# Patient Record
Sex: Male | Born: 1955 | Race: White | Hispanic: No | Marital: Married | Smoking: Former smoker
Health system: Southern US, Community
[De-identification: ages and names within clinical notes are randomized; demographics above are authoritative.]

## PROBLEM LIST (undated history)

## (undated) DIAGNOSIS — J449 Chronic obstructive pulmonary disease, unspecified: Secondary | ICD-10-CM

## (undated) DIAGNOSIS — R5382 Chronic fatigue, unspecified: Secondary | ICD-10-CM

## (undated) DIAGNOSIS — R42 Dizziness and giddiness: Secondary | ICD-10-CM

## (undated) DIAGNOSIS — D72829 Elevated white blood cell count, unspecified: Secondary | ICD-10-CM

## (undated) DIAGNOSIS — E785 Hyperlipidemia, unspecified: Secondary | ICD-10-CM

## (undated) DIAGNOSIS — I10 Essential (primary) hypertension: Secondary | ICD-10-CM

## (undated) DIAGNOSIS — T8859XA Other complications of anesthesia, initial encounter: Secondary | ICD-10-CM

## (undated) DIAGNOSIS — J189 Pneumonia, unspecified organism: Secondary | ICD-10-CM

## (undated) DIAGNOSIS — Z972 Presence of dental prosthetic device (complete) (partial): Secondary | ICD-10-CM

## (undated) DIAGNOSIS — I2510 Atherosclerotic heart disease of native coronary artery without angina pectoris: Secondary | ICD-10-CM

## (undated) DIAGNOSIS — R0600 Dyspnea, unspecified: Secondary | ICD-10-CM

## (undated) DIAGNOSIS — I219 Acute myocardial infarction, unspecified: Secondary | ICD-10-CM

## (undated) DIAGNOSIS — I509 Heart failure, unspecified: Secondary | ICD-10-CM

## (undated) HISTORY — DX: Atherosclerotic heart disease of native coronary artery without angina pectoris: I25.10

## (undated) HISTORY — PX: APPENDECTOMY: SHX54

## (undated) HISTORY — DX: Chronic fatigue, unspecified: R53.82

## (undated) HISTORY — PX: COLONOSCOPY: SHX174

## (undated) HISTORY — DX: Essential (primary) hypertension: I10

## (undated) HISTORY — PX: HERNIA REPAIR: SHX51

## (undated) HISTORY — DX: Elevated white blood cell count, unspecified: D72.829

## (undated) HISTORY — PX: NECK SURGERY: SHX720

## (undated) HISTORY — DX: Hyperlipidemia, unspecified: E78.5

---

## 1999-07-06 ENCOUNTER — Ambulatory Visit (HOSPITAL_COMMUNITY): Admission: RE | Admit: 1999-07-06 | Discharge: 1999-07-06 | Payer: Self-pay | Admitting: Neurosurgery

## 1999-07-06 ENCOUNTER — Encounter: Payer: Self-pay | Admitting: Neurosurgery

## 2000-06-13 ENCOUNTER — Encounter: Payer: Self-pay | Admitting: Emergency Medicine

## 2000-06-13 ENCOUNTER — Inpatient Hospital Stay (HOSPITAL_COMMUNITY): Admission: EM | Admit: 2000-06-13 | Discharge: 2000-06-15 | Payer: Self-pay | Admitting: Emergency Medicine

## 2000-08-28 ENCOUNTER — Ambulatory Visit (HOSPITAL_COMMUNITY): Admission: RE | Admit: 2000-08-28 | Discharge: 2000-08-28 | Payer: Self-pay | Admitting: Cardiology

## 2001-01-22 ENCOUNTER — Encounter: Payer: Self-pay | Admitting: Emergency Medicine

## 2001-01-22 ENCOUNTER — Inpatient Hospital Stay (HOSPITAL_COMMUNITY): Admission: EM | Admit: 2001-01-22 | Discharge: 2001-01-25 | Payer: Self-pay | Admitting: Emergency Medicine

## 2002-02-16 ENCOUNTER — Inpatient Hospital Stay (HOSPITAL_COMMUNITY): Admission: EM | Admit: 2002-02-16 | Discharge: 2002-03-06 | Payer: Self-pay

## 2002-02-16 ENCOUNTER — Encounter: Payer: Self-pay | Admitting: *Deleted

## 2002-02-17 ENCOUNTER — Encounter: Payer: Self-pay | Admitting: Cardiology

## 2002-02-18 ENCOUNTER — Encounter: Payer: Self-pay | Admitting: Cardiothoracic Surgery

## 2002-02-19 ENCOUNTER — Encounter: Payer: Self-pay | Admitting: Cardiothoracic Surgery

## 2002-02-20 ENCOUNTER — Encounter: Payer: Self-pay | Admitting: Cardiothoracic Surgery

## 2002-02-21 ENCOUNTER — Encounter (INDEPENDENT_AMBULATORY_CARE_PROVIDER_SITE_OTHER): Payer: Self-pay | Admitting: *Deleted

## 2002-02-21 ENCOUNTER — Encounter: Payer: Self-pay | Admitting: Cardiothoracic Surgery

## 2002-02-22 ENCOUNTER — Encounter: Payer: Self-pay | Admitting: Pulmonary Disease

## 2002-02-22 ENCOUNTER — Encounter: Payer: Self-pay | Admitting: Cardiothoracic Surgery

## 2002-02-22 HISTORY — PX: CORONARY ARTERY BYPASS GRAFT: SHX141

## 2002-02-23 ENCOUNTER — Encounter: Payer: Self-pay | Admitting: Pulmonary Disease

## 2002-02-23 ENCOUNTER — Encounter: Payer: Self-pay | Admitting: Cardiothoracic Surgery

## 2002-02-24 ENCOUNTER — Encounter: Payer: Self-pay | Admitting: Pulmonary Disease

## 2002-02-25 ENCOUNTER — Encounter: Payer: Self-pay | Admitting: Surgery

## 2002-02-25 ENCOUNTER — Encounter: Payer: Self-pay | Admitting: Pulmonary Disease

## 2002-02-26 ENCOUNTER — Encounter: Payer: Self-pay | Admitting: Pulmonary Disease

## 2002-02-27 ENCOUNTER — Encounter: Payer: Self-pay | Admitting: Cardiothoracic Surgery

## 2002-02-28 ENCOUNTER — Encounter: Payer: Self-pay | Admitting: Cardiothoracic Surgery

## 2002-03-01 ENCOUNTER — Encounter: Payer: Self-pay | Admitting: Cardiothoracic Surgery

## 2002-03-02 ENCOUNTER — Encounter: Payer: Self-pay | Admitting: Cardiothoracic Surgery

## 2002-03-03 ENCOUNTER — Encounter (INDEPENDENT_AMBULATORY_CARE_PROVIDER_SITE_OTHER): Payer: Self-pay | Admitting: *Deleted

## 2002-03-24 ENCOUNTER — Encounter: Admission: RE | Admit: 2002-03-24 | Discharge: 2002-03-24 | Payer: Self-pay | Admitting: Cardiothoracic Surgery

## 2002-03-24 ENCOUNTER — Encounter: Payer: Self-pay | Admitting: Cardiothoracic Surgery

## 2002-04-12 ENCOUNTER — Encounter (HOSPITAL_COMMUNITY): Admission: RE | Admit: 2002-04-12 | Discharge: 2002-05-30 | Payer: Self-pay | Admitting: *Deleted

## 2002-06-10 ENCOUNTER — Encounter: Payer: Self-pay | Admitting: Emergency Medicine

## 2002-06-10 ENCOUNTER — Inpatient Hospital Stay (HOSPITAL_COMMUNITY): Admission: EM | Admit: 2002-06-10 | Discharge: 2002-06-14 | Payer: Self-pay

## 2002-06-12 ENCOUNTER — Encounter: Payer: Self-pay | Admitting: Cardiovascular Disease

## 2002-07-24 ENCOUNTER — Encounter: Payer: Self-pay | Admitting: *Deleted

## 2002-07-24 ENCOUNTER — Emergency Department (HOSPITAL_COMMUNITY): Admission: EM | Admit: 2002-07-24 | Discharge: 2002-07-24 | Payer: Self-pay | Admitting: Emergency Medicine

## 2002-12-01 ENCOUNTER — Encounter: Payer: Self-pay | Admitting: *Deleted

## 2002-12-01 ENCOUNTER — Ambulatory Visit (HOSPITAL_COMMUNITY): Admission: RE | Admit: 2002-12-01 | Discharge: 2002-12-01 | Payer: Self-pay | Admitting: *Deleted

## 2003-04-07 ENCOUNTER — Emergency Department (HOSPITAL_COMMUNITY): Admission: EM | Admit: 2003-04-07 | Discharge: 2003-04-07 | Payer: Self-pay | Admitting: Emergency Medicine

## 2003-07-06 ENCOUNTER — Encounter (INDEPENDENT_AMBULATORY_CARE_PROVIDER_SITE_OTHER): Payer: Self-pay | Admitting: Cardiology

## 2003-07-06 ENCOUNTER — Inpatient Hospital Stay (HOSPITAL_COMMUNITY): Admission: EM | Admit: 2003-07-06 | Discharge: 2003-07-08 | Payer: Self-pay | Admitting: *Deleted

## 2004-04-01 ENCOUNTER — Observation Stay (HOSPITAL_COMMUNITY): Admission: EM | Admit: 2004-04-01 | Discharge: 2004-04-02 | Payer: Self-pay

## 2004-09-10 ENCOUNTER — Emergency Department (HOSPITAL_COMMUNITY): Admission: EM | Admit: 2004-09-10 | Discharge: 2004-09-10 | Payer: Self-pay | Admitting: Emergency Medicine

## 2004-11-22 ENCOUNTER — Observation Stay (HOSPITAL_COMMUNITY): Admission: EM | Admit: 2004-11-22 | Discharge: 2004-11-23 | Payer: Self-pay | Admitting: Emergency Medicine

## 2004-11-25 ENCOUNTER — Observation Stay (HOSPITAL_COMMUNITY): Admission: EM | Admit: 2004-11-25 | Discharge: 2004-11-26 | Payer: Self-pay | Admitting: Emergency Medicine

## 2004-12-13 ENCOUNTER — Ambulatory Visit (HOSPITAL_COMMUNITY): Admission: RE | Admit: 2004-12-13 | Discharge: 2004-12-13 | Payer: Self-pay | Admitting: Neurosurgery

## 2004-12-23 ENCOUNTER — Inpatient Hospital Stay (HOSPITAL_COMMUNITY): Admission: RE | Admit: 2004-12-23 | Discharge: 2004-12-24 | Payer: Self-pay | Admitting: Neurosurgery

## 2005-10-21 ENCOUNTER — Observation Stay (HOSPITAL_COMMUNITY): Admission: EM | Admit: 2005-10-21 | Discharge: 2005-10-22 | Payer: Self-pay | Admitting: Emergency Medicine

## 2005-11-21 ENCOUNTER — Ambulatory Visit (HOSPITAL_COMMUNITY): Admission: RE | Admit: 2005-11-21 | Discharge: 2005-11-22 | Payer: Self-pay | Admitting: *Deleted

## 2006-09-23 IMAGING — CT CT ANGIO CHEST
3 of 9 series · 14 of 32 positions shown · IV contrast ([ID] OMNI 300)
Comparison: none

CLINICAL DATA: 48 year old with chest pain and shortness of breath.  History of bypass surgery.
 CT ANGIO CHEST WITH CONTRAST:
TECHNIQUE: Multidetector CT imaging of the chest was performed according to the protocol for detection of pulmonary embolism during IV bolus injection of 120 ml Omnipaque 300.  Coronal and sagittal plane images were also generated.
TECHNIQUE: Multidetector CT imaging of the abdomen was performed during IV bolus injection of 120 ml Omnipaque 300.  Coronal and sagittal plane images were also generated.

[Series 3: aortic disection · axial · 0.64mm/px · z∈[-314,-172]mm · 3 of 173 slices shown]
[im 58/173  lung]
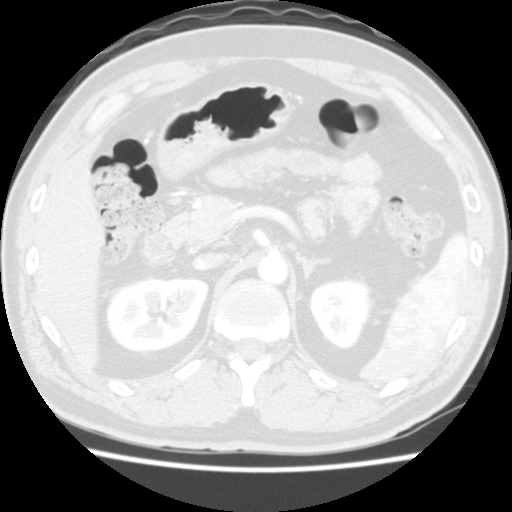
[im 82/173  lung]
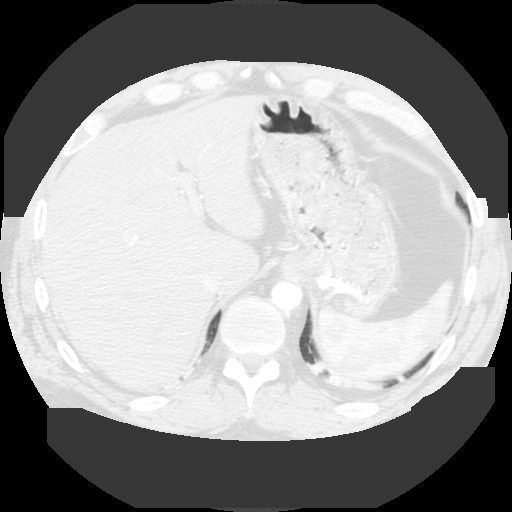
[im 115/173  lung]
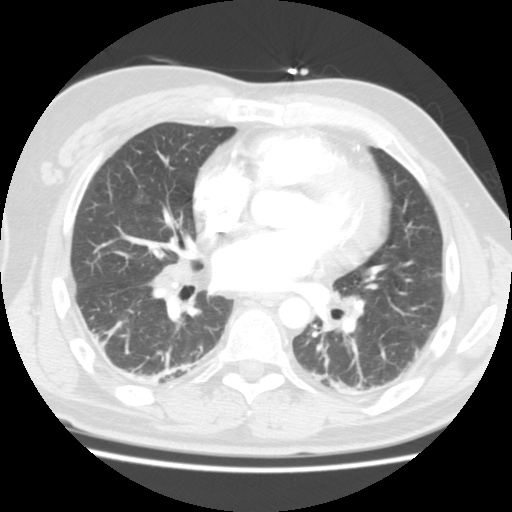

[Series 4: recon 2: aortic disection · axial · 0.64mm/px · z∈[-254,-147]mm · 2 of 97 slices shown]
[im 6/97  lung]
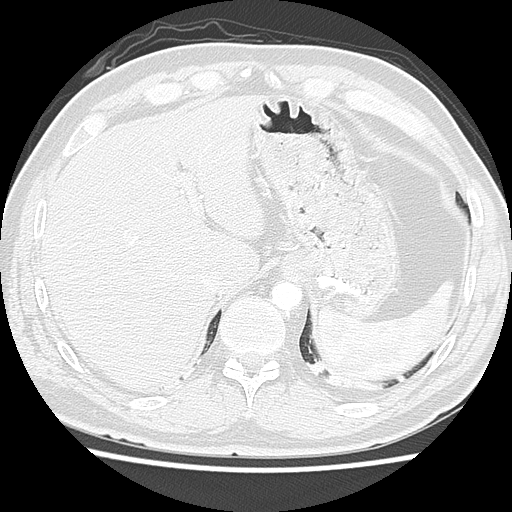
[im 49/97  lung]
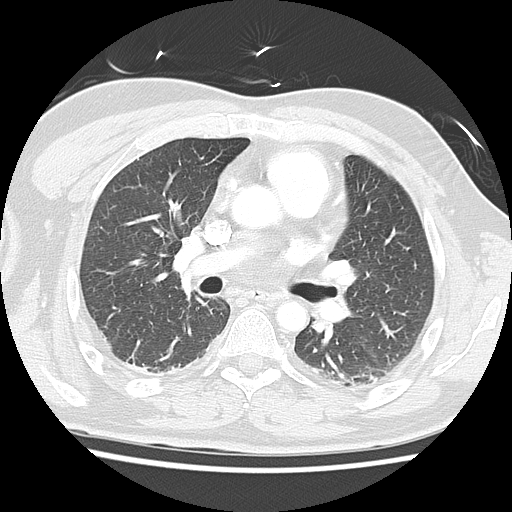

[Series 5: recon 3: aortic disection · axial · 0.64mm/px · z∈[-412,-76]mm · 9 of 347 slices shown]
[im 39/347  lung]
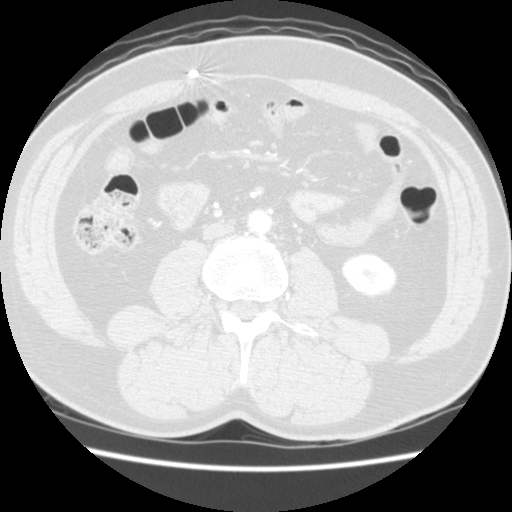
[im 77/347  mediastinal]
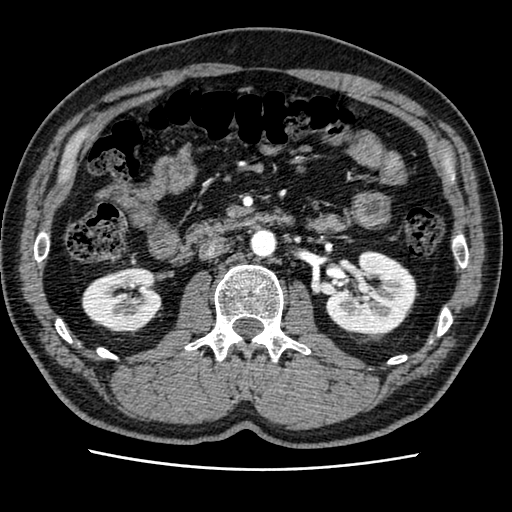
[im 116/347  lung]
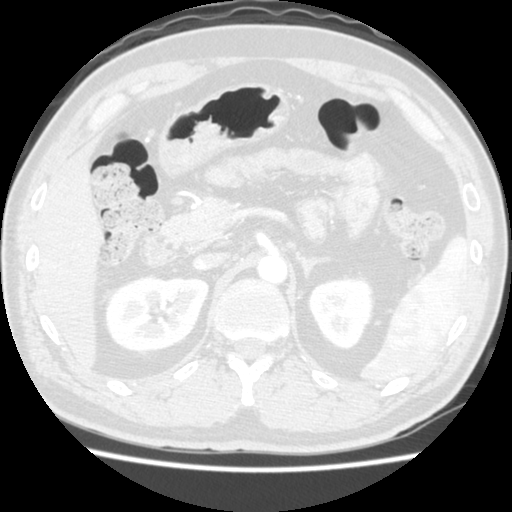
[im 154/347  mediastinal]
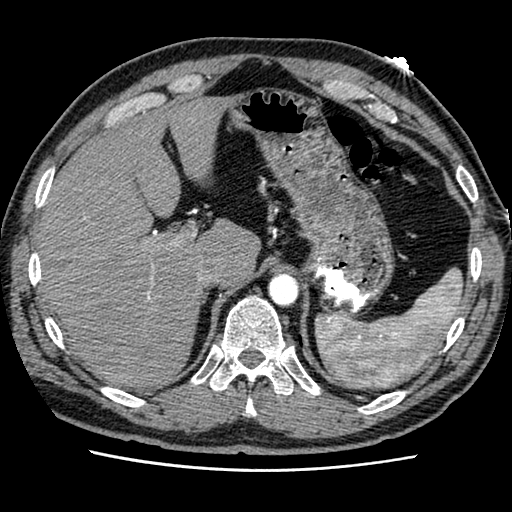
[im 165/347  lung]
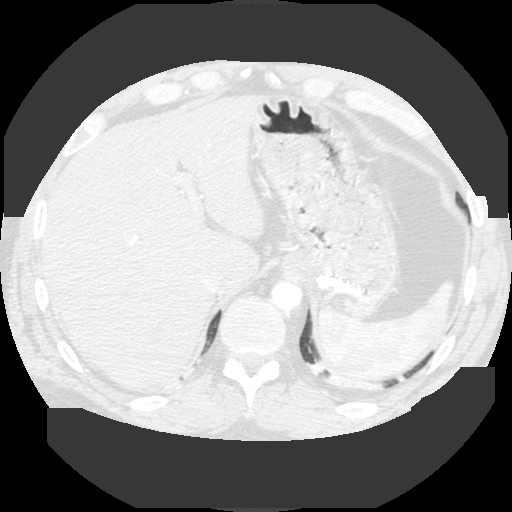
[im 193/347  mediastinal]
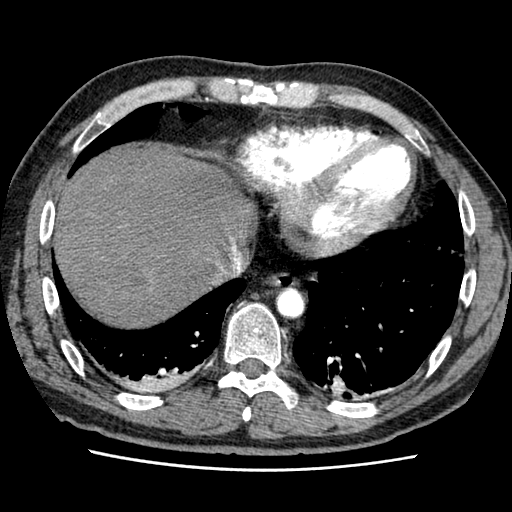
[im 231/347  lung]
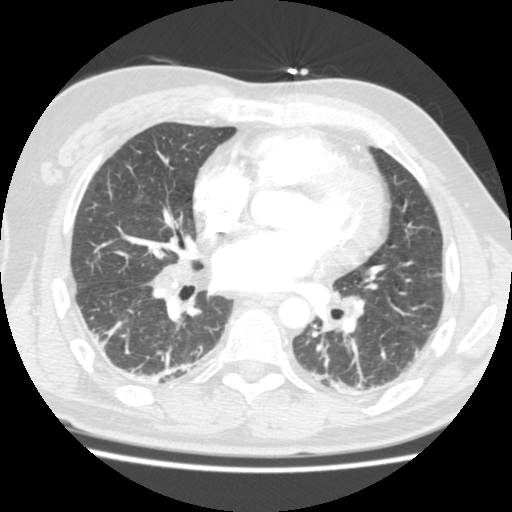
[im 270/347  mediastinal]
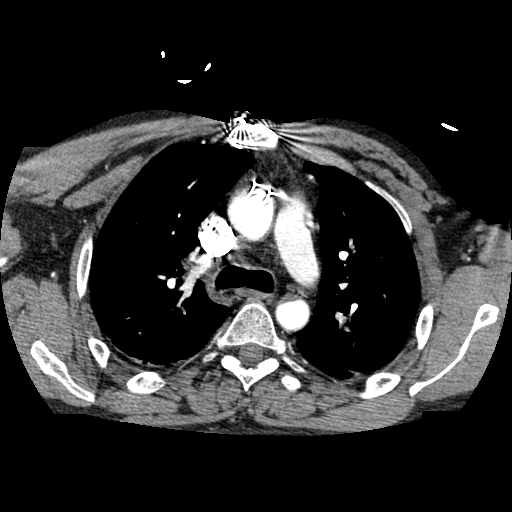
[im 308/347  lung]
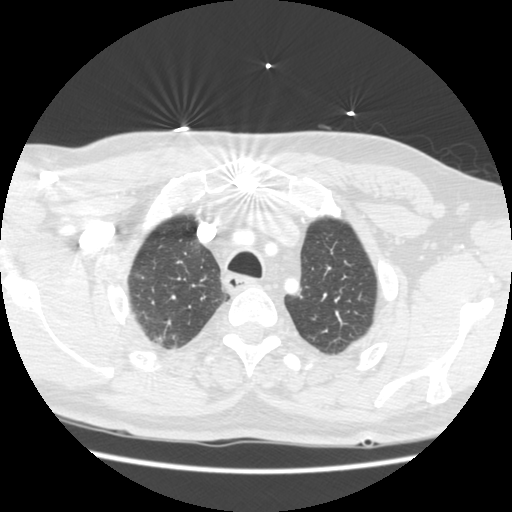

[14 of 32 positions shown; findings below may reference images not displayed]

FINDINGS: The chest wall, soft tissues, and bony structures are unremarkable.  There are surgical changes related to bypass surgery.  The heart is upper limits of normal in size.  No pericardial effusion.  No mediastinal adenopathy.  There are some borderline enlarged right hilar lymph nodes.  The pulmonary artery tree is well opacified.  No filling defects are seen to suggest pulmonary emboli.  The aorta is normal in caliber.  No evidence for aneurysm or dissection.  The esophagus is grossly normal.  Examination of the lung parenchyma demonstrates areas of dependent subpleural atelectasis.  No edema, infiltrates, or effusions.  No pulmonary masses or nodules.
IMPRESSION: 1.  No CT evidence for pulmonary emboli.
 2.  No evidence for aortic aneurysm or dissection.  There is a small area of mural thrombus involving the upper aspect of the descending thoracic aorta.
 3.  Borderline right hilar lymph nodes.
 4.  No significant pulmonary findings.  There is minimal dependent atelectasis.
 CT ANGIO ABDOMEN WITH CONTRAST:
FINDINGS: The aorta is normal in caliber.  No evidence for dissection.  The major branch vessels are normal.  There are single renal arteries bilaterally.  No significant atherosclerotic calcifications are seen.  The liver, spleen, pancreas, adrenal glands, and kidneys demonstrated no significant abnormalities.  The stomach, duodenum, small bowel, and colon are grossly normal but the study is limited without oral contrast.  There is atherosclerotic calcification at the aortic bifurcation and in the proximal iliac arteries but no aneurysm.  A circumaortic left renal vein is noted.
IMPRESSION: 1.  No evidence for abdominal aortic dissection or aneurysm.
 2.  No acute abdominal findings.

## 2007-06-28 ENCOUNTER — Encounter: Admission: RE | Admit: 2007-06-28 | Discharge: 2007-06-28 | Payer: Self-pay | Admitting: *Deleted

## 2007-07-01 ENCOUNTER — Ambulatory Visit (HOSPITAL_COMMUNITY): Admission: RE | Admit: 2007-07-01 | Discharge: 2007-07-01 | Payer: Self-pay | Admitting: *Deleted

## 2008-03-22 ENCOUNTER — Ambulatory Visit (HOSPITAL_COMMUNITY): Admission: RE | Admit: 2008-03-22 | Discharge: 2008-03-23 | Payer: Self-pay | Admitting: Neurosurgery

## 2009-04-13 ENCOUNTER — Encounter: Admission: RE | Admit: 2009-04-13 | Discharge: 2009-04-13 | Payer: Self-pay | Admitting: Family Medicine

## 2009-09-08 ENCOUNTER — Encounter: Payer: Self-pay | Admitting: Cardiovascular Disease

## 2009-09-08 LAB — CONVERTED CEMR LAB
ALT: 32 units/L
AST: 22 units/L
Albumin: 4.9 g/dL
Alkaline Phosphatase: 88 units/L
BUN: 12 mg/dL
CO2: 21 meq/L
Calcium: 9.2 mg/dL
Chloride: 103 meq/L
Cholesterol: 128 mg/dL
Creatinine, Ser: 0.81 mg/dL
Glucose, Bld: 93 mg/dL
HDL: 49 mg/dL
LDL Cholesterol: 57 mg/dL
Potassium: 3.9 meq/L
Sodium: 138 meq/L
Total Bilirubin: 0.6 mg/dL
Total Protein: 7.3 g/dL
Triglyceride fasting, serum: 108 mg/dL

## 2009-09-14 ENCOUNTER — Encounter: Payer: Self-pay | Admitting: Cardiovascular Disease

## 2009-11-06 ENCOUNTER — Telehealth: Payer: Self-pay | Admitting: Cardiovascular Disease

## 2009-11-07 ENCOUNTER — Encounter: Payer: Self-pay | Admitting: Cardiovascular Disease

## 2009-12-04 ENCOUNTER — Telehealth: Payer: Self-pay | Admitting: Cardiovascular Disease

## 2010-01-04 ENCOUNTER — Ambulatory Visit: Payer: Self-pay | Admitting: Cardiovascular Disease

## 2010-01-04 DIAGNOSIS — I25810 Atherosclerosis of coronary artery bypass graft(s) without angina pectoris: Secondary | ICD-10-CM | POA: Insufficient documentation

## 2010-01-04 DIAGNOSIS — I10 Essential (primary) hypertension: Secondary | ICD-10-CM | POA: Insufficient documentation

## 2010-01-04 DIAGNOSIS — E785 Hyperlipidemia, unspecified: Secondary | ICD-10-CM | POA: Insufficient documentation

## 2010-03-07 ENCOUNTER — Telehealth: Payer: Self-pay | Admitting: Cardiovascular Disease

## 2010-04-02 ENCOUNTER — Telehealth: Payer: Self-pay | Admitting: Cardiovascular Disease

## 2010-04-08 ENCOUNTER — Telehealth: Payer: Self-pay | Admitting: Cardiovascular Disease

## 2010-05-09 ENCOUNTER — Telehealth: Payer: Self-pay | Admitting: Cardiovascular Disease

## 2010-05-09 DIAGNOSIS — R079 Chest pain, unspecified: Secondary | ICD-10-CM | POA: Insufficient documentation

## 2010-05-09 DIAGNOSIS — R42 Dizziness and giddiness: Secondary | ICD-10-CM | POA: Insufficient documentation

## 2010-05-09 DIAGNOSIS — R61 Generalized hyperhidrosis: Secondary | ICD-10-CM | POA: Insufficient documentation

## 2010-05-15 ENCOUNTER — Telehealth (INDEPENDENT_AMBULATORY_CARE_PROVIDER_SITE_OTHER): Payer: Self-pay | Admitting: Radiology

## 2010-05-16 ENCOUNTER — Encounter: Payer: Self-pay | Admitting: Internal Medicine

## 2010-05-16 ENCOUNTER — Ambulatory Visit: Payer: Self-pay

## 2010-05-16 ENCOUNTER — Ambulatory Visit: Payer: Self-pay | Admitting: Internal Medicine

## 2010-05-16 ENCOUNTER — Encounter (HOSPITAL_COMMUNITY): Admission: RE | Admit: 2010-05-16 | Discharge: 2010-07-26 | Payer: Self-pay | Admitting: Cardiovascular Disease

## 2010-05-17 ENCOUNTER — Encounter: Payer: Self-pay | Admitting: Cardiovascular Disease

## 2010-06-13 ENCOUNTER — Telehealth: Payer: Self-pay | Admitting: Cardiovascular Disease

## 2010-07-26 ENCOUNTER — Ambulatory Visit: Payer: Self-pay | Admitting: Cardiovascular Disease

## 2010-08-30 ENCOUNTER — Encounter: Payer: Self-pay | Admitting: Cardiovascular Disease

## 2010-08-30 ENCOUNTER — Ambulatory Visit: Admission: RE | Admit: 2010-08-30 | Discharge: 2010-08-30 | Payer: Self-pay | Source: Home / Self Care

## 2010-09-02 LAB — CONVERTED CEMR LAB
ALT: 20 units/L (ref 0–53)
AST: 18 units/L (ref 0–37)
Albumin: 4.8 g/dL (ref 3.5–5.2)
Alkaline Phosphatase: 93 units/L (ref 39–117)
Bilirubin, Direct: 0.2 mg/dL (ref 0.0–0.3)
Cholesterol: 89 mg/dL (ref 0–200)
HDL: 36 mg/dL — ABNORMAL LOW (ref >39)
Indirect Bilirubin: 0.5 mg/dL (ref 0.0–0.9)
LDL Cholesterol: 38 mg/dL (ref 0–99)
Total Bilirubin: 0.7 mg/dL (ref 0.3–1.2)
Total CHOL/HDL Ratio: 2.5
Total Protein: 7.2 g/dL (ref 6.0–8.3)
Triglycerides: 76 mg/dL (ref <150)
VLDL: 15 mg/dL (ref 0–40)

## 2010-09-18 ENCOUNTER — Ambulatory Visit: Payer: Self-pay | Admitting: Internal Medicine

## 2010-09-25 ENCOUNTER — Ambulatory Visit: Payer: Self-pay | Admitting: Internal Medicine

## 2010-09-26 ENCOUNTER — Ambulatory Visit: Payer: Self-pay | Admitting: Physician Assistant

## 2010-09-26 NOTE — Progress Notes (Signed)
Summary: Vytorin   Phone Note Outgoing Call   Call placed by: Benedict Needy, RN,  April 08, 2010 10:24 AM Call placed to: Patient Summary of Call: Recived a call from pt's Pharmacy. Pt currently taking Vytorin 10/40 and Amolodipine 10mg . Due to new FDA guidelines pt needs to switch to crestor 40mg .  LMOM TCB  Initial call taken by: Benedict Needy, RN,  April 08, 2010 10:35 AM [facility] [phone][facility]  Follow-up for Phone Call        Spoke to pt's wife she is aware of mediction change. Will pick up samples and coupon at front desk.  Follow-up by: Benedict Needy, RN,  April 08, 2010 11:09 AM    New/Updated Medications: CRESTOR 40 MG TABS (ROSUVASTATIN CALCIUM) Take 1 tablet by mouth once a day Prescriptions: CRESTOR 40 MG TABS (ROSUVASTATIN CALCIUM) Take 1 tablet by mouth once a day  #30 x 6   Entered by:   Benedict Needy, RN   Authorized by:   Dossie Arbour MD   Signed by:   Benedict Needy, RN on 04/08/2010   Method used:   Electronically to        [facility] [phone]* (retail)       [address]       [place], [place]  [address]       Ph: [phone] or [phone]       Fax: [phone]   RxID:   [id number]

## 2010-09-26 NOTE — Progress Notes (Signed)
Summary: Nuc Pre-Procedure  Phone Note Call from Patient   Caller: Spouse Action Taken: Phone Call Completed Summary of Call: Reviewed information on Myoview Information Sheet (see scanned document for further details).  Spoke with pt's wife. Initial call taken by: Leonia Corona, RT-N,  May 15, 2010 9:50 AM     Nuclear Med Background Indications for Stress Test: Evaluation for Ischemia, Graft Patency   History: CABG, Echo, Heart Catheterization, Myocardial Perfusion Study  History Comments: H/O CABG; Patent grafts by Cath in '08 1/10- MPS- Mod. scar, no ischedia.EF= 46%  Symptoms: Chest Pain, Dizziness  Symptoms Comments: Left arm pain    Nuclear Pre-Procedure Cardiac Risk Factors: Hypertension, Lipids Height (in): 66

## 2010-09-26 NOTE — Progress Notes (Signed)
Summary: REfill  Phone Note Refill Request Message from:  Patient on November 06, 2009 4:52 PM  Patient called Metoperol 12.5 XL [facility] [place])  Initial call taken by: West Carbo,  November 06, 2009 4:52 PM Caller: Patient    New/Updated Medications: TOPROL XL 25 MG XR24H-TAB (METOPROLOL SUCCINATE) 1 tab by mouth daily Prescriptions: TOPROL XL 25 MG XR24H-TAB (METOPROLOL SUCCINATE) 1 tab by mouth daily  #30 x 6   Entered by:   Charlena Cross, RN, BSN   Authorized by:   Dossie Arbour MD   Signed by:   Charlena Cross, RN, BSN on 11/06/2009   Method used:   Electronically to        [facility] [id number]* (retail)       [address]       [place], [place]  [address]       Ph: [phone]       Fax: [phone]   RxID:   [id number]

## 2010-09-26 NOTE — Letter (Signed)
Summary: PHI  PHI   Imported By: Harlon Flor 01/07/2010 11:57:17  _____________________________________________________________________  External Attachment:    Type:   Image     Comment:   External Document

## 2010-09-26 NOTE — Letter (Signed)
Summary: [facility] [employer] [facility] at [facility] [address]   [place], [place] [address]   Phone: [phone]  Fax: [phone]      May 17, 2010 MRN: [id number]   [facility] [place] [address] [place], [place]  [address]   Dear Mr. MESTAS,  Your test ordered by Selena Batten has been reviewed by your physician (or physician assistant) and was found to be normal or stable. Your physician (or physician assistant) felt no changes were needed at this time.  ____ Echocardiogram  __x__ Cardiac Stress Test  ____ Lab Work  ____ Peripheral vascular study of arms, legs or neck  ____ CT scan or X-ray  ____ Lung or Breathing test  ____ Other:   Thank you.   Benedict Needy, RN    Dossie Arbour, MD

## 2010-09-26 NOTE — Progress Notes (Signed)
Summary: SAMPLES  Phone Note Call from Patient Call back at Home Phone [phone]   Caller: SELF Call For: [facility] Summary of Call: WOULD LIKE TO KNOW IF THEY CAN HAVE SAMPLES OF ANY OF HIS MEDS Initial call taken by: Harlon Flor,  April 02, 2010 12:40 PM  Follow-up for Phone Call        spoke to pt's wife will leave effient samples.     New/Updated Medications: EFFIENT 10 MG TABS (PRASUGREL HCL) Take one tablet by mouth daily Prescriptions: EFFIENT 10 MG TABS (PRASUGREL HCL) Take one tablet by mouth daily  #28 x 0   Entered by:   Benedict Needy, RN   Authorized by:   Dossie Arbour MD   Signed by:   Benedict Needy, RN on 04/02/2010   Method used:   Samples Given   RxID:   [id number]

## 2010-09-26 NOTE — Progress Notes (Signed)
Summary: SAMPLES  Phone Note Call from Patient Call back at Home Phone [phone]   Caller: SELF Call For: [facility] Summary of Call: WOULD LIKE SAMPLES OF CRESTOR 20 MG AND EFFIENT 10 MG Initial call taken by: Harlon Flor,  June 13, 2010 8:27 AM  Follow-up for Phone Call        Samples of crestor 20 mg and effient 10 mg given. Follow-up by: Bishop Dublin, CMA,  June 13, 2010 8:43 AM

## 2010-09-26 NOTE — Assessment & Plan Note (Signed)
Summary: np6   Primary Provider:  Pat Kocher, MD  CC:  Chest pain; SOB; Edema; REQUESTING samples of Rx.  History of Present Illness:   55 year old gentleman with a history of coronary artery disease, bypass surgery, hyperlipidemia and hypertension who is known to me from [facility] who presents to reestablish care.  Overall [name] states that he is about the same. He does continue to have short episodes of chest discomfort typically at rest that he describes as a tingling, pain sometimes associated with flushing and shortness of breath. His symptoms last for several minutes. These have been chronic and he typically does not take nitroglycerin for them. His wife states that she can tell when he is having an episode. 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  Cardiac Cath Procedure date:  07/01/2007  1. Significant three-vessel CAD.   2. Patent LIMA to LAD with no severe significant disease in the LAD       beyond IMA insertion.   3. Patent vein graft to the diagonal with no disease beyond the       diagonal graft insertion.   4. Patent vein graft to the obtuse marginal, with no disease beyond       the graft insertion.   5. Patent vein graft to the right coronary artery with mild 50%       percent narrowing of the right coronary artery beyond the graft       insertion.   6. Mildly depressed left ventricular systolic function.  Wall motion       abnormalities noted above.   Problems Prior to Update: None  Medications Prior to Update: 1)  Toprol Xl 25 Mg Xr24h-Tab (Metoprolol Succinate) .[name].. 1 Tab By Mouth Daily 2)  Exforge 10-320 Mg Tabs (Amlodipine Besylate-Valsartan) .[name].. 1 Tab By Mouth Daily  Current Medications (verified): 1)  Toprol Xl 25 Mg Xr24h-Tab (Metoprolol Succinate) .... Take 1/2  Tab By Mouth  Daily 2)  Exforge 10-320 Mg Tabs (Amlodipine Besylate-Valsartan) .[name].. 1 Tab By Mouth Daily 3)  Aspirin 81 Mg Tbec (Aspirin) .... Take One Tablet By Mouth Daily 4)  Vytorin 10-40 Mg Tabs (Ezetimibe-Simvastatin) .... Take One Tablet By Mouth Dailyat Bedtime 5)  Plavix 75 Mg Tabs (Clopidogrel Bisulfate) .... Take One Tablet By Mouth Daily 6)  Nitrostat 0.4 Mg Subl (Nitroglycerin) .[name].. 1 Tablet Under Tongue At Onset of Chest Pain; You May Repeat Every 5 Minutes For Up To 3 Doses. 7)  Viagra 100 Mg Tabs (Sildenafil Citrate) .... Take 1/2 By Mouth As Needed  Allergies (verified): 1)  ! Penicillin 2)  ! Heparin  Past History:  Past Medical History: Last updated: 11/07/2009 CAD chronic fatique  Past Surgical History: Last updated: 11/07/2009 CABG  Review of Systems       The patient complains of chest pain.  The patient denies fever, weight loss, weight gain, vision loss, decreased hearing, hoarseness, syncope, dyspnea on exertion, peripheral edema, prolonged cough, abdominal pain, incontinence, muscle weakness, depression, and enlarged lymph nodes.    Vital Signs:  Patient profile:   55 year old male Height:      66 inches Weight:      197 pounds BMI:     31.91 Pulse rate:   63 / minute BP sitting:   128 / 80  (left arm) Cuff size:  regular  Vitals Entered By: Stanton Kidney, EMT-P (Jan 04, 2010 10:17 AM)  Physical Exam  General:  Well-appearing middle-aged gentleman in no apparent distress, HEENT exam is benign, oropharynx is clear, neck is supple with no JVP or carotid bruits, heart sounds are regular with S1-S2 and no murmurs appreciated, lungs are clear to auscultation with no wheezes Rales, abdominal exam is benign, no significant lower extremity edema, neurologic exam is grossly nonfocal, skin is warm and dry, pulses are equal and symmetrical in his upper and lower extremities.    EKG  Procedure date:  01/04/2010  Findings:      EKG shows normal sinus rhythm with rate  63 beats per minute, and no significant ST or T wave changes.  Impression & Recommendations:  Problem # 1:  CAD, ARTERY BYPASS GRAFT (ICD-414.04) history of bypass surgery, repeat catheterization in 2008 showing patent grafts he did have 50% RCA lesion distal to the RCA graft insertion. He does have chronic mild episodes of chest pain of these are typically at rest and not with exertion. These episodes seem chronic and we will manage him medically. He is on aspirin. He is a nonsmoker.  He did comment on the cost of the Plavix. We will give him a prescription for effient 10 mg daily  and a copay card to see if  this may be more affordable for him.  The following medications were removed from the medication list:    Exforge 10-320 Mg Tabs (Amlodipine besylate-valsartan) .[name]... 1 tab by mouth daily His updated medication list for this problem includes:    Toprol Xl 25 Mg Xr24h-tab (Metoprolol succinate) .[name]... Take 1/2  tab by mouth daily    Aspirin 81 Mg Tbec (Aspirin) .[name]... Take one tablet by mouth daily    Nitrostat 0.4 Mg Subl (Nitroglycerin) .[name]... 1 tablet under tongue at onset of chest pain; you may repeat every 5 minutes for up to 3 doses.    Amlodipine Besylate 10 Mg Tabs (Amlodipine besylate) .[name]... Take one tablet by mouth daily    Effient 10 Mg Tabs (Prasugrel hcl) .[name]... Take one tablet by mouth daily  Problem # 2:  HYPERTENSION, BENIGN (ICD-401.1) His blood pressure well controlled though he does comment on the cost of his medications. We'll change his X. Forge 2 amlodipine 10/losartan 100 mg daily.  The following medications were removed from the medication list:    Exforge 10-320 Mg Tabs (Amlodipine besylate-valsartan) .[name]... 1 tab by mouth daily His updated medication list for this problem includes:    Toprol Xl 25 Mg Xr24h-tab (Metoprolol succinate) .[name]... Take 1/2  tab by mouth daily    Aspirin 81 Mg Tbec (Aspirin) .[name]... Take one tablet by mouth daily    Amlodipine Besylate 10 Mg  Tabs (Amlodipine besylate) .[name]... Take one tablet by mouth daily    Losartan Potassium 100 Mg Tabs (Losartan potassium) .[name]... Take 1 tablet by mouth once a day  Problem # 3:  HYPERLIPIDEMIA-MIXED (ICD-272.4) His cholesterol is well controlled he states that he is Vytorin samples at home.  His updated medication list for this problem includes:    Vytorin 10-40 Mg Tabs (Ezetimibe-simvastatin) .[name]... Take one tablet by mouth dailyat bedtime  Patient Instructions: 1)  Your physician recommends that you schedule a follow-up appointment in: 6 months 2)  Your physician has recommended you make the following change in your medication: Discontinue Plavix and Exforge.  Start taking Amlodipine 10mg  once daily and Losartan 100mg  once daily. Start taking Effient 10mg   once daily. Prescriptions: EFFIENT 10 MG TABS (PRASUGREL HCL) Take one tablet by mouth daily  #30 x 6   Entered by:   Cloyde Reams RN   Authorized by:   Dossie Arbour MD   Signed by:   Cloyde Reams RN on 01/04/2010   Method used:   [facility] to        [facility] [phone]* (retail)       [address]       [place], [place]  [address]       Ph: [phone] or [phone]       Fax: [phone]   RxID:   [id number] LOSARTAN POTASSIUM 100 MG TABS (LOSARTAN POTASSIUM) Take 1 tablet by mouth once a day  #30 x 6   Entered by:   Cloyde Reams RN   Authorized by:   Dossie Arbour MD   Signed by:   Cloyde Reams RN on 01/04/2010   Method used:   Electronically to        [facility] [phone]* (retail)       [address]       [place], [place]  [address]       Ph: [phone] or [phone]       Fax: [phone]   RxID:   [id number] AMLODIPINE BESYLATE 10 MG TABS (AMLODIPINE BESYLATE) Take one tablet by mouth daily  #30 x 6   Entered by:   Cloyde Reams RN   Authorized by:   Dossie Arbour MD   Signed by:   Cloyde Reams RN on 01/04/2010   Method used:   Electronically  to        [facility] [phone]* (retail)       [address]       [place], [place]  [address]       Ph: [phone] or [phone]       Fax: [phone]   RxID:   [id number]

## 2010-09-26 NOTE — Progress Notes (Signed)
Summary: RX  Phone Note Refill Request Call back at Home Phone [phone] Message from:  Patient on December 04, 2009 9:26 AM  [facility]  Initial call taken by: Harlon Flor,  December 04, 2009 9:26 AM  Follow-up for Phone Call        left message for pt to call back, we have no records need to make sure pt is taking this medication.     New/Updated Medications: EXFORGE 10-320 MG TABS (AMLODIPINE BESYLATE-VALSARTAN) 1 tab by mouth daily Prescriptions: EXFORGE 10-320 MG TABS (AMLODIPINE BESYLATE-VALSARTAN) 1 tab by mouth daily  #30 x 6   Entered by:   Charlena Cross, RN, BSN   Authorized by:   Dossie Arbour MD   Signed by:   Charlena Cross, RN, BSN on 12/05/2009   Method used:   Electronically to        [facility] [phone]* (retail)       [address]       [place], [place]  [address]       Ph: [phone] or [phone]       Fax: [phone]   RxID:   [id number]

## 2010-09-26 NOTE — Progress Notes (Signed)
Summary: Pain  Phone Note Call from Patient Call back at [phone]   Caller: Spouse Call For: [facility] Summary of Call: Pt's wife called and stated that the pt has been having problems. When I started asking detailed questions she gave me his cell # so I could talk to him directly. I called pt c/o left arm and shoulder pain with tingling down to finger tips, breaking out in a sweat without excertions and dizziness. Pt has Hx of CAD and CABG.  Initial call taken by: Benedict Needy, RN,  May 09, 2010 11:19 AM  Follow-up for Phone Call        Pt's last stress was >2 years ago. Asked pt how this pain and tingling was different than the symptoms he described at his last visit in may.  He said that they are happening more often and now he is starting to be dizzy with them. Went over the instructions for Sherrine Maples to schedule and call pt.  Follow-up by: Benedict Needy, RN,  May 09, 2010 11:54 AM  New Problems: DIZZINESS (ICD-780.4) CHEST PAIN (ICD-786.50) DIAPHORESIS (ICD-780.8)   New Problems: DIZZINESS (ICD-780.4) CHEST PAIN (ICD-786.50) DIAPHORESIS (ICD-780.8)

## 2010-09-26 NOTE — Progress Notes (Signed)
Summary: Questions about Medications  Phone Note Call from Patient Call back at Home Phone [phone]   Caller: Spouse Call For: [facility] Summary of Call: Patients wife called and patient is due for refill of the following new medications that were started at his last office visit: Losartin amlodipine, effient .  Before refilling she noted that since patient had starting taking these new medications he has noticed increased bruising overall.  Wife would like to get Dr. Windell Hummingbird opinion before they refill these medications. Initial call taken by: West Carbo,  March 07, 2010 12:17 PM  Follow-up for Phone Call        informed her that Effient is a blood thinner similar to plavix and that bruising is probably going to happen but to keep an eye on them and especially if they are in the torso region and if they don't go away or get really bad then call. Follow-up by: Hardin Negus, RMA,  March 07, 2010 2:05 PM

## 2010-09-26 NOTE — Assessment & Plan Note (Signed)
Summary: Cardiology Nuclear Testing  Nuclear Med Background Indications for Stress Test: Evaluation for Ischemia, Graft Patency   History: CABG, Echo, Heart Catheterization, Myocardial Perfusion Study  History Comments: H/O CABG; Patent grafts by Cath in '08 1/10- MPS- Mod. scar, no ischedia.EF= 46%  Symptoms: Chest Pain, Dizziness  Symptoms Comments: Left arm pain    Nuclear Pre-Procedure Cardiac Risk Factors: Hypertension, Lipids Caffeine/Decaff Intake: None NPO After: 6:45 PM Lungs: clear IV 0.9% NS with Angio Cath: 20g     IV Site: R Antecubital IV Started by: Irean Hong, RN Chest Size (in) 40     Height (in): 66 Weight (lb): 197 BMI: 31.91 Tech Comments: Held toprol 48 hrs.  Nuclear Med Study 1 or 2 day study:  1 day     Stress Test Type:  Eugenie Birks Reading MD:  Arvilla Meres, MD     Referring MD:  T.Gollan Resting Radionuclide:  Technetium 28m Tetrofosmin     Resting Radionuclide Dose:  11.0 mCi  Stress Radionuclide:  Technetium 80m Tetrofosmin     Stress Radionuclide Dose:  33.0 mCi   Stress Protocol   Lexiscan: 0.4 mg   Stress Test Technologist:  Milana Na, EMT-P     Nuclear Technologist:  Domenic Polite, CNMT  Rest Procedure  Myocardial perfusion imaging was performed at rest 45 minutes following the intravenous administration of Technetium 6m Tetrofosmin.  Stress Procedure  The patient received IV Lexiscan 0.4 mg over 15-seconds.  Technetium 6m Tetrofosmin injected at 30-seconds.  There were no significant changes, + sob, and chest tightness with infusion.  Quantitative spect images were obtained after a 45 minute delay.  QPS Raw Data Images:  Normal; no motion artifact; normal heart/lung ratio. Stress Images:  Decreased uptake in the inferior wall. Rest Images:  Decreased uptake in the inferior wall. Subtraction (SDS):  There is a fixed defect that is most consistent with a previous infarction. No significant ischemia. Transient Ischemic  Dilatation:  1.13  (Normal <1.22)  Lung/Heart Ratio:  .35  (Normal <0.45)  Quantitative Gated Spect Images QGS EDV:  148 ml QGS ESV:  84 ml QGS EF:  43 % QGS cine images:  Hypokinesis of inferior wall and septum  Findings Abormal nuclear study  Evidence for inferior infarct  Evidence for LV Dysfunction LV Dysfunction    Overall Impression  Exercise Capacity: Lexiscan with no exercise. ECG Impression: No significant ST segment change with Lexiscan. Overall Impression: Abnormal stress nuclear study. Overall Impression Comments: Previous inferior infarc. No ischemia.  Appended Document: Cardiology Nuclear Testing Study shows no ischemia,  there is some old scar. No cardiac cath needed.  Can follow up in clinic if needed.   Appended Document: Cardiology Nuclear Testing Letter mailed

## 2010-09-26 NOTE — Miscellaneous (Signed)
Summary: lab update  Clinical Lists Changes  Observations: Added new observation of ALBUMIN: 4.9 g/dL (16/05/9603 54:09) Added new observation of PROTEIN, TOT: 7.3 g/dL (81/19/1478 29:56) Added new observation of CALCIUM: 9.2 mg/dL (21/30/8657 84:69) Added new observation of ALK PHOS: 88 units/L (09/08/2009 15:05) Added new observation of BILI TOTAL: 0.6 mg/dL (62/95/2841 32:44) Added new observation of SGPT (ALT): 32 units/L (09/08/2009 15:05) Added new observation of SGOT (AST): 22 units/L (09/08/2009 15:05) Added new observation of CO2 PLSM/SER: 21 meq/L (09/08/2009 15:05) Added new observation of CL SERUM: 103 meq/L (09/08/2009 15:05) Added new observation of K SERUM: 3.9 meq/L (09/08/2009 15:05) Added new observation of NA: 138 meq/L (09/08/2009 15:05) Added new observation of CREATININE: 0.81 mg/dL (08/27/7251 66:44) Added new observation of BUN: 12 mg/dL (03/47/4259 56:38) Added new observation of BG RANDOM: 93 mg/dL (75/64/3329 51:88) Added new observation of TRIGLYCERIDE: 108 mg/dL (41/66/0630 16:01) Added new observation of HDL: 49 mg/dL (09/32/3557 32:20) Added new observation of LDL: 57 mg/dL (25/42/7062 37:62) Added new observation of CHOLESTEROL: 128 mg/dL (83/15/1761 60:73)      -  Date:  09/08/2009    Cholesterol: 128    LDL: 57    HDL: 49    Triglycerides: 710    BG Random: 93    BUN: 12    Creatinine: 0.81    Sodium: 138    Potassium: 3.9    Chloride: 103    CO2 Total: 21    SGOT (AST): 22    SGPT (ALT): 32    T. Bilirubin: 0.6    Alk Phos: 88    Calcium: 9.2    Total Protein: 7.3    Albumin: 4.9

## 2010-09-26 NOTE — Progress Notes (Signed)
Summary: [facility]  Phone Note Refill Request Call back at Home Phone [phone] Message from:  Patient on April 08, 2010 9:38 AM  Refills Requested: Medication #1:  VYTORIN 10-40 MG TABS Take one tablet by mouth dailyat bedtime [facility]  Initial call taken by: Harlon Flor,  April 08, 2010 9:38 AM Caller: Denton Lank    Prescriptions: VYTORIN 10-40 MG TABS (EZETIMIBE-SIMVASTATIN) Take one tablet by mouth dailyat bedtime  #30 x 6   Entered by:   Bishop Dublin, CMA   Authorized by:   Dossie Arbour MD   Signed by:   Bishop Dublin, CMA on 04/08/2010   Method used:   Electronically to        [facility] [phone][facility] (retail)       [address]       [place], [place]  [address]       Ph: [phone] or [phone]       Fax: [phone]   RxID:   [id number]

## 2010-09-26 NOTE — Assessment & Plan Note (Signed)
Summary: [name]   Visit Type:  Follow-up Primary [name]:  Pat Kocher, MD  CC:  Has shortness of breath.  Denies chest pain.[name]  History of Present Illness: 55 year old gentleman with a history of coronary artery disease, bypass surgery, hyperlipidemia and hypertension, smoking history for 23 years who stopped in 2003,  who presents for routine follow up.  previously, [name] had short episodes of chest discomfort ascribed as a tingling pain sometimes with flushing and shortness of breath. These lasted several minutes. These have been chronic. Recently he has been feeling well and does not report having significant symptoms. He has been active with no shortness of breath with exertion. He would like a refill of his medications  Cardiac Cath Procedure date:  07/01/2007  1. Significant three-vessel CAD.   2. Patent LIMA to LAD with no severe significant disease in the LAD       beyond IMA insertion.   3. Patent vein graft to the diagonal with no disease beyond the       diagonal graft insertion.   4. Patent vein graft to the obtuse marginal, with no disease beyond       the graft insertion.   5. Patent vein graft to the right coronary artery with mild 50%       percent narrowing of the right coronary artery beyond the graft       insertion.   6. Mildly depressed left ventricular systolic function.  Wall motion       abnormalities noted above.   EKG shows normal sinus rhythm with rate 69 beats per minute, unable to exclude an old anteroseptal infarct  Preventive Screening-Counseling & Management  Alcohol-Tobacco     Smoking Status: never  Caffeine-Diet-Exercise     Does Patient Exercise: yes      Drug Use:  no.    Current Medications (verified): 1)  Toprol Xl 25 Mg Xr24h-Tab (Metoprolol Succinate) .... Take 1/2  Tab By Mouth Daily 2)  Aspirin 81 Mg Tbec (Aspirin) .... Take One Tablet By Mouth Daily 3)  Crestor 20 Mg Tabs (Rosuvastatin Calcium) .... One Tablet At  Bedtime 4)  Nitrostat 0.4 Mg Subl (Nitroglycerin) .[name].. 1 Tablet Under Tongue At Onset of Chest Pain; You May Repeat Every 5 Minutes For Up To 3 Doses. 5)  Viagra 100 Mg Tabs (Sildenafil Citrate) .... Take 1/2 By Mouth As Needed 6)  Amlodipine Besylate 10 Mg Tabs (Amlodipine Besylate) .... Take One Tablet By Mouth Daily 7)  Losartan Potassium 100 Mg Tabs (Losartan Potassium) .... Take 1 Tablet By Mouth Once A Day 8)  Effient 10 Mg Tabs (Prasugrel Hcl) .... Take One Tablet By Mouth Daily  Allergies (verified): 1)  ! Penicillin 2)  ! Heparin  Past History:  Family History: Last updated: Aug 04, 2010 Father: Deceased; MI age 81 Mother: Deceased; MI age 56  Social History: Last updated: August 04, 2010 Full Time Married  Tobacco Use - No.  Alcohol Use - yes-rare Regular Exercise - yes- works in yard Drug Use - no  Risk Factors: Exercise: yes (08/04/10)  Risk Factors: Smoking Status: never (August 04, 2010)  Past Medical History: CAD chronic fatique Hyperlipidemia Hypertension  Past Surgical History: CABG neck surgery x 2 hernia repair appendectomy  Family History: Father: Deceased; MI age 78 Mother: Deceased; MI age 26  Social History: Full Time Married  Tobacco Use - No.  Alcohol Use - yes-rare Regular Exercise - yes- works in yard Drug Use - no Smoking Status:  never Does Patient Exercise:  yes Drug Use:  no  Review of Systems  The patient denies fever, weight loss, weight gain, vision loss, decreased hearing, hoarseness, chest pain, syncope, dyspnea on exertion, peripheral edema, prolonged cough, abdominal pain, incontinence, muscle weakness, depression, and enlarged lymph nodes.    Vital Signs:  Patient profile:   55 year old male Height:      66 inches Weight:      205 pounds BMI:     33.21 Pulse rate:   72 / minute BP sitting:   138 / 84  (left arm) Cuff size:   regular  Vitals Entered By: Bishop Dublin, CMA (July 26, 2010 10:06 AM)  Physical  Exam  General:  Well developed, well nourished, in no acute distress. Head:  normocephalic and atraumatic Neck:  Neck supple, no JVD. No masses, thyromegaly or abnormal cervical nodes. Lungs:  Clear bilaterally to auscultation and percussion. Heart:  Non-displaced PMI, chest non-tender; regular rate and rhythm, S1, S2 without murmurs, rubs or gallops. Carotid upstroke normal, no bruit.  Pedals normal pulses. No edema, no varicosities. Abdomen:  Bowel sounds positive; abdomen soft and non-tender without masses Msk:  Back normal, normal gait. Muscle strength and tone normal. Pulses:  pulses normal in all 4 extremities Extremities:  No clubbing or cyanosis. Neurologic:  Alert and oriented x 3. Skin:  Intact without lesions or rashes. Psych:  Normal affect.   Impression & Recommendations:  Problem # 1:  CAD, ARTERY BYPASS GRAFT (ICD-414.04) no symptoms of chest pain or shortness of breath concerning for angina. No further testing at this time.  His updated medication list for this problem includes:    Toprol Xl 25 Mg Xr24h-tab (Metoprolol succinate) .[name]... Take 1/2  tab by mouth daily    Aspirin 81 Mg Tbec (Aspirin) .[name]... Take one tablet by mouth daily    Nitrostat 0.4 Mg Subl (Nitroglycerin) .[name]... 1 tablet under tongue at onset of chest pain; you may repeat every 5 minutes for up to 3 doses.    Amlodipine Besylate 10 Mg Tabs (Amlodipine besylate) .[name]... Take 0.5 tab by mouth daily    Effient 10 Mg Tabs (Prasugrel hcl) .[name]... Take one tablet by mouth daily  Problem # 2:  HYPERLIPIDEMIA-MIXED (ICD-272.4) He is interested in possibly changing to Lipitor given the cost of Crestor. We will change him to Lipitor 40 mg daily.  His updated medication list for this problem includes:    Lipitor 40 Mg Tabs (Atorvastatin calcium) .[name]... Take one tablet by mouth daily.  Problem # 3:  HYPERTENSION, BENIGN (ICD-401.1) Blood pressure was recently well controlled on his current medication regimen. No  changes at this time His updated medication list for this problem includes:    Toprol Xl 25 Mg Xr24h-tab (Metoprolol succinate) .[name]... Take 1/2  tab by mouth daily    Aspirin 81 Mg Tbec (Aspirin) .[name]... Take one tablet by mouth daily    Amlodipine Besylate 10 Mg Tabs (Amlodipine besylate) .[name]... Take 0.5 tab by mouth daily    Losartan Potassium-hctz 100-25 Mg Tabs (Losartan potassium-hctz) .[name]... Take one tab by mouth once daily  Patient Instructions: 1)  Your physician recommends that you schedule a follow-up appointment in: 1year 2)  Your physician recommends that you return for a FASTING lipid profile: January 2012 (Lipid/liver) 3)  Your physician has recommended you make the following change in your medication: Stop Crestor. Start Lipitor 40mg  by mouth once daily. Decrease Amlodipine to 5mg  once daily. Stop Losartan. Start Losartan/HCTZ 100/25mg  once daily. Prescriptions:  NITROSTAT 0.4 MG SUBL (NITROGLYCERIN) 1 tablet under tongue at onset of chest pain; you may repeat every 5 minutes for up to 3 doses.  #25 x 3   Entered by:   Cloyde Reams RN   Authorized by:   Dossie Arbour MD   Signed by:   Cloyde Reams RN on 07/26/2010   Method used:   Print then Give to Patient   RxID:   [id number] LIPITOR 40 MG TABS (ATORVASTATIN CALCIUM) Take one tablet by mouth daily.  #30 x 6   Entered by:   Cloyde Reams RN   Authorized by:   Dossie Arbour MD   Signed by:   Cloyde Reams RN on 07/26/2010   Method used:   Print then Give to Patient   RxID:   [id number] VIAGRA 100 MG TABS (SILDENAFIL CITRATE) Take 1/2 by mouth as needed  #7 x 5   Entered by:   Cloyde Reams RN   Authorized by:   Dossie Arbour MD   Signed by:   Cloyde Reams RN on 07/26/2010   Method used:   Print then Give to Patient   RxID:   [id number] WNUUVOZD POTASSIUM-HCTZ 100-25 MG TABS (LOSARTAN POTASSIUM-HCTZ) Take one tab by mouth once daily  #30 x 6   Entered by:   Cloyde Reams RN   Authorized by:   Dossie Arbour MD    Signed by:   Cloyde Reams RN on 07/26/2010   Method used:   Print then Give to Patient   RxID:   [id number]

## 2010-09-26 NOTE — Letter (Signed)
Summary: Medical Record Release  Medical Record Release   Imported By: Harlon Flor 11/16/2009 07:54:52  _____________________________________________________________________  External Attachment:    Type:   Image     Comment:   External Document

## 2010-10-24 ENCOUNTER — Ambulatory Visit: Payer: Self-pay | Admitting: Internal Medicine

## 2011-01-07 NOTE — Op Note (Signed)
[name]                 ACCOUNT NO.:  [id number]   MEDICAL RECORD NO.:  [id number]          PATIENT TYPE:  OIB   LOCATION:  3599                         FACILITY:  [facility]   PHYSICIAN:  [name], M.D.DATE OF BIRTH:  05-03-1956   DATE OF PROCEDURE:  03/22/2008  DATE OF DISCHARGE:                               OPERATIVE REPORT   PREOPERATIVE DIAGNOSES:  1. C7-T1 cervical disk herniation.  2. Cervical spondylosis.  3. Cervical degenerative disk disease.  4. Cervical radiculopathy.   POSTOPERATIVE DIAGNOSES:  1. C7-T1 cervical disk herniation.  2. Cervical spondylosis.  3. Cervical degenerative disk disease.  4. Cervical radiculopathy.   PROCEDURE:  C7-T1 anterior cervical decompression and arthrodesis with a  Zero-P interbody implant and plate, and screws and Actifuse putty.   SURGEON:  Robert Shorts, MD   ASSISTANTS:  1. Russell L. Webb Silversmith, RN  2. Danae Orleans. Venetia Maxon, MD   ANESTHESIA:  General endotracheal.   INDICATIONS:  The patient is a 55 year old man who presents with a left  C8 cervical radiculopathy.  He had previous C5-C6 and C6-C7 anterior  cervical diskectomy fusion with allograft and tethered cervical plating.  MRI was obtained, revealed a left C7-T1 cervical disk herniation,  superimposed, degenerative disk disease, spondylosis, and decision was  made to proceed with decompression and arthrodesis.   PROCEDURE:  The patient was brought to the operating room and placed  under general endotracheal anesthesia.  The patient was placed in 10  pounds of Holter traction.  The neck was prepped with Betadine soaked  solution and draped in a sterile fashion.  A horizontal incision was  made in the left side of the neck below the level of the previous  incision and just above the left clavicle.  The line of the incision was  infiltrated with local anesthetic with epinephrine and dissection was  carried down through the subcutaneous tissue and platysma.   Bipolar  cautery and electrocautery was used to maintain hemostasis.  Dissection  was carried down through the avascular plane leaving the  sternocleidomastoid, carotid artery, and jugular vein laterally and the  trachea and esophagus medially and the ventral aspect of the vertebral  cord was identified.  We were able to palpate the lower aspect of the  previous plate and we were able to dissect the prevertebral tissues and  expose the annulus of the C7-T1 disk immediately below the previous  plate.  The microscope was draped and brought into the field to provide  additional navigation, illumination, and visualization and the  decompression was performed using microdissection and microsurgical  technique.  The annulus was incised in the disk space and to proceed  with a diskectomy using variety of microcurettes and pituitary rongeurs.  Anterior osteophytic overgrowth was removed using the Kerrison punches  and dissection was carried down posteriorly through the disk space and  eventually, we saw the posterior spurring at posterior aspect of the C7-  C1 disk space and this was carefully removed using the X-Max drill and 2-  mm Kerrison punch with a thin footplate.  The posterior longitudinal  ligament was carefully removed and the decompression was extended  laterally, decompressing the exiting C8 nerve roots bilaterally.  In the  end, all loose disk material and osteophytic overgrowth was removed  decompressing the thecal sac and nerve root bilaterally and then  hemostasis was established with the use of Gelfoam soaked with thrombin.  We measured the height of the intravertebral disk space and selected the  9-mm parallel Zero-P implant.  This was packed with Actifuse putty and  then we carefully positioned the implant in the intravertebral disk  space.  It was mildly countersunk.  We then used an awl to start the  screw holes.  We used 16-mm screws, placing the more lateral screws   superiorly in to C7 and the more medial screws inferiorly at the T1.  Each of the screw holes was started with an awl and the screws placed.  Final tightening was done with a Torque Wrench for the T1 screws, but  with the screwdriver for the C7 screws because we could not position the  Torque Wrench screwdriver into the screw heads because of the clavicle  and final tightening was done with a screwdriver.  An x-ray was taken  and C-arm fluoroscopic images were taken as well which showed good  position in the interbody space of the implant, although the view was  limited due to the shoulders.  The wound was irrigated with Bacitracin  solution.  Hemostasis was established and confirmed, and then we  proceeded with closure.  The platysma was closed with interrupted  inverted 2-0 undyed Vicryl sutures.  The subcutaneous and subcuticular  layer were closed with interrupted inverted 3-0 undyed Vicryl sutures.  The skin was re-approximated with Dermabond.  The procedure was  tolerated well.  The estimated blood loss was 50 mL.  Sponge and needle  count were correct.  Following surgery, the patient was placed in a soft  cervical collar, reversed from the anesthetic, extubated, and  transferred to the recovery room for further care where he was noted to  be moving all 4 extremities with command.      [name], [name]  Electronically Signed     RWN/MEDQ  D:  03/22/2008  T:  03/23/2008  Job:  [id number]

## 2011-01-07 NOTE — Cardiovascular Report (Signed)
NAMEJERMARCUS, [name]                 ACCOUNT NO.:  [id number]   MEDICAL RECORD NO.:  [id number]          PATIENT TYPE:  OIB   LOCATION:  2853                         FACILITY:  [facility]   PHYSICIAN:  [name], MD  DATE OF BIRTH:  [birth year]   DATE OF PROCEDURE:  07/01/2007  DATE OF DISCHARGE:                            CARDIAC CATHETERIZATION   PROCEDURES:  1. Left heart catheterization.  2. Coronary angiography.  3. Left ventriculogram.  4. Saphenous vein graft angiography.  5. Left internal mammary angiography.   COMPLICATIONS:  None.   [name] is a 55 year old male with a history of CAD, status bypass  surgery in June 2003, consisting of LIMA to LAD, vein graft to diagonal,  vein graft to OM, vein graft to RCA.  He did ultimately get a PTCA of  the OM through the vein graft.  He has had several catheterizations at  this time, all revealing the patent grafts.  He recently complained of  increasing chest pain and had abnormal Cardiolite scan, suggesting of  new inferolateral ischemia.  He is now referred for repeat  catheterization to reassess his grafts.   DESCRIPTION OF PROCEDURE:  After giving informed consent, the patient  was brought to the cardiac cath lab and was shaved, prepped in usual  sterile fashion.  ECG monitor was established.  Using the modified  Seldinger technique, a #6-French sheath was introduced in the arterial  sheath right femoral artery.  A 6-French diagnostic catheters was used  to perform diagnostic angiography.   Left main was a medium-size vessel with no significant disease.   The LAD is subtotally occluded proximal.  There is a patent LIMA which  inserts into the midportion of the LAD.  The IMA is a small vessel but  has excellent flow into the LAD.  There does not appear to be any  significant disease beyond the IMA insertion.   There is a patent saphenous vein graft to a diagonal, which appears  widely patent.  This does also retrograde  fill the LAD.   The left circumflex is a small vessel which courses the AV grafts and 2  obtuse marginal branches.  The AV circumflex noted 50% proximal  stenosis.   First OM is a small to medium size vessel which is irregular but has no  high-grade stenosis.   There is a patent saphenous vein graft with insertion to the lower OM.  The graft is widely patent.  There is no significant disease beyond the  graft insertion.   The right coronary artery is totally occluded proximally.  There is a  patent vein graft insertion at the distal portion of the RCA, and then  fills the PDA and posterolateral branch.  The graft is widely patent.  There is mild 50% narrowing beyond the graft insertion.   Left ventriculogram reveals a mildly depressed EF of 45% with inferior  hypokinesis.   HEMODYNAMIC RESULTS:  Systemic arterial pressure 95/53, LV suppression  95/2, LVEDP of 6.   CONCLUSIONS:  1. Significant three-vessel CAD.  2. Patent LIMA to LAD with no  severe significant disease in the LAD      beyond IMA insertion.  3. Patent vein graft to the diagonal with no disease beyond the      diagonal graft insertion.  4. Patent vein graft to the obtuse marginal, with no disease beyond      the graft insertion.   IMPRESSION:  1. Patent vein graft to the right coronary artery with mild 50%      percent narrowing of the right coronary artery beyond the graft      insertion.  2. Mildly depressed left ventricular systolic function.  Wall motion      abnormalities noted above.      [name], MD  Electronically Signed     RHM/MEDQ  D:  07/01/2007  T:  07/01/2007  Job:  [id number]

## 2011-01-10 NOTE — Discharge Summary (Signed)
NAMEAAIDEN, DEPOY                 ACCOUNT NO.:  [id number]   MEDICAL RECORD NO.:  [id number]          PATIENT TYPE:  OBV   LOCATION:  4738                         FACILITY:  [facility]   PHYSICIAN:  Richard A. Alanda Amass, M.D.DATE OF BIRTH:  17-Jul-1956   DATE OF ADMISSION:  10/20/2005  DATE OF DISCHARGE:  10/22/2005                                 DISCHARGE SUMMARY   DISCHARGE DIAGNOSES:  1.  Chest pain, myocardial infarction ruled out.  2.  Labile blood pressure.  3.  Coronary artery bypass grafting in 2003 with percutaneous coronary      intervention of the native right coronary artery after the graft in      2004.  4.  Mild left ventricular dysfunction with ejection fraction of 40%.   HOSPITAL COURSE:  [name] was a 55 year old male well-known to Dr.  Alanda Amass with multiple admissions for chest pain. He was admitted by Dr.  Waldon Reining on October 21, 2005 with chest pain consistent with unstable  angina. The patient was admitted to telemetry. Troponins were negative. He  was initially put on IV nitrates and these were changed to p.o. and he was  ambulated. The question of whether or not he might of had a panic attack or  whether his symptoms may have been secondary to prior Viagra. We felt he  could be discharged October 21, 2005 and he could undergo an outpatient  Cardiolite study.   DISCHARGE MEDICATIONS:  1.  Lotrel 5/10 daily.  2.  Nexium 40 milligrams a day.  3.  Toprol XL 25 milligrams a day.  4.  Aspirin 81 milligrams a day.  5.  Vytorin 10/40 a day.  6.  Viagra as needed.  7.  Imdur 30 milligrams a day, although he should not take Viagra if he is      going to take his Imdur.   He will be set up for an outpatient Cardiolite.   LABS:  EKG shows sinus rhythm, septal Q-waves, inferior Q-waves. Chest x-ray  shows borderline cardiomegaly. White count 10.0, hemoglobin 13.2, hematocrit  38.1, platelets 258,000. INR 1.2. Sodium 139, potassium 3.1, BUN 8,  creatinine 0.7.  LFTs were normal. TSH 2.86. CK-MB and troponins were  negative x2.   DISPOSITION:  The patient was discharged in stable condition and will follow-  up with [name] after his Cardiolite. He did have an abnormal chest x-  ray this admission with a question of a right middle lobe infiltrate versus  atelectasis.      [name], [name]      Richard A. Alanda Amass, [name]  Electronically Signed    LKK/MEDQ  D:  11/11/2005  T:  11/12/2005  Job:  [id number]   cc:   Gerlene Burdock A. Alanda Amass, [name]  Fax: [phone]

## 2011-01-10 NOTE — H&P (Signed)
[name], [name]                 ACCOUNT NO.:  [id number]   MEDICAL RECORD NO.:  [id number]          PATIENT TYPE:  EMS   LOCATION:  MAJO                         FACILITY:  [facility]   PHYSICIAN:  Ulyses Amor, MD DATE OF BIRTH:  02-27-1956   DATE OF ADMISSION:  11/24/2004  DATE OF DISCHARGE:                                HISTORY & PHYSICAL   REASON FOR ADMISSION:  [name] is a 55 year old white man who is  readmitted to the hospital. He was just discharged yesterday after a one-day  hospitalization for chest pain. His complaints now are left neck, left  shoulder, and left arm pain.   HISTORY OF PRESENT ILLNESS:  The patient has a history of coronary artery  disease. He has previously suffered a myocardial infarction. He underwent  percutaneous intervention on his native circumflex in 2003 and then  subsequently coronary artery bypass graft surgery later that year. Other  medical problems include hypertension and dyslipidemia. His ejection  fraction is mildly depressed at 40%.   Since being sent home, the patient has experienced continuous, sharp  discomfort in the left neck, left shoulder, and left arm. It is exacerbated  by movement of his arm and of his neck. There is no associated dyspnea,  diaphoresis, or nausea. It appears not to be related to meals or  respiration. He notes no chest pain, tightness, heaviness, pressure, or  squeezing. The aforementioned discomfort is totally different from his past  cardiac chest pain.   During the hospitalization, which ended yesterday, he underwent CT scanning.  This excluded a pulmonary embolus and an aortic dissection. There was noted  a small area of neural thrombus involving the upper aspects of the  descending thoracic aorta. An acute myocardial infarction was excluded with  negative cardiac enzymes and he was discharged with instructions to schedule  an outpatient Cardiolite stress test.   SOCIAL HISTORY:  The patient  lives with his wife. 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 He no longer smokes. He does not drink.   PAST SURGICAL HISTORY:  1.  Coronary artery bypass graft surgery.  2.  Inguinal herniorrhaphy.   FAMILY HISTORY:  Both his parents died of myocardial infarction.   ALLERGIES:  HEPARIN and PENICILLIN.   MEDICATIONS:  1.  Aspirin 81 mg p.o. q.d.  2.  Zocor 40 mg p.o. q.d.  3.  Nexium 40 mg p.o. q.d.  4.  Toprol XL 12.5 mg p.o. q.d.  5.  Lotrel 5/50 mg p.o. q.d.   REVIEW OF SYMPTOMS:  reveals no new problems related to his head, eyes,  ears, nose, mouth, throat, lungs, gastrointestinal system, genitourinary  system, or extremities. There is no history of neurologic or psychiatric  disorder. There was no history of fever, chills, or weight loss.   PHYSICAL EXAMINATION:  VITAL SIGNS:  Blood pressure 148/90, pulse 69 and  regular, respirations 20, temperature 97.5. Pulse oximeter 99% on room air.  GENERAL:  The patient was a middle-aged white male in no discomfort. He is  alert, oriented, appropriate, and responsive.  HEENT:  Normal.  NECK:  Without thyromegaly or adenopathy. Carotid pulses were palpable  bilaterally and without bruits.  CARDIOVASCULAR:  Normal S1 and S2. There was no S3, S4, murmur, rub, or  click. Cardiac rhythm was regular. No chest wall tenderness was noted.  LUNGS:  Clear.  MUSCULOSKELETAL:  Palpation of the left neck and left shoulder reproduce the  patient's pain exactly.  ABDOMEN:  Soft and nontender. There was no mass, tenderness,  hepatosplenomegaly, bruit, distention, rebound, guarding, or rigidity. Bowel  sounds are normal.  RECTAL:  Not performed as they were not pertinent to the reason for acute  care hospitalization.  GENITALIA:  Not performed as they were not pertinent to the reason for acute  care hospitalization.  EXTREMITIES:  Without edema, deviation, or deformity. Radial and dorsalis  pedis pulses were palpable bilaterally.   NEUROLOGICAL:  Unremarkable.   LABORATORY DATA:  The electrocardiogram revealed sinus bradycardia. There  was evidence of a prior septal myocardial infarction. Tracing was otherwise  unremarkable. The chest radiograph, according to the radiologist,  demonstrated diffuse interstitial coarsening or vascular congestion. There  was no overt edema or focal consolidation.   Initial set of cardiac markers revealed a myoglobin of 28.3, CK of less than  __________, and troponin less than 0.05. Potassium was 3.9, BUN 10,  creatinine 0.7. The remaining studies were pending at the time of this  dictation.   IMPRESSION:  1.  Left neck, left shoulder, and left arm pain:  No chest pain. Suspect      musculoskeletal or neurologic etiology. Doubt cardiac etiology.  2.  Coronary artery disease:  Mild left ventricular dysfunction. Ejection      fraction 40%. Status post myocardial infarction. Status post circumflex      percutaneous coronary intervention. Status post coronary artery bypass      surgery.  3.  Small mural thrombus descending thoracic aorta.  4.  Hypertension.  5.  Dyslipidemia.   PLAN:  1.  Telemetry.  2.  Serial cardiac enzymes.  3.  Aspirin.  4.  No heparin (due to allergy).  5.  Analgesia.  6.  Consider neurology consult.  7.  Consider Coumadin for mural thrombus.  8.  Further measures per Dr. [name].      MSC/MEDQ  D:  11/24/2004  T:  11/25/2004  Job:  [id number]   cc:   [name], MD  [address], [address]  [place]  [place] [address]  Fax: [phone]

## 2011-01-10 NOTE — Consult Note (Signed)
**Note Robert via Obfuscation** Fry, [name]                 ACCOUNT NO.:  [id number]   MEDICAL RECORD NO.:  [id number]          PATIENT TYPE:  INP   LOCATION:  5524                         FACILITY:  [facility]   PHYSICIAN:  Genene Churn. Love, [name]    DATE OF BIRTH:  1955/09/27   DATE OF CONSULTATION:  DATE OF DISCHARGE:                                   CONSULTATION   DATE OF CONSULTATION:  November 25, 2004.   REASON FOR ADMISSION:  This 55 year old, right-handed, white married male  has a known history of hypertension and known cardiac disease, who was  admitted for neck, shoulder, and arm pain.   HISTORY OF PRESENT ILLNESS:  [name] has a past history of hypertension  for approximately 20 years.  He has also had coronary artery disease with  two Mis, one in 2001 and one in 2003, the latter for which he was treated  with four-vessel coronary artery bypass surgery, LIMA to LAD, saphenous vein  graft to diagonal, saphenous vein graft to obtuse marginal, saphenous vein  graft to RCA.  He has also been documented to have a small mural thrombus in  the descending aorta and hyperlipidemia.  He noted the onset of soreness in  his left neck, shoulder, upper arm without trauma, November 21, 2004, followed  by stabbing pain occurring in the left shoulder with pain going into the  left upper arm above the elbow with vague, diffuse numbness in the left arm.  This was aggravated by movement, coughing, and sneezing.  He was admitted on  November 22, 2004, and discharged November 23, 2004, after MI was ruled out, and he  was readmitted on November 24, 2004, because of increasing pain with movement,  cough, and sneeze.   PAST MEDICAL HISTORY:  As noted above:  1.  History of a left lumbar radiculopathy.  2.  Documented coronary artery disease.  3.  Hypertension  4.  Hyperlipidemia.   MEDICATIONS AT HOME:  1.  Toprol-XL 12.5 mg daily.  2.  Zocor 40 mg daily.  3.  Lotrel 1 mg daily.  4.  Aspirin one daily.  5.  Nitroglycerin p.r.n.  6.   Nexium p.r.n.  7.  Viagra p.r.n.   PHYSICAL EXAMINATION:  GENERAL:  A well-developed white male in pain.  VITAL SIGNS:  Sitting blood pressure in right and left arm 140/80.  Heart  rate was 84.  NECK:  There were no bruits.  The pain was increased with extension of his  head and with turning his head to the left and decreased by turning his head  to the right.  MENTAL STATUS:  He is alert and oriented x3, follows one, two, and three-  step commands.  CRANIAL NERVE EXAMINATION:  Visual fields full, disks flat, extraocular  movements full, corneal's present, no seventh nerve palsy, hearing intact,  air conduction greater than bone conduction, tongue midline, uvula midline,  gag is present, sternocleidomastoid and trapezius testing normal.  MOTOR EXAMINATION:  Bilateral 5/5 strength proximally and distally in the  upper and lower extremities.  No evidence of drift.  COORDINATION TESTING:  Within normal limits.  He had altered sensation to  pinprick throughout the left arm.  At one time, it was consistent; later in  the examination it was normal.  REFLEXES:  Deep tendon reflexes were 2+, and plantar responses were  downgoing.  GAIT:  Gait was done and unremarkable.   IMPRESSION:  1.  Suspect left C5 or C6 radiculopathy, code 353.2.  2.  History of lower back pain, code 724.4.  3.  Coronary artery disease, code 429.2.  4.  Hypertension, code 706.2.  5.  Hyperlipidemia, code 272.4.   PLAN:  Plans at this time are to review his cervical spine x-rays, consider  an MRI study of the cervical spine, and steroid therapy.      JML/MEDQ  D:  11/25/2004  T:  11/25/2004  Job:  [id number]   cc:   [name], MD  [address], [address]  [place]  [place] [address]  Fax: [phone]

## 2011-01-10 NOTE — Op Note (Signed)
NAMEGERMAIN, [name]                 ACCOUNT NO.:  [id number]   MEDICAL RECORD NO.:  [id number]          PATIENT TYPE:  INP   LOCATION:  3041                         FACILITY:  [facility]   PHYSICIAN:  Hewitt Shorts, M.D.DATE OF BIRTH:  [birth year]   DATE OF PROCEDURE:  12/23/2004  DATE OF DISCHARGE:                                 OPERATIVE REPORT   PREOPERATIVE DIAGNOSIS:  C5-6 and C6-7 cervical disk herniation with  spondylosis, degenerative joint disease and radiculopathy.   POSTOPERATIVE DIAGNOSIS:  C5-6 and C6-7 cervical disk herniation with  spondylosis, degenerative joint disease and radiculopathy.   PROCEDURE:  C5-6 and C6-7 anterior cervical diskectomy, arthrodesis, with  VG2 allograft and Tether cervical plating.   SURGEON:  Hewitt Shorts, [name]   ASSISTANT:  Hilda Lias, [name]   ANESTHESIA:  General endotracheal.   INDICATIONS:  The patient is a 55 year old man who presented with a left  cervical radiculopathy.  He was found on MRI scan, myelogram and post-  myelogram CT scan to have advanced spondylosis and degenerative joint  disease at the C5-6 and C6-7 level.  There was shallow disk herniation  within the left C6-7 neural foramen, which was felt to be the cause of his  acute radiculopathy, and a decision was made to proceed with two-level  anterior cervical diskectomy and arthrodesis.   PROCEDURE:  The patient was brought to the operating room and placed under  general endotracheal anesthesia.  The patient was placed in 10 pounds of  Holter traction.  The neck was prepped with Betadine soap and solution and  draped in a sterile fashion.  A horizontal incision was made in the left  side of the neck.  The line of the incision was infiltrated with local  anesthetic with epinephrine.  Dissection was carried down through the  subcutaneous tissue and platysma.  Bipolar cautery was used to maintain  hemostasis.  Dissection was then carried out through an avascular  plane  leaving the sternocleidomastoid, carotid artery and jugular vein laterally  and the trachea and esophagus medially.  The ventral aspect of the vertebral  column was identified and a localizing x-ray was taken, and the C5-6 and C6-  7 intervertebral disk spaces were identified.  There were large anterior  osteophytes at each level and these were carefully removed, and then we  entered into the disk space and proceeded with a thorough diskectomy using a  variety of microcurettes and pituitary rongeurs.  The microscope was draped  and brought into the field to provide additional magnification, illumination  and visualization, and the remainder of the decompression was performed  using microdissection and microsurgical technique.  There was significant  spondylitic overgrowth posteriorly at each level that was carefully removed  using an X-Max drill along with a 2 mm Kerrison punch and a thin foot plate.  The posterior longitudinal ligament was thickened and partially calcified at  each level, and this was likewise carefully removed, decompressing the  spinal canal and thecal sac.  We then carefully examined each of the neural  foramina but in particularly the left  C6-7 neural foramen.  In each case,  spondylitic overgrowth was carefully removed and at the left C6-7 neural  foramen, we did find several small fragments of soft disk herniation.  In  the end, good decompression of the thecal sac and spinal canal as well as at  the nerve root, foramen and nerve roots at each level bilaterally was  achieved.  Once the decompression was completed, hemostasis was established  with the use of Gelfoam soaked in thrombin.  We then measured the height of  the intervertebral disk spaces at each level and selected two 6 mm VG2  allografts.  They were positioned in the intervertebral disk space and  counter sunk.  We then selected a 32 mm Tether cervical plate.  It was  positioned over the fusion  construct and secured to C5 and C7 with a pair of  4 x 14 mm screws and at C6 with a single 4 x 14 mm screw.  Each screw hole  was drilled and tapped and the screws placed in an alternating fashion.  Once all five screws were in place, final tightening was done.  We then  irrigated the wound with bacitracin solution and it was checked for  hemostasis, which was established and confirmed.  An x-ray was taken, which  showed the upper portion of the construct to be in good position.  We could  not see the lower portion due to the patient's shoulders.  However, under  direct vision all the construct appeared good.  The wound was checked for  hemostasis once again, and then we proceeded with closure.  The platysma was  closed with interrupted, inverted 2-0 undyed Vicryl sutures, and the  subcutaneous and subcuticular layer were closed with interrupted, inverted 3-  0 undyed Vicryl sutures, and the skin edges were reapproximated with  Dermabond.  The patient tolerated the procedure well.  The estimated blood  loss was 50 mL.  Sponge and needle count were correct.  Following surgery  the patient was placed in a soft cervical collar, reversed from the  anesthetic, extubated and transferred to the recovery room for further care,  where he was noted to be moving all four extremities to command.      RWN/MEDQ  D:  12/23/2004  T:  12/24/2004  Job:  [id number]

## 2011-01-10 NOTE — Cardiovascular Report (Signed)
NAMERAMESES, OU                           ACCOUNT NO.:  [id number]   MEDICAL RECORD NO.:  [id number]                   PATIENT TYPE:  INP   LOCATION:  2023                                 FACILITY:  [facility]   PHYSICIAN:  Cristy Hilts. Jacinto Halim, [name]                  DATE OF BIRTH:  April 12, 1956   DATE OF PROCEDURE:  06/13/2002  DATE OF DISCHARGE:                              CARDIAC CATHETERIZATION   PROCEDURES PERFORMED:  1. Percutaneous transluminal coronary angioplasty and stenting of the     circumflex coronary artery through the saphenous vein graft.  2. Adjuvant Angiomax utilization.  3. Intracoronary nitroglycerin administration.   INDICATION:  The patient is a 55 year old gentleman with a history of  coronary artery disease, status post coronary artery bypass grafting, who  was admitted to the hospital with chest pressure, history of unstable  angina.  He underwent diagnostic cardiac catheterization on June 10, 2002, which revealed a new SVG to circumflex coronary artery stenosis just  after the insertion site.  Given this, he was brought to the cardiac  catheterization laboratory for elective intervention of the native  circumflex coronary artery.   HEMODYNAMIC DATA:  The opening aortic pressure were 118/79 with a mean of 96  mmHg.  The closing pressures were the same.  The patient tolerated the  procedure well.   ANGIOGRAPHIC DATA:  Successful PTCA of the native circumflex coronary artery  at the SVG insertion site with reduction of stenosis from 99% to 0% with  TIMI-3 to TIMI-3 flow maintained.  Stent like results were obtained.  This  was done by a 2.5 x 10 mm CrossSail balloon.  There was no evidence of  dissection, no evidence of thrombosis at the end of the procedure.   RECOMMENDATIONS:  Continued medical therapy is advised. As the patient has  significant history of coronary vasospasm, we will try to initiate therapy  with Coreg.   TECHNIQUE OF PROCEDURE:  Under  the usual sterile precautions, using 7 French  right femoral artery access, and 7 [place] Hockey-stick with side-hole guide  was advanced into the ascending aorta over a 0.035 inch J wire.  The SVG  graft to circumflex coronary artery was selectively cannulated.  Then a 180  cm x 0.014 inch ATW guide wire was advanced into the SVG and into the native  circumflex coronary artery and the tip was manipulated to the engage the  proximal native circumflex coronary artery to protect the side branch.  Then  a 180 cm 0.014 inch BMW guide wire was advanced into the native circumflex  coronary artery through the same SVG and the tip of the wire was carefully  positioned in the distal secondary branch of the obtuse marginal branch of  the circumflex coronary artery.  Then a 2.5 x 10 mm CrossSail balloon was  advanced over the right BMW guide wire and after  confirming the placement,  balloon angioplasty was performed at initially at 6, then 8 and then 10  atmospheres of pressure for 90 seconds.  The balloon was deflated, pulled  back in the guiding catheter.  Intracoronary nitroglycerin was administered  and angiography was performed. Then a second balloon inflation was performed  at 10 atmospheres of pressure for 60 seconds and the balloon deflated,  pulled back to the guiding catheter and angiography was performed after  giving Integrilin and nitroglycerin administration.  Stent-like results were  noted.  After confirming  the successful of the guide wire and the balloon was pulled out an  angiography was performed. Then the guide was disengaged from the SVG graft  and pulled out of the body in a usual fashion. The arterial access sheath  was sutured in place and patient was transferred to recovery in stable  condition.                                                 Cristy Hilts. Jacinto Halim, [name]    Pilar Plate  D:  06/13/2002  T:  06/13/2002  Job:  [id number]

## 2011-01-10 NOTE — Discharge Summary (Signed)
[name], Robert                 ACCOUNT NO.:  [id number]   MEDICAL RECORD NO.:  [id number]          PATIENT TYPE:  INP   LOCATION:  2905                         FACILITY:  [facility]   PHYSICIAN:  [name], MD  DATE OF BIRTH:  [birth year]   DATE OF ADMISSION:  11/22/2004  DATE OF DISCHARGE:  11/23/2004                                 DISCHARGE SUMMARY   DISCHARGE DIAGNOSES:  1.  Unstable angina, ruled for myocardial infarction by negative serial      cardiac enzymes and absent echocardiographic changes.  2.  Known coronary artery disease status post coronary artery bypass graft      in 2003 with prior percutaneous transluminal coronary angioplasty to the      native circumflex in 2003.  3.  Hypertension, controlled.  4.  Hyperlipidemia.  5.  History of tobacco use.  6.  Left ventricular dysfunction with ejection fraction 40%.   HPI/HOSPITAL COURSE:  This is a 55 year old Caucasian gentleman, a patient  of [name], who presented to the emergency room with complaints of chest  pain, onset afternoon of November 22, 2004, at work.  He perceived this as  discomfort in the chest and then radiated to the left shoulder.  The day  before the patient also had some discomfort in the left shoulder, took  Tylenol without significant relief.  On day of his presentation patient also  had some mild shortness of breath and was nauseated and pain increased in  intensity to 0.1 and became severe with radiation to both shoulders and both  arms.  The patient took nitroglycerin with partial relief.  At the time of  examination in the emergency room, he did not have chest pain but still was  with bilateral arm and shoulder pain.   The patient was assessed by [name] and admitted to the telemetry on  rule out MI protocol.   HOSPITAL PROCEDURES:  He underwent CT of the chest to rule out pulmonary  embolism and aortic dissection, both of the results were negative.  His  cardiac enzymes were  negative x3.   EKG showed sinus rhythm, no acute ST wave changes.   CBC showed a hemoglobin of 13.5, hematocrit 42, white blood cell count 11.6,  platelets 286.  BMP showed sodium 138, potassium 4.3, chloride 107, CO2 24,  BUN 17, creatinine 0.8, glucose 101.  TSH 1.136.   On the morning of discharge, the patient was assessed by [name], found  to be stable from a cardiac standpoint for discharge home.   DISCHARGE MEDICATIONS:  1.  Aspirin 81 mg daily.  2.  Zocor 40 mg daily.  3.  Nexium 40 mg daily.  4.  Toprol XL 12.5 mg daily.  5.  Lotrel 5/10 mg daily.  6.  Nitroglycerin 0.4 mg p.r.n.   DISCHARGE ACTIVITIES:  As tolerated without exertion.   DISCHARGE DIET:  Low-fat, low-cholesterol diet.   FOLLOW UP:  Office will contact patient to schedule an appointment with Dr.  Jenne Campus and prior to that he will be scheduled for outpatient Cardiolite  stress  test.      MK/MEDQ  D:  11/23/2004  T:  11/24/2004  Job:  [id number]   cc:   [name], MD  [address]  [place]  [place] [address]  Fax: [phone]

## 2011-01-10 NOTE — H&P (Signed)
[place]. [facility]  Patient:    [name]                        MRN: [id number] Adm. Date:  [id number] Attending:  [place], Peter Manning Dictator:   Jennet Maduro. Earl Gala, R.N., A.N.P. CC:         Peter M. [place]il[place], [name]  Kizzie Furnish, [name], [place], [place]   History and Physical  CHIEF COMPLAINT:  Chest pain.  HISTORY OF PRESENT ILLNESS:  [name] is a 55 year old white male, who presents to the emergency department today per EMS after having a significant episode of chest pain.  He awoke at approximately 5 a.m. this morning in order to get up to go to the bathroom, when he had the onset of substernal chest discomfort it was nonradiating but was associated with significant diaphoresis, questionable shortness of breath.  His wife who is with him, reports, however, that the most severe episode occurred at approximately 7-7:30 a.m. while she was preparing breakfast.  This episode was also associated with diaphoresis and clamminess.  He may have been somewhat pale, it was associated with nausea but there was no vomiting.  It should be noted that he had six teeth extracted yesterday in the dental office and was given a significant amount of pain medicines "in order to knock him out."  He was sent home on Endodan and his last dose was at 5 p.m. yesterday.  He subsequently had EMS called then he was transported to [facility].  In route he received nitroglycerin x3 with considerable relief of discomfort, as well as aspirin here in the emergency room with IV nitroglycerin and heparin infusion, he reports to be pain-free.  PAST MEDICAL HISTORY: 1. Previous episode of chest pain which necessitated stress testing    reportedly unremarkable, per the patient approximately one year ago by    Dr. Peter [place]aziland. 2. Hypertension for the past 12-13 years. 3. Seasonal allergies. 4. Recent extraction of teeth on June 12, 2000.  ALLERGIES:  PENICILLIN.  CURRENT  MEDICINES: 1. Atenolol 50 mg q.d. 2. Triamterene/HCTZ 75-50 mg q.d. 3. Claritin p.r.n. 4. Endodan 4.88/325 mg p.r.n. pain.  FAMILY HISTORY:  Father died at 11 with heart attack.  Mother died at 68 with heart disease as well as cancer.  There is a history of coronary disease among his siblings.  SOCIAL HISTORY:  He is married he has one son.  A smoker of two packs per day for the past 30 years.  He drinks approximately two beers per day.  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  REVIEW OF SYSTEMS:  Basically as noted above.  He states his last cholesterol panel was approximately one year ago, those results are unknown.  The episodes of chest pain that he has had today are similar to the chest pain that necessitated stress testing one year ago.  He has continued since his stress test to have recurrent episodes which he felt was indigestion.   He had no abdominal pain, no constipation, no diarrhea and no edema.  PHYSICAL EXAMINATION:  GENERAL:  He is pain-free at the present time.  He is in no acute distress.  VITAL SIGNS:  Blood pressure 117/65, heart rate 70, respirations 20.  SKIN:  Warm and dry, color is unremarkable.  HEENT:  Oral mucosa is stable.  CHEST:  Shows coarse breath sounds.  There is no chest wall tenderness.  CARDIAC:  A regular rhythm.  ABDOMEN:  Soft positive bowel sounds, nontender.  EXTREMITIES:  Without edema.  NEUROLOGIC:  No gross focal deficit.  LABORATORY DATA:  Hematocrit 38, white count 12.  Chemistries reveal sodium 144, potassium 3.9, chloride 106, CO2 unavailable, BUN 10, creatinine 1.1, glucose 92.  First CK total 251, MB pending, troponin pending.  RADIOLOGIC DATA:  EKG there are no acute changes showing normal sinus rhythm.  OVERALL IMPRESSION: Episodes of chest pain in the setting of multiple cardiac risk factors (gender, tobacco use, positive family history and hypertension).  PLAN:  He will be admitted will  await cardiac enzymes.  Will continue IV nitroglycerin and heparin for now.  More than likely will need to refer also cardiac catheterization. DD:  06/13/00 TD:  06/13/00 Job: [id number]

## 2011-01-10 NOTE — Discharge Summary (Signed)
[name], [name]                           ACCOUNT NO.:  [id number]   MEDICAL RECORD NO.:  [id number]                   PATIENT TYPE:  INP   LOCATION:  2040                                 FACILITY:  [facility]   PHYSICIAN:  [name], [name]             DATE OF BIRTH:  20-May-1956   DATE OF ADMISSION:  07/06/2003  DATE OF DISCHARGE:  07/08/2003                                 DISCHARGE SUMMARY   ADMISSION DIAGNOSES:  1. Chest pain (atypical).  2. History of coronary artery disease.     a. History of coronary artery bypass graft 2003.     b. Status post repeat catheterization April 2004, with patent grafts,        ejection fraction 40%.  3. Hypertension.  4. Hyperlipidemia.   DISCHARGE DIAGNOSES:  1. Chest pain (atypical), status post negative cardiac enzymes, no EKG     changes.  Continue medical therapy.  Plan for discharge home on     medication.  2. History of coronary artery disease.     a. History of coronary artery bypass graft 2003.     b. Status post repeat catheterization April 2004, with patent grafts,        ejection fraction 40%.  3. Hypertension.  4. Hyperlipidemia.   HISTORY OF PRESENT ILLNESS:  [name] is a 55 year old white married male  with a history of CAD status post CABG in January 2003, by Dr. Tyrone Sage.  He  had postoperative complications of arrest and ARDS.   He has recovered since that time.  He did have a __________ Cardiolite in  October 2003, and underwent followup cardiac catheterization.  At that time,  he did have a significant stenosis in the obtuse marginal beyond the vein  graft.  He underwent angioplasty to the site.  He underwent a repeat  catheterization again in April 2004, after presenting with complaints of  increased chest pain radiating to the left arm.  At that time, a cardiac  catheterization revealed patent graft, patent angioplasty site at the obtuse  marginal and no obstructive CAD.  Plan for medical therapy.   Now,  on the day of this admission, November 11, he stated he had been in his  usual state of health until approximately 10:45, at which time he developed  severe midsternal chest pain, sharp and stabbing pain that felt like a  knife.  He took two sublingual nitroglycerin, which were new, but he gained  no relief and came to the emergency room.  He did notice increased pain with  deep breathing.   At the hospital, his initial blood pressure was 179/103, but with a needle  stick, his blood pressure dropped to at least the 90s and his heart rate  dropped to the 30s and 40s.  That was prior to him receiving any medications  in the ER.  Subsequently, IV fluids were given.  He  was placed in  Trendelenburg.  Blood pressure recovered significantly.  The pain was rated  a 5 over 10 and was stated to be in the midsternal region.  He was started  on IV nitroglycerin and IV morphine.  It was also noted that he did also  have a history of coronary spasm.   PHYSICAL EXAMINATION:  VITAL SIGNS:  Initial blood pressure of 179/103 came  down to 90/60, pulse was 111, oxygen saturation 98%.  No significant new  abnormalities on physical examination.    No significant abnormalities on laboratories.  His initial enzymes were  negative.  EKG showed sinus rhythm, no acute change, although EKG did have  some bradycardia with heart rate 47, but no ST-T change.  At this point, he  was planned for admission.  He was being evaluated by Dr. Kem Boroughs.  He  was planned for admission.  We started him on IV nitroglycerin.  It was  reviewed that he did have an elevated D-dimer in the emergency room, so we  planned to follow up CT scan to rule out pulmonary embolism and aortic  dissection.  As well, we concerned that his platelet count was low at  62,000.  We planned to monitor this carefully.  We checked serial enzymes to  rule out MI.  We would not plan a repeat catheterization unless he were to  have enzyme elevation  of EKG changes.   HOSPITAL COURSE:  On July 07, 2003, [name] was still complaining of  left anterior chest wall pain that was worse with deep breathing and  movement.  It was clearly different from his previous anginal pain.  At this  point, his enzymes were all negative.  CT was negative.  He was seen by Dr.  Jenne Campus.  He planned to stop Refludan and IV nitroglycerin and would resume  Imdur.  He would have him ambulate today, transfer to telemetry floor.  It  was felt that if he was stable in the morning, would consider discharge  home.   On July 08, 2003, [name] was doing well with no further chest pain,  no shortness of breath, no dizziness.  He feels like he is ready to go home.  He is afebrile at 98.0, pulse 55, blood pressure 92/50.  Oxygen saturation  92% on room air.  He has maintained sinus rhythm, sinus bradycardia, with no  significant arrhythmia.  Lungs are clear.  Heart is in regular rhythm.  He  has no edema.   At this point, he is seen and evaluated by Kem Boroughs who deems him  stable for discharge home.   HOSPITAL CONSULTS:  None.   HOSPITAL PROCEDURES:  None.   LABORATORY DATA:  Cardiac markers in the ER showed myoglobin 35.7, CK-MB  less than 1.0, troponin less than 0.05.  Liver function tests are completely  normal.  On admission, sodium 140, potassium 4.1, chloride 108, CO2 24,  glucose 105, BUN 8, creatinine 0.7, liver function tests normal.  On  admission, pro time 12.6, INR 0.9, PTT 22.  Admission white count 9.7,  hemoglobin 14.7, hematocrit 42.6, platelets 62,000.  I do not know if that  was initial laboratory area because on his followup platelet count on  July 07, 2003, the platelet count is 227,000.  That is quite a  discrepancy and we only have those two values here.  Magnesium level 2.1.  BNP of less than 30.0.  Enzymes were negative with CK-MB less than  1.0, troponin less than 0.05, myoglobin 35.7.   Radiology:  A CT of the  abdomen and chest, on July 06, 2003, showed mild  bibasilar atelectasis, otherwise unremarkable chest CT, no evidence of acute  pulmonary embolism or aortic dissection.  No evidence of abdominal aortic  aneurysm or dissection, 2-cm benign hemangioma in the left hepatic lobe.   EKG #1 shows normal sinus rhythm, 95 beats per minute, no ST-T change, poor  anterior R progression.  Followup EKG #2, shows sinus bradycardia at 47  beats per minute, again, poor anterior R-wave progression, but nonspecific  ST-T change.   DISCHARGE MEDICATIONS:  1. Imdur 30 mg once a day.  2. Norvasc 5 mg once a day.  3. Altace 2.5 mg once a day.  4. Aspirin 81 mg once a day.  5. Zocor 40 mg each night.  6. Toprol XL 25 mg 1/2 tablet each day.  7. Nexium 40 mg once a day.  8. Nitroglycerin as directed.   DIET:  Low cholesterol diet.   DISCHARGE INSTRUCTIONS:  On Monday, call [phone] to make an appointment to  see [name] in two weeks.      Mary B. Easley, [name]-C.                   [name], [name]    MBE/MEDQ  D:  07/10/2003  T:  07/10/2003  Job:  [id number]   cc:   Kizzie Furnish, [name]  [address]  [place]  [place] [address]  Fax: [phone]

## 2011-01-10 NOTE — Cardiovascular Report (Signed)
[place]. [facility]  Patient:    [name], [name]                        MRN: [id number] Proc. Date: 01/22/01 Adm. Date:  [id number] Attending:  Glenetta Hew CC:         Cardiac Catheterization Laboratory  Kizzie Furnish, [name]   Cardiac Catheterization  INDICATIONS FOR TEST:  Mr. Davidian is a 55 year old male who has had two cardiac catheterizations for chest pain, one in October 2001, and one in January 2002. Both of these catheterizations were very similar in that there was ostial diagonal disease.  The diagonals are very small vessels, as is his LAD and the high-grade stenosis in the proximal diagonal, which approaches 80% to 90%, was felt to be best managed with medical therapy alone.  He had done well on medical therapy, but presented to the emergency room today after having had intermittent episodes of chest pain for the last two days, an episode of severe chest pain today with loss of consciousness and bystander CPR at work where he responded in less than a minute.  EMS on the scene said he was bradycardic and hypotensive.  His initial EKG showed diffuse ST-T changes in the inferior and lateral leads that resolved with IV nitroglycerin and heparin.  His first cardiac enzymes was positive at 1.7, but he was completely pain free.  DESCRIPTION OF PROCEDURE:  The patient was prepped and draped in the usual sterile fashion, exposing the right groin.  Following local anesthetic with 1% Xylocaine, the Seldinger technique was employed and a 7-French introducer sheath was placed in the right femoral artery.  Selective right and left coronary arteriography and ventriculography in the RAO projection was performed.  EQUIPMENT:  Six French Judkins configuration catheters.  COMPLICATIONS:  None.  RESULTS:  HEMODYNAMIC MONITORING:  Central aortic pressure was 104/66.  Left ventricular pressure was 101/19, with no aortic valve gradient noted at time of  pullback.  VENTRICULOGRAPHY:  Ventriculography in the RAO projection revealed the distal septum, the apex, and the posterior basilar segments to be severely hypokinetic to almost akinetic.  The ejection fraction was approximately 40% to 45% and the end-diastolic pressure was 19.  CORONARY ARTERIOGRAPHY:  There was no calcification noted on fluoroscopy.  1. Left main:  Normal. 2. Left anterior descending:  The entire proximal segment of the LAD was    small, almost subtotaled in its entire length, all the way to the first    diagonal.  The first diagonal was small with ostial 90% lesion.  The    second diagonal was much larger than the first, but still less than    1.5 mm in diameter and had ostial 80% obstruction.  The distal LAD was    free of disease. 3. Circumflex:  The circumflex had mild irregularities in the distal OM-2. 4. Right coronary artery:  The right coronary artery was the largest of all    three vessels.  There was mild proximal irregularities with some catheter    induced proximal spasm.  The distal vessel was free of disease.  No    right-to-left collaterals.  The patient was given intracoronary nitroglycerin.  The anatomy was then reevaluated.  The proximal LAD segment now "normalized" back to its normal small diameter, but free of obstructions.  In addition to this, the irregularities in the distal circumflex also resolved.  The ostial disease in the first and second  diagonals, however, remained unchanged.  CONCLUSIONS: 1. Moderate left ventricular dysfunction with a 40% ejection fraction in    the anterior, apical, and posterior basilar segment.  Wall motion    abnormalities.  This is all completely different from the normal left    ventricular function he had in January 2001. 2. Coronary spasm involving the entire proximal left anterior descending    coronary artery segment and probably the distal circumflex.  I suspect    this is the etiology of his cardiac  event.  The disease in his diagonals    has not changed.  RECOMMENDATIONS:  There is nothing to offer interventional wise.  He needs to be maintained on calcium channel blockers, nitrates, and low-dose beta-blockers. DD:  01/22/01 TD:  01/22/01 Job: [id number]

## 2011-01-10 NOTE — Discharge Summary (Signed)
[place]. [facility]  Patient:    [name], [name]                        MRN: [id number] Adm. Date:  [id number] Disc. Date: [id number] Attending:  Loreli Dollar Dictator:   Adrian Saran, A.N.P.                           Discharge Summary  ADMITTING DIAGNOSES:  1. Chest pain rule out myocardial infarction.  2. Status post cardiac arrest with standard CPR on Jan 21, 2001. Previous     cath revealing small ostial diagonal disease. Questionable coronary     spasm.  DISCHARGE DIAGNOSES:  1. Small subendocardial myocardial infarction secondary to left anterior     descending spasm, Jan 22, 2001.  2. Status post cardiac arrest with brief CPR, Jan 22, 2001.  3. Musculoskeletal chest pain secondary to CPR.  PROCEDURES:  Cardiac catheterization.  COMPLICATIONS:  None.  DISCHARGE STATUS:  Stable.  ADMISSION HISTORY:  This is a 55 year old white male who came in after having suffered severe substernal chest pain on the morning of Jan 22, 2001. He was apparently at work when this occurred. He had left arm radiation and shortness of breath. Apparently he had collapsed and had a syncopal episode in the office at work. Was found "pulseless" by bystanders and had a brief minute or two of bystander CPR. EMS was called and apparently EMS was unable to obtain a blood pressure. He had been "revived" by the bystanders and was hence transported to [facility] ER.  Upon arrival at the ER, he was still complaining of chest pain. He received sublingual nitroglycerin by EMS. IV nitroglycerin was started in the ER and his pain was resolved. Vital signs were stable in the ER. EKG showed a sinus rhythm rate of 69 with some nonspecific ST and T wave changes. Initial EKG apparently showed some inferolateral T wave inversion; however, had improved to the second EKG. At this point, the heparin was also initiated, IV nitrate was continued and he was admitted for rule out MI and for  cardiac catheterization.  HOSPITAL COURSE:  The patient remained free of pain since his admission from the ER. Vital signs remained stable. EKG showed no further changes. CK-MB upon admission was a total of 111 with 5.8 MBs. Troponin was 1.76. On June 1, he had 7.4 MB, troponin was 4.4.  He underwent cardiac catheterization on the afternoon of Jan 22, 2001. Left heart cath revealed right femoral artery was performed without difficulty. Results showed normal LAD. Circumflex with mild irregularities distal to OM #2. LAD with a proximal segment small and almost subtotal to the first diagonal. The first diagonal showed small ostial of 90%. The second diagonal was larger than diagonal #1; however, still with 80% ostial. RCA showed mild proximal irregularities. We had hypokinetic LV function with an EF of 40% and EDP of 19. He was given intracoronary nitroglycerin to the proximal LAD which then normalized. These changes were clearly consistent with coronary spasm.  The patient had been encouraged in the past to use nitrates in order to benefit coronary blood flow; however, he vehemently refused secondary to interruptions in dysfunction with his sex life. It was strongly encouraged at this time that he discontinue the use of viagra due to the fact of having to use intracoronary nitroglycerin and his need for a long acting nitrate at this  time.  The patient remained pain free for the rest of the hospitalization. Vital signs remain stable. All labs remain within normal limits. Lipid profile that was done revealed a total of cholesterol of 179, HDL of 35, LDL of 113, triglycerides 914. He will be started on ______  as an outpatient.  He did develop some mild bibasilar rails on his second day of hospitalization. This was corrected with one single dose of IV Lasix. The rest of the hospitalization was uneventful and the patient was discharged home on January 25, 2001.  DISCHARGE MEDICATIONS:  1.  Enteric coated aspirin 325 mg q.d.  2. Plavix 75 mg q.d.  3. Atenolol 50 mg 1/2 q.d.  4. Procardia 30 mg one b.i.d.  5. Imdur 30 mg 1/2 q.d.  6. Nitroglycerin 0.4 mg sublingual p.r.n.  DISCHARGE INSTRUCTIONS:  1. The patient is not to use viagra.  2. No strenuous activity, lifting more than 5 pounds, driving or sexual     activity for three days following discharge.  3. He has been advised to refrain from any strenuous activity until     follow-up with Dr. Clarene Duke.  4. He is out of work until follow-up with Dr. Clarene Duke.  5. He needs to maintain low salt, low fat, low cholesterol diet.  6. He needs to refrain from caffeine, tobacco, over the counter     antihistamines and medications containing pseudoephedrine. He is to     watch the catheterization site at his right groin carefully for any     bruising, swelling. If he has that or increase in pain, he is to contact     Dr. Clarene Duke. He is to follow-up with Dr. Clarene Duke in two to three weeks after     discharge. He is to call for an appointment. DD:  02/05/01 TD:  02/06/01 Job: [id number]

## 2011-01-10 NOTE — Cardiovascular Report (Signed)
NAMEMAXON, KRESSE                           ACCOUNT NO.:  [id number]   MEDICAL RECORD NO.:  [id number]                   PATIENT TYPE:  OIB   LOCATION:  2899                                 FACILITY:  [facility]   PHYSICIAN:  [name], [name]             DATE OF BIRTH:  Jan 09, 1956   DATE OF PROCEDURE:  12/01/2002  DATE OF DISCHARGE:  12/01/2002                              CARDIAC CATHETERIZATION   PROCEDURES PERFORMED:  1. Left heart catheterization.  2. Coronary angiography.  3. Left ventriculogram.  4. Saphenous vein graft angiography.  5. Left internal mammary angiography.   CARDIOLOGIST:  [name], [name]   COMPLICATIONS:  None.   INDICATIONS FOR PROCEDURE:  The patient is a 55 year old male, patient of  Dr. Delories Heinz and Dr. Lenise Herald with a history of CAD status post  coronary artery bypass surgery in June of 2003 consisting of LIMA to the  LAD, vein graft to the diagonal, vein graft to the OM and vein graft to the  distal RCA.  The patient did have follow up cardiac catheterization  revealing significant lesion in the obtuse marginal beyond the vein graft  for which he underwent PTCA.  The patient recently complained of increasing  chest pain radiating to his left arm; is now referred for repeat  catheterization.   DESCRIPTION OF PROCEDURE:  After obtaining informed consent the patient was  brought to the cardiac catheterization lab.  The right groin was shaved,  prepped and draped in the usual sterile fashion.  ECG monitoring was  established.  Using the modified Seldinger technique a #6 French arterial  sheath was inserted in the right femoral artery.  Six [place] diagnostic  catheters and cine performed.  Diagnostic angiography revealed medium-sized  left main, which has no significant disease.   RESULTS:  Left Anterior Descending:  The LAD is a small vessel, which  appears diffusely diseased.  The mid and distal LAD filled via a patent LIMA  to  the LAD.  This LAD is a small vessel and has no significant stenosis.   Diagonal:  The diagonal fills via patent saphenous vein graft.  The diagonal  is a small branch.   Circumflex Artery:  The AV groove circumflex is small with diffuse disease.  There is one lower obtuse marginal which is small with no significant  disease.  The saphenous vein graft to the obtuse marginal is patent.  The  previous angioplasty site in the obtuse marginal is widely patent.  This  does retrogradely fill a lower obtuse marginal, which is patent.   Right Coronary Artery:  The right coronary artery is a small vessel, which  is dominant and gives rise to the PDA as well as the posterolateral branch.  The RCA is diffusely disease throughout its proximal, mid and distal  portions.  There is a saphenous vein graft inserted into the distal portion  of the RCA with 50% disease beyond the graft insertion.  The PDA and the  posterolateral branch were irregular, but had no high-grade stenosis.   LEFT VENTRICULOGRAM:  Left ventriculogram reveals mild __________ EF at 40%  with global hypokinesis.  There is posterior basilar hypokinesis.   HEMODYNAMICS:  Systemic arterial pressure 123/67.  LV systemic pressure  123/4.  LVEDP of 8.   CONCLUSION:  1. Significant three-vessel coronary artery disease.  2. Patent left internal mammary artery to the left anterior descending.  3. Patent vein graft to the diagonal.  4. Patent vein graft to the obtuse marginal.  5. Patent vein graft to the right coronary artery with 50% disease in the     right coronary artery beyond the graft insertion.  6. Mildly depressed __________ systolic function with wall motion     abnormalities as noted above.                                                 [name], [name]    RHM/MEDQ  D:  12/01/2002  T:  12/02/2002  Job:  [id number]   cc:   Kizzie Furnish, [name]  [address]  [place]  [place] [address]  Fax: [phone]

## 2011-01-10 NOTE — H&P (Signed)
NAMEQUANTA, [name]                 ACCOUNT NO.:  [id number]   MEDICAL RECORD NO.:  [id number]          PATIENT TYPE:  EMS   LOCATION:  MAJO                         FACILITY:  [facility]   PHYSICIAN:  [name], MD  DATE OF BIRTH:  [birth year]   DATE OF ADMISSION:  10/21/2005  DATE OF DISCHARGE:                                HISTORY & PHYSICAL   HISTORY OF PRESENT ILLNESS:  [name] is a 55 year old white man who is  admitted to [facility] for further evaluation of chest pain.  In  addition, he noted some visual changes which are also to be evaluated.   The patient has a history of coronary artery disease.  He has previously  suffered a myocardial infarction.  He underwent percutaneous coronary  intervention on his native circumflex in 2003 and then subsequently coronary  artery bypass surgery later that year.  Other medical problems include  hypertension and dyslipidemia.  The patient's primary care physician is  currently evaluating him for a chronic mild leukocytosis.  He is known to  have a mildly depressed left ventricular ejection fraction at 40%.  The  patient experienced an episode of chest pain which began this evening while  he was walking in the kitchen.  The chest pain was described as a pressure  across his anterior chest.  It did not radiate.  It was associated with  dyspnea and diaphoresis, but no nausea.  There were no exacerbating or  ameliorating factors.  It appeared not to be related to position, meals, or  respirations.  He took 1 nitroglycerin at home which did not seem to have  any effect.  EMS was summoned.  When they arrived, they administered  additional nitroglycerin.  The patient's chest pain eventually resolved  after 45 minutes.  At the onset of chest pain, the patient also noted blurry  vision.  This lasted for approximately 15 out of the 45 minutes.  It  resolved spontaneously.  The patient believes the chest pain is different  from his  prior cardiac chest pain.  In addition, he has not experienced the  blurry vision previously.  Prior to the onset of chest pain and blurry  vision, the patient noted that his systolic blood pressure was approximately  180.  The patient is free of chest pain, free of blurry vision, and  otherwise asymptomatic at this time.   MEDICATIONS:  Lotrel, Nexium, Toprol-XL, aspirin, and nitroglycerin.   ALLERGIES:  PENICILLIN and HEPARIN.   PREVIOUS OPERATIONS:  1.  Coronary artery bypass surgery.  2.  Inguinal herniorrhaphy.  3.  Cervical spine surgery.   SOCIAL HISTORY:  The patient lives with his wife.  He does not drink.  He  stopped smoking at the time of his myocardial infarction.  He is a heavy  [name].   FAMILY HISTORY:  Family history notable for coronary artery disease in both  parents.   REVIEW OF SYSTEMS:  Review of systems reveals no new problems related to his  head, eyes, ears, nose, mouth, throat, lungs,  gastrointestinal system,  genitourinary system, or extremities.  There is no history of neurologic or  psychiatric disorder.  There is no history of fever, chills or weight loss.   PHYSICAL EXAMINATION:  VITAL SIGNS:  Blood pressure 130/73.  Pulse 76 and  regular.  Respirations 20.  Temperature 98.5.  GENERAL:  The patient was a middle-aged white man in no discomfort.  He was  alert, oriented, appropriate, and responsive.  HEENT:  Head, eyes, nose, and mouth were normal.  NECK:  The neck was without thyromegaly or adenopathy.  Carotid pulses were  palpable bilaterally and without bruits.  CARDIAC:  Examination revealed a normal S1 and S2.  There was no S3, S4,  murmur, rub, or click.  Cardiac rhythm was regular.  No chest wall  tenderness was noted.  LUNGS:  The lungs were clear.  ABDOMEN:  The abdomen was soft and nontender.  There was no mass,  hepatosplenomegaly, bruit, distention, rebound, guarding, or rigidity.  Bowel sounds  were normal.  RECTAL AND GENITAL:  Examinations were not performed as they were not  pertinent to the reason for acute care hospitalization.  EXTREMITIES:  The extremities were without edema, deviation, or deformity.  Radial and dorsalis pedal pulses were palpable bilaterally.  NEUROLOGIC:  Brief screening neurologic survey was unremarkable.   LABORATORY AND ACCESSORY CLINICAL DATA:  The electrocardiogram revealed  normal sinus rhythm.  There was evidence of a prior inferior myocardial  infarction.  There were no changes specific for infarction.   The chest x-ray radiograph was pending at the time of this dictation.   Potassium was 3.8, BUN 10 and creatinine 0.7.  The initial set of cardiac  markers revealed a myoglobin of 58.7, CK-MB less than 1.0, and troponin less  than 0.05.  Sed rate was 4.  White count was 12.5 with a hemoglobin of 13.9  and hematocrit of 39.4.  The remaining studies were pending at the time of  this dictation.   IMPRESSION:  1.  Chest pain; rule out unstable angina.  2.  Visual changes; rule out transient ischemic attack.  3.  Coronary artery disease.  Status post myocardial infarction.  Status      post circumflex percutaneous intervention.  Status post coronary artery      bypass surgery.  4.  Mild ischemic cardiomyopathy.  Ejection fraction of 40%.  5.  Hypertension.  6.  Dyslipidemia.  7.  Chronic mild leukocytosis, currently being evaluated by the patient's      primary care physician.   PLAN:  1.  Telemetry.  2.  Serial cardiac enzymes.  3.  Aspirin.  4.  Intravenous heparin not given due to allergy.  5.  Intravenous nitroglycerin.  6.  Head CT.  7.  Echocardiogram.  8.  Carotid Dopplers.  9.  Further measures per [name].   COMMENT:  It is noted that the patient's last dose of Viagra was 4 days ago.  This should not interfere with the use of intravenous nitroglycerin today. Furthermore, the Viagra should be considered in the context of  his visual  disturbances.      Ulyses Amor, MD  Electronically Signed      [name], MD  Electronically Signed    MSC/MEDQ  D:  10/21/2005  T:  10/21/2005  Job:  [id number]

## 2011-01-10 NOTE — Cardiovascular Report (Signed)
[place]. [facility]  Patient:    [name], [name]                        MRN: [id number] Proc. Date: 08/28/00 Adm. Date:  [id number] Attending:  Loreli Fry CC:         [name], [name], [name], [place]  Cath Lab   Cardiac Catheterization  INDICATIONS:  [name] is a 55 year old male who has chest pain.  He was hospitalized in October and had a cardiac catheterization performed that showed some mild circumflex and ostial disease in the diagonals and has been treated medically.  Despite this he continued to have chest pain with multiple evaluations by his primary care doctor in the last three weeks.  A nuclear study performed at [facility] on August 24, 2000, failed to show ischemia, but the patient did develop exertional chest pain and had an abnormal blood pressure response with systolic pressure greater than 210.  Because of his continued complaints of chest pain he is brought back in for outpatient cardiac catheterization.  DESCRIPTION OF PROCEDURE:  The patient was prepped and draped in the usual sterile fashion exposing the right groin.  Following local anesthetic with 1% Xylocaine, the Seldinger technique was employed and a 6 [place] introducer sheath was placed in the right femoral artery.  Selective right and left coronary arteriography and subselective left subclavian injections were performed.  Ventriculography in the RAO projection was performed.  An aortic arch injection was performed and a distal aortogram was performed.  This was performed looking for other explanations for his chest pain and transient left arm numbness and weakness.  In addition to this, his renal arteries were evaluated with a distal aortogram because of his exaggerated blood pressure response.  RESULTS:  HEMODYNAMIC MONITORING:  Central aortic pressure 116/67, left ventricular pressure 113/14 with no aortic valve gradient noted at the time of  pullback.  VENTRICULOGRAPHY:  Ventriculography in the RAO projection using 20 cc of contrast at 12 cc per second revealed normal left ventricular systolic function.  The ejection fraction calculated at 56%.  The end diastolic pressure was 14 and there were no wall motion abnormalities.  No mitral regurgitation was seen.  AORTIC ARCH INJECTION:  The aortic arch was normal as was the proximal descending aorta.  The vessel was smooth with no evidence of intemal disruption or irregularities.  DISTAL AORTOGRAM:  Just above the level of the renals revealed the distal aorta to be smooth with no evidence of renal artery stenosis.  On fluoroscopy both kidneys appeared to be the same size.  SELECTIVE LEFT SUBCLAVIAN INJECTION:  Revealed the subclavian to be widely patent, the internal mammary artery to be patent, and the proximal portion of the carotid to be free of disease.  CORONARY ARTERIOGRAPHY:  No calcification was noted on fluoroscopy.  Left main normal.  LAD.  The LAD crossed the apex of the heart and was a very small vessel about 2.3 to 2.5 mm in diameter.  It had two very small diagonal branches, both less than 2 mm in diameter with ostial narrowing up to 70% in the second diagonal. Intracoronary nitroglycerin was given and there was no substantial change in the appearance of the vessels or in the appearance of the obstruction.  Circumflex.  The circumflex gave rise to one OM vessel.  The proximal portion of the OM had a 40% area of narrowing with what appeared to be systolic compression  of this area.  The distal OM was free of disease.  When intracoronary nitroglycerin was given, there was no change in the appearance of the vessel or in appearance of the systolic compression.  Right coronary artery.  The right coronary artery was a small vessel with about 2.75 mm in diameter.  There was actually damping of a 6 French catheter in the ostium. The vessel itself was free of  disease.  The PDA and posterolateral vessels had some mild irregularities and there were two PL branches.  There was no ostial narrowing of the RCA as documented by cusp injections.  CONCLUSION: 1. Normal left ventricular systolic function. 2. No evidence of any aortic dissection. 3. No evidence of renal artery stenosis. 4. No evidence of stenosis of the left subclavian or internal mammary artery. 5. Moderate disease involving the ostium of two very small diagonal branches    and a mild to moderate area of narrowing in the proximal portion of a    obtuse  marginal vessel.  Because of his continued complaints, I have decided to proceed on with attempts at angioplasty to the ostium of the second diagonal which appeared to be the more significant area of narrowing, but again would be very high risk because of the small diameter of both the diagonal and of the LAD.  He was given a total of 7700 units of intravenous heparin.  I asked Dr. Tresa Endo and Dr. Allyson Sabal both to evaluate his coronary angiograms before I proceeded and with no evidence of documented ischemia on a nuclear study and realizing that any type of intervention to these ostial lesions would be high risk, but in particular in view of the very small diameter of the LAD and the diagonals, this would be extremely high risk, the decision was made not to proceed on with percutaneous intervention.  Had we been able to demonstrate objective ischemia on the nuclear study, then perhaps intervention may have been a viable option, although, clearly a very high risk event.  Because of the intravenous heparin, the patient had to be admitted overnight because his sheath could not be removed until his ACT normalizes.  At this point, I will continue him on more aggressive medical therapy for what looks like it could potentially be angina, but will also request that Dr. Fayrene Fearing proceed on with complete GI evaluation including esophageal  manometry.   He has complained of atypical left arm numbness, paralysis, and headaches and will probably need a CT or MRI to evaluate this complaint. DD:  08/28/00 TD:  08/28/00 Job: [id number]

## 2011-01-10 NOTE — Cardiovascular Report (Signed)
[place]. [facility]  Patient:    AVA, DEGUIRE Visit Number: [id number] MRN: [id number]          Service Type: MED Location: 2000 2001 01 Attending Physician:  Waldo Laine Dictated by:   Delrae Rend, [name] Proc. Date: 02/17/02 Admit Date:  02/16/2002 Discharge Date: 03/06/2002   CC:         Kizzie Furnish, [name]  [facility], [address] [address]   Cardiac Catheterization  ATTENDING PHYSICIAN:  Kizzie Furnish, [name]  PROCEDURES PERFORMED: 1. Left ventriculography. 2. Selective right and left coronary arteriography. 3. Intracoronary administration of nitroglycerin. 4. Intravascular ultrasound interrogation of the left anterior descending    artery. 5. Left subclavian angiography with visualization of the left internal mammary    artery.  INDICATIONS:  The patient is a 55 year old gentleman with a history of inferolateral wall myocardial infarction in 2002.  Presents to the hospital with chest pain very suggestive of unstable angina.  Because of continued chest pain, he was brought directly to the cardiac catheterization lab to evaluate his coronary anatomy.  HEMODYNAMIC DATA:  The left ventricular pressure was 128 with an end diastolic pressure of 13 mmHg.  ANGIOGRAPHIC DATA:  LEFT VENTRICULOGRAPHY:   Left ventricular systolic function was estimated at 55%.  There is basal inferior and inferolateral wall hypokinesis.  There was no significant mitral regurgitation.  CORONARY ARTERIOGRAM: 1. Right coronary artery:  The right coronary artery is a large caliber    vessel.  It has proximal spasm.  This spasm was relieved with intracoronary    administration of nitroglycerin.  2. Left main coronary artery:  The left main coronary artery is a large    caliber vessel.  It is disease-free.  3. Circumflex coronary artery:  The circumflex coronary artery is a large    caliber vessel.  It continues as a  large obtuse marginal #1 after giving    origin to an AV groove circumflex.  The circumflex itself is disease-free.  4. Left anterior descending artery:  The left anterior descending artery is a    large caliber vessel.  The ostium of the left anterior descending artery    has about 70-80% angiographic stenosis.  The mid segment has about 30-40%    luminal irregularity with 90-95% stenosis of a large diagonal #2 in the mid    segment.  LEFT SUBCLAVIAN ARTERY:  The left subclavian artery is widely patent with a widely patent LIMA.  IVUS INTERROGATION:  The intravascular ultrasound interrogation was performed to evaluate the severity of stenosis of the mid left anterior descending artery at the bifurcation of the diagonal #2.  The IVUS interrogation revealed the mid LAD which was angiographically 30-40% stenosed was packed with soft plaque.  This LAD measured 3.5 mm.  The ostium of the left anterior descending artery had 70-80% angiographic stenosis which was also confirmed by intravascular ultrasound interrogation.  It is a 5.5-mm vessel.  IMPRESSION: 1. Normal left ventricular systolic function.  Ejection fraction 55% with    basal inferior and inferolateral hypokinesis. 2. Spasm of the proximal right coronary artery which is probably    catheter-induced, relieved with intracoronary nitroglycerin administration. 3. Ostial left anterior descending artery, 70-80%, stenosis by angiogram    which was confirmed by IVUS and mid left anterior descending artery    stenosis which was 30-40% by angiography was actually packed with soft    plaque, a 90-95% stenosis of a  large diagonal #2. 4. Normal circumflex coronary artery.  RECOMMENDATIONS:  Based on his anatomy, the patient will benefit from coronary artery bypass grafting.  Hence, a surgical consultation will be obtained.  TECHNIQUE OF DIAGNOSTIC CARDIAC CATHETERIZATION:  Under usual sterile precautions using a #6 French right femoral  arterial access, a #6 [place] Multipurpose B2 catheter was advanced to the ascending aorta over a 0.035-inch J wire.  The catheter was gently advanced to the left ventricle and left ventricular pressures were monitored.  Hand contrast injection of the left ventricle was performed both in the LAO and RAO projections.  The catheter was flushed with saline and pulled back into the ascending aorta.  Pressure gradient across the aortic valve was monitored.  The right coronary artery was selectively engaged and angiography was performed.  Intracoronary nitroglycerin was administered.  Angiography was then performed.  Then the catheter was pulled back into the arch of the aorta and left subclavian artery was selectively cannulated and angiography was performed.  Then the catheter was pulled out of the body over a 0.035-inch J wire.  A #6 [place] Judkins diagnostic left catheter was advanced into the ascending aorta over a 0.035-inch J wire.  The left main coronary artery was selectively and angiography was performed.  Then the catheter was pulled out of the body over the 0.035-inch J wire.  TECHNIQUE OF IVUS INTERROGATION:  A #6 [place] ______ sheath was exchanged for a #7 [place] sheath.  Then a #7 [place] FL4 guide was advanced into the ascending aorta over the 0.035-inch J wire.  The left main coronary artery was selectively engaged and angiography was performed.  Then a 180-cm x 0.014-inch luge wire was advanced into the left anterior descending artery.  The tip of the wire was carefully positioned in the distal left anterior descending artery.  A Scimed Atlantis IVUS catheter was advanced into the left anterior descending artery and the catheter was positioned in the mid left anterior descending artery and automatic pullback was performed.  The IVUS images were carefully reviewed.  Then the guidewire and the IVUS catheter were removed out  of the body and angiography was performed.  Then the  guide was disengaged from the left main coronary artery and pulled out of the body over the 0.035-inch J wire.  The arterial access was sutured in place and the patient was transferred to recovery in stable condition.  During the procedure, intravenous heparin was administered and the ACT was maintained at a therapeutic range.  The patient tolerated the procedure well. Dictated by:   Delrae Rend, [name] Attending Physician:  Waldo Laine DD:  03/13/02 TD:  03/16/02 Job: [id number]

## 2011-01-10 NOTE — Cardiovascular Report (Signed)
[name], [name]                 ACCOUNT NO.:  [id number]   MEDICAL RECORD NO.:  [id number]          PATIENT TYPE:  OIB   LOCATION:  6527                         FACILITY:  [facility]   PHYSICIAN:  [name], MD  DATE OF BIRTH:  11-Dec-1955   DATE OF PROCEDURE:  11/21/2005  DATE OF DISCHARGE:  11/22/2005                              CARDIAC CATHETERIZATION   PROCEDURES:  1.  Left heart catheterization.  2.  Coronary angiography.  3.  Left ventriculogram.  4.  Saphenous vein graft.  5.  Left internal mammary angiography.   COMPLICATIONS:  None.   INDICATIONS FOR PROCEDURE:  [name] is a 55 year old male, patient of  Lonie Peak at [facility], with a history of CAD, status  post coronary artery bypass surgery in 2003, consisting of a LIMA to the  LAD, a vein graft diagonal, a vein graft to the OM, vein graft to the RCA.  He has had subsequent angioplasty to the OM through the vein graft.  Recently, he has complained of some mild chest pain with followup Cardiolite  on October 24, 2005, suggesting mild lateral ischemia with normal ejection  fraction. He is now referred for repeat catheterization to re-assess his  grafts.   DESCRIPTION OF PROCEDURE:  After getting informed written consent, the  patient was prepped and draped in cardiac catheterization lab. The right and  left groin were shaved, prepped, and draped in the usual sterile fashion.  ACT monitoring was established. Using modified Seldinger technique, a #6-  [place] arterial sheath was inserted in the right femoral artery. A 6-French  diagnostic catheter was used to performed diagnostic angiography.   RESULTS:  The left main is a small to medium size vessel with no significant  disease.   The LAD is a small vessel, which is septally occluded in its proximal  portion. The mid and distal LAD filled via patent LIMA with no significant  disease in the IMA or distal IMA insertion. There is good  retrograde fill in  the grafted diagonal which also retrograde fills the vein graft to the  diagonal.   There is a patent vein graft to the first diagonal which has no significant  disease in the graft or distal graft insertion. The left circumflex is a  small vessel which courses the AV groove. It has 70% proximal disease in the  subtotal and distal portion. There is one small obtuse marginal which is  totally occluded. There is a patent vein graft to the first OM which is  widely patent with no significant disease beyond the graft. This does  retrograde fill the AV graft circumflex and the lower OM.   The right coronary artery is a small, diffusely disease vessel which is  subtotally occluded in its mid portion with faint right to right collaterals  with distal RCA. There is a patent saphenous vein graft to the distal RCA  with 50% to 60% disease in the distal RCA beyond the graft. The PD and  posterolateral branch were small to medium size vessels with no significant  disease.  The left ventriculogram reveals a low normal EF of approximately 50% with  posterobasilar hypokinesis.   HEMODYNAMIC SYSTEM:  Arterial pressure 122/71, LV systolic pressure 122/12,  LVEDP of 19.   CONCLUSION:  1.  Significant three-vessel coronary artery disease.  2.  Patent left internal mammary artery to left anterior descending.  3.  Patent vein graft to the diagonal with no disease beyond the graft.  4.  Patent vein graft to the OM with no disease beyond the graft.  5.  Patent vein graft to the right coronary artery with 50% to 60% scattered      disease beyond the graft.  6.  Low normal ejection fraction with wall motion abnormalities as noted      above.  7.  Elevated left ventricular end-diastolic pressure.      [name], MD  Electronically Signed     RHM/MEDQ  D:  11/21/2005  T:  11/23/2005  Job:  [id number]   cc:   [facility]

## 2011-01-10 NOTE — Cardiovascular Report (Signed)
NAME:  [name], [name] NO.:  [id number]   MEDICAL RECORD NO.:  [id number]                   PATIENT TYPE:   LOCATION:                                       FACILITY:  [facility]   PHYSICIAN:  Vonna Kotyk R. Jacinto Halim, [name]                  DATE OF BIRTH:  12-May-1956   DATE OF PROCEDURE:  06/10/2002  DATE OF DISCHARGE:                              CARDIAC CATHETERIZATION   PROCEDURE PERFORMED:  Left heart catheterization including:  1. Left ventriculography.  2. Selective left and right coronary arteriography.  3. Saphenous vein graft and left internal mammary artery angiography.  4. Intracoronary nitroglycerin administration.   CARDIOLOGIST:  Cristy Hilts. Jacinto Halim, [name]   INDICATIONS FOR PROCEDURE:  The patient is a 55 year old gentleman with  history of severe coronary artery disease, coronary vasospasm, history of  coronary artery bypass grafting who was admitted directly from the office.  He also presents with chest pain very typical for unstable angina.  He was  brought directly to the catheterization lab to evaluate his coronary  anatomy.   FINDINGS:  HEMODYNAMIC DATA:  The left ventricular pressures were 118/4, end-  diastolic pressure of 11 mmHg.  The aortic pressure was 116/94 with a mean  of 95 mmHg.  There was no __________.   LEFT VENTRICULOGRAPHY:  The left ventricular systolic function was normal  with ejection fraction estimated at 60%.   CORONARY ARTERIES:  1. Right coronary artery:  The right coronary artery is a dominant artery.     Is has about 80% ostial disease.  There is spasm and diffuse disease     noted in the mid segment.  Competitive flow is noted in the distal bed.   1. Left main coronary artery:  The left main coronary artery is a small to     moderate size caliber vessel.  It has mild disease.   1. Circumflex coronary artery:  The circumflex coronary artery is a large     vessel, however, it has severe diffuse.  The proximal segment has a  long     segment 60% stenosis.  It gives origin to a very large obtuse marginal I     which is a long segment 90% stenosis.  The first marginal I gives     secondary branches.  One of the branches has 99% stenosis just after the     insertion of the SVG graft.   1. Left anterior descending artery:  The left anterior descending artery is     a large stable vessel.  It has significant diffuse disease in the     proximal segment constituting in 90% stenosis.  The mid to distal LAD is     widely patent and is supplied by LIMA.   1. The diagonal I branch of the left anterior descending has a proximal 90%  stenosis and distally is supplied by saphenous venous graft.  The     diagonal I itself had diffuse spasm.   1. Saphenous vein graft to the right coronary artery is widely patent.   1. Saphenous vein graft to diagonal I is widely patent.  Initially there was     slow filling dye hang up noted which rapidly cleared with intracoronary     administration of nitroglycerin with relief of spasm in the mid to     diagonal I.   1. Saphenous vein graft to obtuse marginal I is widely patent.  However,     there is a 99% stenosis after the insertion of the graft to the native     vessel.   1. The left internal mammary artery to the LAD is widely patent.   IMPRESSION:  1. Single spasm of the right coronary artery including diagonal I.     Partially relieved with intracoronary nitroglycerin.  2. Severe progression of native coronary artery disease.  3. Culprit obtuse marginal 99% stenosis after the saphenous vein graft     insertion.  The saphenous vein graft supplies a large second obtuse     marginal branch.   RECOMMENDATIONS:  1. IV Integrelin.  2. Re-evaluation for possible PCI.  3. High risk of stent failing of the large second branch of the native     circumflex coronary artery.  4. Will discuss with my colleagues regarding the approach.   DESCRIPTION OF PROCEDURE:  Under the usual  sterile preparation, a 6 French  right femoral artery access, a 6 [place] multipurpose __________  catheter  was advanced to the ascending lateral over a 0.025 J-wire.  The catheter was  then advanced to the left ventricle.  Left ventricular pressures were  monitored.  Hand contrast injection of the left ventricle was performed in  both the LAO and RAO projection.  The catheter was flushed with heparin and  pulled back into the ascending aorta.  Pullback pressures were monitored.   The right coronary artery was selectively engaged and angiography was  performed.  Then the catheter was pulled manipulated to engage the SVG to  the obtuse marginal, SVG to diagonal I.  Then the catheter was pulled out of  the body.  Then a 6 [place] Judkins left diagnostic catheter was advanced to  the ascending aorta, and the left main coronary artery was selectively  engaged, and angiography was performed.  Then the aortic catheter was pulled  out of the body.  Then a 6 [place] Judkins right diagnostic catheter was  advanced into the ascending aorta.  The right coronary artery was  selectively engaged and angiography was performed.  Again, the SVG to the  right coronary artery was attempted to engage, however, because of  difficulty in engaging the catheter was changed to an AL2.  Again, it was  difficult to engage the SVG to right coronary artery.  Hence a third  catheter that was an AR2 catheter was advanced in a similar fashion and the  SVG to the right coronary artery was selectively engaged, and angiography  was performed.  Thus, this catheter was then pulled out of the body in the  usual fashion.  Before pulling the Judkins right diagnostic catheter the  catheter was pulled back into the arch of the aorta, and the left subclavian  artery was cannulated, and the catheter was then advanced to engage the left internal mammary artery and selective angiography of the left internal  mammary artery was  performed.  During the procedure, multiple injections of  intracoronary nitroglycerin were administered to evaluate for possible  coronary spasm.  There was severe diffuse spasm noted in the diagonal I  branch of the left anterior descending artery and also proximal right  coronary artery.   After obtaining adequate views, the catheters were pulled out of the body in  the usual fashion, and the patient was transferred to the recovery room in  stable condition.  The patient tolerated the procedure well.  __________.                                              Cristy Hilts. Jacinto Halim, [name]   Pilar Plate  D:  06/10/2002  T:  06/12/2002  Job:  [id number]

## 2011-01-10 NOTE — Op Note (Signed)
[place]. [facility]  Patient:    NORMAN, PIACENTINI Visit Number: [id number] MRN: [id number]          Service Type: MED Location: 2300 [id number] Attending Physician:  Waldo Laine Dictated by:   Gwenith Daily Tyrone Sage, [name] Proc. Date: 02/18/02 Admit Date:  02/16/2002   CC:         Madaline Savage, [name]   Operative Report  PREOPERATIVE DIAGNOSIS:  Coronary occlusive disease.  POSTOPERATIVE DIAGNOSIS:  Coronary occlusive disease.  OPERATION PERFORMED:  Coronary artery bypass grafting times four with the left internal mammary artery to the left anterior descending coronary artery, reversed saphenous vein graft to the diagonal coronary artery, reversed saphenous vein graft to the circumflex coronary artery, reversed saphenous vein to the distal right coronary artery.  SURGEON:  Gwenith Daily. Tyrone Sage, [name]  FIRST ASSISTANT:  Salvatore Decent. Dorris Fetch, [name]  SECOND ASSISTANT:  Tollie Pizza. Thomasena Edis, [name]  ANESTHESIA:  General.  INDICATIONS FOR PROCEDURE:  The patient is a 55 year old male who 18 months prior had presented with out of hospital cardiac arrest.  He was evaluated at that time and has since been treated medically.  He returned with recurrent anginal symptoms.  He underwent cardiac catheterization which had an unusual pattern including obvious spasm in the right coronary artery during the procedure which was relieved.  In general, his distal vessels appeared to be small.  He had significant disease in the left anterior descending at the take off of the diagonal.  This circumflex coronary artery had some proximal irregularity but no high grade stenosis.  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  In addition, angiography and IVUS confirmed significant at least LAD and diagonal disease. It was not amenable to angioplasty.  Coronary artery bypass grafting was recommended to the patient.  The  patient underwent placement of a Swan-Ganz and arterial line without incident. He was transported into the operating room and underwent general endotracheal anesthesia. Initially this was without incident.  However, just as the draping of patient was being completed, the developed a widened QRS complex, bradycardia and severe hypotension. Immediate CPR was started.  He initially did not respond to usual pharmacologic measures.  CPR was started and as quickly as possible, the chest was opened.  The right atrium and the right ventricle were severely distended.  At this point  the patient had evidence of ventricular fibrillation.  Open chest massage was done briefly.  The patient was defibrillated and had return of heart rhythm.  During this time, the patient was systemically heparinized but continued as quickly as possible to cannulate the ascending aorta and right atrium and place the patient on cardiopulmonary bypass.  This decreased the distention, obvious in the right atrium and right ventricle and returned perfusion.  With this, the EKG returned to near normal appearing ST segments. The segment of vein was harvested from the right lower extremity and while this was proceeding also the left internal mammary artery was dissected down. It was of good quality and caliber and had an excellent flow.  The patients body temperature was cooled to 30 degrees, the aortic crossclamp was applied. 500 cc of cold blood potassium cardioplegia was administered with diastolic arrest of the heart.  Attention was then turned first to the distal right coronary artery.  Because of the patients traumatic hemodynamic effect of what has presumed to be severe spasm in the right coronary artery, the distal  right coronary artery was opened and was of reasonable size easily admitting a 1.5 mm probe, in fact appeared larger than appreciated on the angiogram.  A segment of reversed saphenous vein was anastomosed to the  distal right coronary artery.  Additional cold blood cardioplegia administered down the vein graft.  Attention was then turned to the circumflex coronary artery. Although this did not have high grade stenosis at the time of angiography, there was in the very proximal portion of the circumflex slightly narrowing and a concern whether this could also have spasmed.  We bypassed the circumflex coronary artery.  The diagonal coronary artery was relatively small but did admit a 1 mm probe.  Using a running 7-0 Prolene suture  distal anastomosis was performed.  Additional cold blood cardioplegia was administered down the free vein graft.  Attention was then turned to the left anterior descending coronary artery which was opened in the midportion.  Using a running 8-0 Prolene, the left internal mammary artery was anastomosed to the left anterior descending coronary artery.  With release of the Edwards bulldog on the mammary artery, there was appropriate rise in myocardial septal temperature.  The aortic cross clamp was removed.  Total cross clamp time was removed.  Total cross clamp time was 42 minutes.  The patient spontaneously converted to a sinus rhythm with normal-appearing ST  segments.  A partial occlusion clamp was placed on the ascending aorta.  Three punch aortotomies were performed.  Each of the three vein grafts were anastomosed to the ascending aorta.  Air was evacuated from the grafts.  The partial occlusion clamp was removed.  Sites of anastomosis were inspected and free of bleeding. The patient was then ventilated and weaned from cardiopulmonary bypass on low dose dopamine.  He remained hemodynamically stable and in spite of the initial presentation and beginning of the case he separated from bypass without difficulty and was hemodynamically stable with normal-appearing ST segments. Site of anastomosis were inspected and free of bleeding.  Two atrial and two ventricular pacing wires  were applied.  Graft markers were applied.  A left  pleural tube and two mediastinal tubes were left in place.  The sternum was closed with #6 stainless steel wire, the fascia was closed with interrupted 0 Vicryl and running 3-0 Vicryl in the subcutaneous tissue and 4-0 subcuticular stitch in the skin edges.  Dry dressings were applied.  The sponge and needle counts were reported as correct at completion of the procedure.  The patient was transferred to the surgical intensive care unit for further postoperative care. Dictated by:   Gwenith Daily Tyrone Sage, [name] Attending Physician:  Waldo Laine DD:  02/20/02 TD:  02/21/02 Job: [id number]

## 2011-01-10 NOTE — Discharge Summary (Signed)
[place]. [facility]  Patient:    [name], [name]                        MRN: [id number] Adm. Date:  [id number] Disc. Date: [id number] Attending:  [place], Peter Manning CC:         Kizzie Furnish, [name]   Discharge Summary  HISTORY OF PRESENT ILLNESS:  [name] is a 55 year old white male with a history of chronic tobacco abuse, hypertension, and a family history of early coronary disease, who presented to the emergency room with prolonged chest pain.  He awoke with pain associated with nausea and diaphoresis.  He did receive nitroglycerin in the ambulance and by the time he got to the emergency room, his pain had abated.  Of note, the patient had a prior exercise stress test in March of 1999, which was negative for ischemia.  He did have a recent tooth extraction on June 12, 2000.  PAST MEDICAL HISTORY:  For details, please see the admission history and physical.  SOCIAL HISTORY:  For details, please see the admission history and physical.  FAMILY HISTORY:  For details, please see the admission history and physical.  PHYSICAL EXAMINATION:  For details, please see the admission history and physical.  LABORATORY DATA:  The ECG was normal.  The chest x-ray showed minimal right basilar atelectasis, otherwise normal.  The white count was mildly elevated at 12,000, hemoglobin 13.9, hematocrit 38.3, and platelets 237,000.  The BMET was normal.  Coagulation times were normal.  The CK was 251 with negative MB and troponin 0.04.  HOSPITAL COURSE:  The patient was admitted and treated with IV nitroglycerin and heparin.  Subsequent ECGs were all normal.  Serial cardiac enzymes showed negative CPK-MB x 3.  His next troponin was elevated at 0.18, but then returned to normal at 0.03.  On May 16, 2000, the patient underwent cardiac catheterization.  This demonstrated mild nonobstructive coronary disease.  There were 20% narrowings in the proximal and mid LAD  and mid left circumflex coronary and the proximal right coronary artery.  The first diagonal had a 40-50% narrowing in its ostium.  Left ventricular function was normal.  Based on these findings, it was felt that the patients pain was noncardiac.  He had no further pain in the hospital and was treated with Protonix.  He was stable for discharge and was discharged on this medication.  DISCHARGE DIAGNOSES: 1. Chest pain, noncardiac. 2. Mild nonobstructive coronary disease. 3. Hypertension. 4. Tobacco abuse.  DISCHARGE MEDICATIONS: 1. Atenolol 50 mg per day. 2. Triamterene hydrochlorothiazide 75/50 mg per day. 3. Claritin p.r.n. 4. Protonix 40 mg per day.  FOLLOW-UP:  The patient is to follow up with his primary physician, Kizzie Furnish, [name], for further evaluation.  DISCHARGE STATUS:  Improved. DD:  06/15/00 TD:  06/16/00 Job: [id number]

## 2011-01-10 NOTE — Cardiovascular Report (Signed)
[place]. [facility]  Patient:    [name], [name]                        MRN: [id number] Proc. Date: 06/15/00 Adm. Date:  [id number] Attending:  [place], Peter Manning CC:         Kizzie Furnish, [name]   Cardiac Catheterization  INDICATIONS:  The patient is a 55 year old white male with multiple cardiac risk factors, including hypertension, tobacco abuse, and family history of early coronary disease, who presented with prolonged chest pain.  ACCESS:  Via the right femoral artery using the standard Seldinger technique.  EQUIPMENT:  Six French 4 cm right and left Judkins catheters, 6-French pigtail catheter, 6-French arterial sheath.  MEDICATIONS:  Local anesthesia with 1% Xylocaine, nitroglycerin 200 mcg intracoronary x 1.  CONTRAST:  Omnipaque 130 cc.  HEMODYNAMIC DATA:  Aortic pressure 104/55, with a mean of 73 mmHg.  Left ventricular pressure 104, with an EDP of 8 mmHg.  ANGIOGRAPHIC DATA: 1.  The left coronary artery arises and distributes normally.  The left main     coronary artery is normal. 2.  The left anterior descending artery is somewhat small in caliber.  There     are mild 20% narrowings in the proximal vessel and at the takeoff of the     first diagonal branch.  The first diagonal branch has some narrowing at     the takeoff of approximately 40% to 50%. 3.  The left circumflex coronary artery gives rise to a single large obtuse     marginal vessel.  There is again 20% narrowing in the circumflex in the     mid vessel. 4.  The right coronary artery is a large dominant vessel.  There was mild     catheter induced spasm in the proximal vessel, which resolved with     intracoronary nitroglycerin.  There were only minor wall irregularities of     less than 10% in the right coronary artery.  LEFT VENTRICULOGRAM:  Left ventricular angiography was performed in the RAO view.  This demonstrates normal left ventricular size and contractility,  with normal systolic function.  Ejection fraction is estimated at 65%.  There is no mitral regurgitation or prolapse.  The aortic valve appears normal.  FINAL INTERPRETATION: 1.  Mild nonobstructive atherosclerotic coronary artery disease. 2.  Normal left ventricular function. DD:  06/15/00 TD:  06/15/00 Job: [id number]

## 2011-01-10 NOTE — H&P (Signed)
[name], [name]                           ACCOUNT NO.:  [id number]   MEDICAL RECORD NO.:  [id number]                   PATIENT TYPE:  INP   LOCATION:  2924                                 FACILITY:  [facility]   PHYSICIAN:  Dani Gobble, MD                    DATE OF BIRTH:  07-05-1956   DATE OF ADMISSION:  07/06/2003  DATE OF DISCHARGE:                                HISTORY & PHYSICAL   CHIEF COMPLAINT:  Chest pain.   HISTORY OF PRESENT ILLNESS:  A 55 year old white married male with history  of coronary artery bypass grafting in January 2003 with LIMA to the LAD,  saphenous vein graft to the diagonal, saphenous vein graft to the OM, and  saphenous vein graft to the distal RCA by Dr. Tyrone Sage with postoperative  complications of arrest and ARDS.   The patient recovered and since that time did have a positive Cardiolite in  October 2003, underwent cardiac catheterization, and had a significant  lesion in obtuse marginal beyond the vein graft.  He underwent angioplasty  to that site.  Since that time, he was again catheterized in April 2004  after presenting complaining of increasing chest pain radiating to his left  arm.  That catheterization revealed patent grafts and patent angioplasty  site in the obtuse marginal.   On the day of admission, the patient had been in his usual state of health  when, at approximately 10:45, he had severe mid sternal chest pain, sharp  and stabbing pain that felt like a knife.  He took 2 sublingual  nitroglycerin that were new without any relief and came to the emergency  room.  He did have pain increased with deep breaths.   In the hospital originally, his blood pressure was 179/103, but with needle  stick, his blood pressure dropped to at least in the 90s, and heart rate  dropped to 30s to 40s.  That was before any medications were given in the  emergency room.  IV fluids were given.  The patient was placed in  Trendelenburg, and blood  pressure recovered significantly.  The patient's  pain was a 5/10 mid sternum.  He was started on IV nitroglycerin and IV  morphine.  He does have a history of coronary spasm additionally.   PAST MEDICAL HISTORY:  1. Coronary artery disease.  EF was 40% on catheterization.  2. Hypertension.  3. Hyperlipidemia.   FAMILY HISTORY:  No significant for this admission.   ALLERGIES:  HEPARIN has caused thrombocytopenia.  Also allergic to  PENICILLIN.   OUTPATIENT MEDICATIONS:  1. Imdur 30 mg daily.  2. Norvasc 5 mg daily.  3. Altace 2.5 mg daily.  4. Enteric-coated aspirin 81 mg daily  5. Zocor 40 mg q.h.s.  6. Toprol XL 25 mg 1/2 daily.  7. Nexium 40 mg daily.   REVIEW OF SYSTEMS:  All were  negative.  No chest pain recently until today.  Denies edema.  Lungs negative.  HEENT: Negative.  GU: Negative.  GI:  Negative history of diarrhea.   SOCIAL HISTORY:  He is married.  His family is with him in the emergency  room.   PHYSICAL EXAMINATION:  VITAL SIGNS:  Blood pressure initially 179/103 then  down to 90/60.  Prior to leaving ED, he was 133 systolic.  Temperature 98.1,  pulse 111.  Room air oxygen saturation 98%.  GENERAL:  Alert and oriented white male x 3 but in distress with pain and  anxiety.  NEUROLOGIC:  Moves all extremities, anxious, frightened.  HEENT:  Sclerae clear.  No acute changes.  NECK:  No definite JVD, but difficult to assets secondary to shivering.  No  bruits.  CHEST:  Clear to auscultation bilaterally.  Chest pain reproducible with  palpation to left chest.  ADENOPATHY:  Not obviously.  HEART:  Regular rate and rhythm.  Distant S1, S2.  No obvious murmur,  gallop, rub, or click.  ABDOMEN:  Soft, nontender.  Positive bowel sounds.  GU:  Deferred.  EXTREMITIES:  No edema, 2+ pedal bilaterally, 2+ radial.  SKIN:  Warm and dry.   LABORATORY DATA:  Chest x-ray revealed mild edema.   Hemoglobin 14.7, hematocrit 42.6, WBC 9.7, platelets 62, monos 6, eos 5,   basos 1, neutrophils 61, lymphs 27.  Coags: Pro time 12.6, INR 0.9, PTT 22.  Sodium 140, potassium 4.1, chloride 108, CO2 24, glucose 105, BUN 8,  creatinine 0.7, total bilirubin 0.8, alkaline phosphatase 115, SGOT 33, SGPT  39, total protein 7.0, albumin 4.5, calcium  9.4, magnesium 2.1.  Troponin I  less than 0.01.  CK-MB less than 1.0, troponin 0.05, myoglobin 35.7.  BNP  less than 30.  D-dimer 3.23.  Again, his platelet count was 62,000 confirmed  by smear.   EKG initially sinus rhythm, no acute changes.  Followup bradycardia.  Heart  rate 47, again no acute changes.   ASSESSMENT:  1. Chest pain, rule out myocardial infarction.  2. Rule out pulmonary embolus, rule out aortic dissection.  3. Elevated D-dimer.  4. History of coronary disease with history of angioplasty below his graft     insertion site with bypass grafting in 2003.  5. Anxiety issues.   PLAN:  1. Admit to CCU.  2. IV nitroglycerin.  3. Will go ahead and get a CT of his chest to rule out aortic aneurysm and     PE.  4. Concern with platelets.  5. D-dimer elevated.  6. Cardiac catheterization dependent on CT.  7.     Relief of pain.  8. Serial CK-MBs.   Dr. Domingo Sep saw the patient and assessed him also.      Darcella Gasman. [place], N.P.                     Dani Gobble, MD    LRI/MEDQ  D:  07/06/2003  T:  07/06/2003  Job:  [id number]   cc:   Kizzie Furnish, [name]  [address]  [place]  [place] [address]  Fax: [phone]

## 2011-01-10 NOTE — Discharge Summary (Signed)
[name], [name]                 ACCOUNT NO.:  [id number]   MEDICAL RECORD NO.:  [id number]          PATIENT TYPE:  INP   LOCATION:  5524                         FACILITY:  [facility]   PHYSICIAN:  [name], MD  DATE OF BIRTH:  [birth year]   DATE OF ADMISSION:  11/24/2004  DATE OF DISCHARGE:  11/25/2004                                 DISCHARGE SUMMARY   ADMISSION DIAGNOSES:  1.  Left neck, left shoulder, and left arm pain suspected to be      musculoskeletal and neurologic etiology. Doubt cardiac etiology.  2.  History of coronary artery disease with history of myocardial infarction      and history of circumflex intervention, and status post coronary artery      bypass graft.  3.  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  4.  Hypertension.  5.  Hyperlipidemia.   DISCHARGE DIAGNOSES:  1.  Left neck, left shoulder, and left arm pain suspected to be      musculoskeletal and neurologic etiology. Doubt cardiac etiology.  2.  History of coronary artery disease with history of myocardial infarction      and history of circumflex intervention, and status post coronary artery      bypass graft.  3.  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  4.  Hypertension.  5.  Hyperlipidemia.  6.  Status post neurology consultation by Dr. Avie Echevaria, status post C-      spine film and MRI. Felt to have cervical radiculopathy with plans for      steroid taper and physical therapy.   HISTORY OF PRESENT ILLNESS:  Mr. Markell is a 55 year old white male with a  history of coronary artery disease. He suffered an MI previously. He has  undergone intervention and subsequent bypass surgery. He also has a history  of hypertension and hyperlipidemia. He was just recently in the hospital  last week with neck pain and shoulder pain. He was discharged home, but came  back on November 24, 2004, with continuous discomfort in the left neck, left  shoulder, and left arm.  It was exacerbated by  movement of his arm and neck.  There was no chest pain, no dyspnea, no diaphoresis, or nausea. It did not  appear to be related to meals or respiration. It was completely different  from his prior cardiac chest pain.   During the recent admission his cardiac enzymes had been negative. His CT  scan showed no pulmonary embolism or aortic dissection.   He now comes back in and is evaluated by Dr. Lemmie Evens. It is felt  that his discomfort is noncardiac etiology and it is felt to be  musculoskeletal and neurologic etiology. At that time plan was for admission  to telemetry, check serial enzymes, rule out MI. He was considered for  neurologic consultation.   HOSPITAL COURSE:  On the morning of November 25, 2004, he was still having pain,  numbness, and weakness in his left arm. At that point we planned to obtain a  C-spine x-ray and get neurology consultation.  Later, on November 25, 2004, he was seen in neurology consultation, Dr. Avie Echevaria. He suspected a C5 radiculopathy. He planned to review x-ray films and  give steroids.   He is seen on the evening of November 25, 2004, in follow-up by Dr. Sandria Manly. The C-  spine showed degenerative disk disease at C5-6 and C6-7.  At that point in  time he planned to obtain an MRI, give steroids, and heat to the neck.   On the morning of November 26, 2004, he is seen in follow-up by Dr. Sandria Manly. The  pain was much improved. The MRI had shown DJD at C5-6 as well as C6-7 with  narrowing of the foramen. At that time he felt that the patient could be  discharged home with steroids and physical therapy exercises.   He was then seen by Dr. Tresa Endo later that morning. He was afebrile 98.0,  pulse 59, blood pressure 119/64, and saturating at 96% on room air. Labs are  all stable. Cardiac enzymes are negative. We have reviewed the findings of  the CT scan and MRI. At this point Dr. Nicki Guadalajara deems the patient stable  for discharge home.   HOSPITAL CONSULTATIONS:   Neurology consultation with Dr. Avie Echevaria on November 24, 2004. The patient was felt to have cervical radiculopathy.   HOSPITAL PROCEDURE:  None.   RADIOLOGY:  The patient underwent a C-spine which showed DJD in the cervical  spine at C5-6 and C6-7 without acute abnormality. The patient also had an  MRI, but the results are not in the computer, but have been reviewed by Dr.  Sandria Manly.   LABORATORY DATA:  Cardiac enzymes are all negative. INR 0.9. Electrolytes  show sodium of 139, potassium 3.9, BUN 10, creatinine 0.7. CBC within normal  limits. White count 10.5, hemoglobin 14.4, hematocrit 42.0, platelet count  276,000.   EKG shows normal sinus rhythm with nonspecific ST changes.   DISCHARGE MEDICATIONS:  1.  Prednisone taper 20 mg tablets. He is to take three a day for two days,      two a day for two days, and then one a day for five days. This is per      Dr. Sandria Manly.  2.  Lotrel 5/10 a day.  3.  Zocor 40 mg at night.  4.  Nexium 40 mg daily.  5.  Toprol XL 12.5 mg daily.  6.  Aspirin 81 mg daily.  7.  Nitroglycerin as directed.  8.  Viagra as directed.   ACTIVITY:  As directed by physical therapy.   FOLLOWUP:  With [name] on April 17th at 10:15 a.m. He can follow up  with Dr. Sandria Manly.      MBE/MEDQ  D:  11/26/2004  T:  11/26/2004  Job:  [id number]   cc:   Genene Churn. Love, [name]  [address]  [address]  [place]  [place] [address]  Fax: [phone]

## 2011-01-10 NOTE — Discharge Summary (Signed)
[name], [name]                           ACCOUNT NO.:  [id number]   MEDICAL RECORD NO.:  [id number]                   PATIENT TYPE:  INP   LOCATION:  6525                                 FACILITY:  [facility]   PHYSICIAN:  [name], [name]             DATE OF BIRTH:  17-Apr-1956   DATE OF ADMISSION:  06/10/2002  DATE OF DISCHARGE:  06/14/2002                                 DISCHARGE SUMMARY   DISCHARGE DIAGNOSES:  1. Unstable angina.  2. Coronary disease, coronary artery bypass grafting June 2003 with     postoperative cardiac arrest, ARDS, HIT.  3. Vasospastic angina.  4. Native obtuse marginal PCI this admission after saphenous vein graft     insertion.  5. Dyslipidemia.   HOSPITAL COURSE:  The patient was a 55 year old male with coronary disease  who had coronary artery bypass grafting in June of 2003 with a complicated  postoperative course.  He had a cardiac arrest postoperatively.  He had ARDS  requiring prolonged ventilation.  He had HIT.  He has not been on aspirin as  an outpatient.  Recently, he was seen in the office with recurrent chest  pain since he returned to work.  In the office he declined catheterization  and was set up for a Cardiolite study.  Cardiolite study showed significant  anterior lateral ischemia as well as inferior ischemia.  He is admitted for  further evaluation.  The patient was admitted to telemetry.  He was seen in  consult by pharmacy and put on Integrilin.  They did feel that he could be  rechallenged with aspirin and a rechallenge with heparin could be considered  since it had been more than three months since he had received heparin in  the past.  Catheterization was done June 10, 2002 by [name] which  revealed patent SVG to the RCA, patent SVG to the diagonal, patent LIMA to  the LAD.  The SVG limb to the OM was patent and in the native OM he had a  99% stenosis after the graft insertion.  Circumflex had 60% proximal  narrowing, RCA 80%, LAD 90%.  There was evidence of vasospasm treated with  intercoronary nitroglycerin with partial resolution of spasm.  He was  continued on IV Integrilin and set up for intervention of the native OM  after the SVG insertion on Monday.  Enzymes were negative for an MI.  EKG  showed no acute changes.  He was continued over the weekend on IV nitrates  and Integrilin.  Aspirin was resumed.  He underwent PCI to the OM June 13, 2002 with good results.  He does have significant amount of tenderness  at his right groin site with a small hematoma, but no significant ecchymosis  or bruit.  We feel he can be discharged later on the 21st.  He will follow  up with [name] as an  outpatient.   DISCHARGE MEDICATIONS:  1. Coated aspirin q.d. has been resumed.  2. Imdur 60 mg q.d.  3. Norvasc 5 mg q.d.  4. Altace 2.5 mg q.d.  5. Protonix 40 mg q.d.  6. Coreg 3.125 mg b.i.d. was added by [name].  7. Nitroglycerin sublingual p.r.n.   LABORATORIES:  White count 10.3, hemoglobin 13.2, hematocrit 38.6, platelets  234,000.  Sodium 140, potassium 3.8, BUN 5, creatinine 0.7.  Liver functions  are normal.  CK-MB and troponins are negative x2.  Chest x-ray shows no  active disease.  EKG feels sinus rhythm, T-waves in 3 and F, lateral T-wave  inversion.    DISPOSITION:  The patient is discharged in stable condition and will follow  up with [name] two to three weeks.     [name], [name]                      [name], [name]    [name]  D:  06/14/2002  T:  06/14/2002  Job:  [id number]   cc:   [name], [name]

## 2011-01-10 NOTE — Discharge Summary (Signed)
[place]  Patient:    [name], [name] Visit Number: [id number] MRN: [id number]          Service Type: MED Location: 2000 2001 01 Attending Physician:  Waldo Laine Dictated by:   Tollie Pizza Collins, [name]-C. Admit Date:  02/16/2002 Discharge Date: 03/06/2002   CC:         [facility]  Dr. Andres Ege, [place], [place]   Discharge Summary  PRIMARY ADMITTING DIAGNOSES: 1. Severe coronary artery disease. 2. Unstable angina.  ADDITIONAL DIAGNOSES: 1. Status post previous myocardial infarction/cardiac arrest secondary to    coronary vasospasm in October 2001. 2. Hyperlipidemia. 3. Perioperative cardiac arrest secondary to coronary spasm. 4. Postoperative ventilatory-dependent respiratory failure. 5. Postoperative anemia. 6. Heparin-induced thrombocytopenia. 7. History of tobacco and drug use.  PROCEDURES: 1. Cardiac catheterization. 2. Coronary artery bypass grafting x4 (left internal mammary artery to the    left anterior descending coronary artery, saphenous vein graft to the right    coronary artery, saphenous vein graft to the circumflex coronary, saphenous    vein graft to the diagonal).  HISTORY OF PRESENT ILLNESS:  The patient is a 55 year old white male who has a known history of coronary artery disease.  He is status post an out-of-hospital myocardial infarction and cardiac arrest in October 2001, which has been treated medically since that time.  His most recent cardiac catheterization was in May 2002, and at that time he was noted to have an on-the-table coronary spasm, which was treated with intracoronary nitroglycerin.  He has done well since that time until the day of this admission.  He developed 7/10 substernal chest pain with associated dizziness. He took nitroglycerin without relief.  Because of his previous history of cardiac arrest, he called EMS and was brought to [facility]  for further evaluation and treatment.  HOSPITAL COURSE:  He was admitted for evaluation.  On admission he was not noted to have EKG changes, and his cardiac enzymes were negative.  He did undergo cardiac catheterization on February 17, 2002.  He was noted to have severe coronary artery disease, which was not felt to be amenable to percutaneous intervention.  It was also noted in the catheterization lab that he had a significant right coronary artery spasm, which was treated with intracoronary nitroglycerin.  He was seen in consultation by Dr. Tyrone Sage, and it was felt that he should proceed with coronary artery bypass surgery at this time.  He was taken to the operating room on June 27, and lines were placed and he was intubated and placed under general anesthesia with no complications.  However, while the patient was being draped he was noted to have widening QRS complexes on the telemetry monitor and became significantly bradycardic and hypotensive. CPR was started, and the chest was opened.  Open cardiac massage was performed, and the patient developed ventricular fibrillation.  He was shocked x2 and was immediately placed on cardiopulmonary bypass.  He underwent the remainder of the procedure without further complications and was weaned off bypass without difficulty.  He was taken postoperatively to the SICU in stable condition and was maintained on multiple drips, including dopamine, lidocaine, and Cardizem.  He was hemodynamically stable and stable from a pulmonary status on postop day 1 and was extubated; however, later in the day of postop day 1 he developed increasing hypoxia and required reintubation.  He was maintained on the ventilator and was treated for hypotension.  Pulmonary critical care  medicine consultation was obtained, and he was placed on antibiotics for a possible pneumonia as well as treated with steroids and significant pulmonary toilet measures.  At this time he was  also noted to have an anemia, which was treated with a blood transfusion.  He was started on TNA and as his ventilator course became more prolonged was switched to tube feedings via a Panda tube.  He continued to have pulmonary difficulty and was unable to be extubated.  He underwent a 2 D echocardiogram and a bubble study, both of which showed no evidence of acute abnormalities, including a coronary shunt.  During this time period he aspirated and his white blood cell count became increasingly elevated.  He was continued on IV antibiotics.  Gradually he began to improve from a pulmonary standpoint and ultimately was able to be weaned from the ventilator and extubated on postop day 8.  His respiratory cultures were all negative; therefore, his antibiotics were discontinued.  He was weaned off IV steroids and switched to p.o.  By postop day 12 he was stable enough for transfer to the step-down unit.  In the immediate postoperative period he was also noted to have a significant thrombocytopenia, and a heparin-induced thrombocytopenia panel was performed, which did reveal evidence of heparin-induced thrombocytopenia.  Once he had stabilized, all heparin was discontinued and since that time, his platelets have improved to normal.  On postop day 13 he was transferred to the floor.  His tube feedings were stopped once he stabilized, and he was started on a regular diet.  He has been anticoagulated with Coumadin, and his INR presently is 1.8.  He has been doing well since his transfer to the floor.  He has remained afebrile, and all vital signs have been stable.  He is maintaining O2 saturations of between 93 and 98% on room air.  He has been ambulating in the halls without difficulty. He is tolerating a regular diet and is having normal bowel and bladder function.  His wounds are healing well.  His most recent CBC showed a white blood cell count of 18.4 with a hemoglobin of 9.7, hematocrit 28.6,  and  platelets 346.  It is felt that as he has remained stable and is doing well, he would be ready for discharge home at this time.  DISCHARGE MEDICATIONS: 1. Zocor 20 mg q.h.s. 2. Altace 2.5 mg q.d. 3. Protonix 40 mg q.d. 4. Norvasc 5 mg q.d. 5. Imdur 30 mg q.d. 6. Coumadin 5 mg q.d. 7. Prednisone 5 mg q.d. x2 more days, then this may be discontinued. 8. Tylox one to two q.4h. p.r.n. for pain.  DISCHARGE INSTRUCTIONS:  He is to refrain from driving, heavy lifting, or strenuous activity.  He may continue a low-fat, low-sodium diet.  He may shower daily and clean his incisions with soap and water.  DISCHARGE FOLLOW-UP:  He is asked to have a PT and INR drawn at Dr. Loraine Grip office on Wednesday, July 16, for further Coumadin dosing.  He is also asked to make an appointment to see [name] in the next two weeks for follow-up. The CVTS office will call to schedule an appointment with Dr. Donata Clay in three weeks.  He should have a chest x-ray at [facility] one hour prior to this appointment and bring his chest x-ray for Dr. Ysidro Evert review.  He is asked to call our office if he experiences any problems or has questions in the interim. Dictated by:  Gina H. Collins, [name]-C. Attending Physician:  Waldo Laine DD:  03/06/02 TD:  03/08/02 Job: [id number]

## 2011-01-24 ENCOUNTER — Ambulatory Visit: Payer: Self-pay | Admitting: Internal Medicine

## 2011-02-07 ENCOUNTER — Other Ambulatory Visit: Payer: Self-pay | Admitting: Cardiovascular Disease

## 2011-02-23 ENCOUNTER — Ambulatory Visit: Payer: Self-pay | Admitting: Internal Medicine

## 2011-03-10 ENCOUNTER — Encounter: Payer: Self-pay | Admitting: Cardiovascular Disease

## 2011-03-12 ENCOUNTER — Emergency Department (HOSPITAL_COMMUNITY): Payer: 59

## 2011-03-12 ENCOUNTER — Inpatient Hospital Stay (HOSPITAL_COMMUNITY)
Admission: EM | Admit: 2011-03-12 | Discharge: 2011-03-14 | DRG: 149 | Disposition: A | Discharge status: Home / Self Care | Payer: 59 | Attending: Family Medicine | Admitting: Family Medicine

## 2011-03-12 DIAGNOSIS — Z7982 Long term (current) use of aspirin: Secondary | ICD-10-CM

## 2011-03-12 DIAGNOSIS — R079 Chest pain, unspecified: Secondary | ICD-10-CM

## 2011-03-12 DIAGNOSIS — I10 Essential (primary) hypertension: Secondary | ICD-10-CM | POA: Diagnosis present

## 2011-03-12 DIAGNOSIS — E785 Hyperlipidemia, unspecified: Secondary | ICD-10-CM | POA: Diagnosis present

## 2011-03-12 DIAGNOSIS — I252 Old myocardial infarction: Secondary | ICD-10-CM

## 2011-03-12 DIAGNOSIS — H8110 Benign paroxysmal vertigo, unspecified ear: Principal | ICD-10-CM | POA: Diagnosis present

## 2011-03-12 DIAGNOSIS — I2510 Atherosclerotic heart disease of native coronary artery without angina pectoris: Secondary | ICD-10-CM | POA: Diagnosis present

## 2011-03-12 DIAGNOSIS — R42 Dizziness and giddiness: Secondary | ICD-10-CM

## 2011-03-12 DIAGNOSIS — Z8249 Family history of ischemic heart disease and other diseases of the circulatory system: Secondary | ICD-10-CM

## 2011-03-12 DIAGNOSIS — Z951 Presence of aortocoronary bypass graft: Secondary | ICD-10-CM

## 2011-03-12 DIAGNOSIS — D72829 Elevated white blood cell count, unspecified: Secondary | ICD-10-CM | POA: Diagnosis present

## 2011-03-12 LAB — GLUCOSE, CAPILLARY: Glucose-Capillary: 122 mg/dL — ABNORMAL HIGH (ref 70–99)

## 2011-03-12 LAB — URINALYSIS, ROUTINE W REFLEX MICROSCOPIC
Bilirubin Urine: NEGATIVE
Glucose, UA: NEGATIVE mg/dL
Hgb urine dipstick: NEGATIVE
Ketones, ur: NEGATIVE mg/dL
Leukocytes, UA: NEGATIVE
Nitrite: NEGATIVE
Protein, ur: NEGATIVE mg/dL
Specific Gravity, Urine: 1.02 (ref 1.005–1.030)
Urobilinogen, UA: 1 mg/dL (ref 0.0–1.0)
pH: 5.5 (ref 5.0–8.0)

## 2011-03-12 LAB — TROPONIN I: Troponin I: <0.3 ng/mL (ref <0.30)

## 2011-03-12 LAB — DIFFERENTIAL
Basophils Absolute: 0 10*3/uL (ref 0.0–0.1)
Basophils Relative: 0 % (ref 0–1)
Eosinophils Absolute: 0.8 10*3/uL — ABNORMAL HIGH (ref 0.0–0.7)
Eosinophils Relative: 5 % (ref 0–5)
Lymphocytes Relative: 14 % (ref 12–46)
Lymphs Abs: 2 10*3/uL (ref 0.7–4.0)
Monocytes Absolute: 1 10*3/uL (ref 0.1–1.0)
Monocytes Relative: 7 % (ref 3–12)
Neutro Abs: 10.9 10*3/uL — ABNORMAL HIGH (ref 1.7–7.7)
Neutrophils Relative %: 74 % (ref 43–77)

## 2011-03-12 LAB — BASIC METABOLIC PANEL
BUN: 15 mg/dL (ref 6–23)
CO2: 25 mEq/L (ref 19–32)
Calcium: 9 mg/dL (ref 8.4–10.5)
Chloride: 104 mEq/L (ref 96–112)
Creatinine, Ser: 0.65 mg/dL (ref 0.50–1.35)
GFR calc Af Amer: >60 mL/min (ref >60)
GFR calc non Af Amer: >60 mL/min (ref >60)
Glucose, Bld: 162 mg/dL — ABNORMAL HIGH (ref 70–99)
Potassium: 3.5 mEq/L (ref 3.5–5.1)
Sodium: 139 mEq/L (ref 135–145)

## 2011-03-12 LAB — PRO B NATRIURETIC PEPTIDE: Pro B Natriuretic peptide (BNP): 81 pg/mL (ref 0–125)

## 2011-03-12 LAB — CBC
HCT: 37.9 % — ABNORMAL LOW (ref 39.0–52.0)
Hemoglobin: 14 g/dL (ref 13.0–17.0)
MCH: 32 pg (ref 26.0–34.0)
MCHC: 36.9 g/dL — ABNORMAL HIGH (ref 30.0–36.0)
MCV: 86.5 fL (ref 78.0–100.0)
Platelets: 236 10*3/uL (ref 150–400)
RBC: 4.38 MIL/uL (ref 4.22–5.81)
RDW: 12.1 % (ref 11.5–15.5)
WBC: 14.7 10*3/uL — ABNORMAL HIGH (ref 4.0–10.5)

## 2011-03-12 LAB — CK TOTAL AND CKMB (NOT AT ARMC)
CK, MB: 1.6 ng/mL (ref 0.3–4.0)
Relative Index: INVALID (ref 0.0–2.5)
Total CK: 85 U/L (ref 7–232)

## 2011-03-12 LAB — PROTIME-INR
INR: 0.99 (ref 0.00–1.49)
Prothrombin Time: 13.3 seconds (ref 11.6–15.2)

## 2011-03-12 MED ORDER — GADOBENATE DIMEGLUMINE 529 MG/ML IV SOLN
18.0000 mL | Freq: Once | INTRAVENOUS | Status: AC | PRN
  Administered 2011-03-12: 18 mL via INTRAVENOUS

## 2011-03-13 ENCOUNTER — Inpatient Hospital Stay (HOSPITAL_COMMUNITY): Payer: 59

## 2011-03-13 DIAGNOSIS — R072 Precordial pain: Secondary | ICD-10-CM

## 2011-03-13 LAB — BASIC METABOLIC PANEL
BUN: 11 mg/dL (ref 6–23)
CO2: 27 mEq/L (ref 19–32)
Calcium: 8.2 mg/dL — ABNORMAL LOW (ref 8.4–10.5)
Chloride: 104 mEq/L (ref 96–112)
Creatinine, Ser: 0.6 mg/dL (ref 0.50–1.35)
GFR calc Af Amer: >60 mL/min (ref >60)
GFR calc non Af Amer: >60 mL/min (ref >60)
Glucose, Bld: 91 mg/dL (ref 70–99)
Potassium: 3.7 mEq/L (ref 3.5–5.1)
Sodium: 138 mEq/L (ref 135–145)

## 2011-03-13 LAB — CARDIAC PANEL(CRET KIN+CKTOT+MB+TROPI)
CK, MB: 1.9 ng/mL (ref 0.3–4.0)
CK, MB: 2.1 ng/mL (ref 0.3–4.0)
Relative Index: INVALID (ref 0.0–2.5)
Relative Index: INVALID (ref 0.0–2.5)
Total CK: 61 U/L (ref 7–232)
Total CK: 82 U/L (ref 7–232)
Troponin I: <0.3 ng/mL (ref <0.30)
Troponin I: <0.3 ng/mL (ref <0.30)

## 2011-03-13 LAB — CK TOTAL AND CKMB (NOT AT ARMC)
CK, MB: 1.9 ng/mL (ref 0.3–4.0)
Relative Index: INVALID (ref 0.0–2.5)
Total CK: 72 U/L (ref 7–232)

## 2011-03-13 LAB — CBC
HCT: 35.7 % — ABNORMAL LOW (ref 39.0–52.0)
Hemoglobin: 12.7 g/dL — ABNORMAL LOW (ref 13.0–17.0)
MCH: 30.8 pg (ref 26.0–34.0)
MCHC: 35.6 g/dL (ref 30.0–36.0)
MCV: 86.7 fL (ref 78.0–100.0)
Platelets: 223 10*3/uL (ref 150–400)
RBC: 4.12 MIL/uL — ABNORMAL LOW (ref 4.22–5.81)
RDW: 12.2 % (ref 11.5–15.5)
WBC: 16.3 10*3/uL — ABNORMAL HIGH (ref 4.0–10.5)

## 2011-03-13 LAB — TROPONIN I: Troponin I: <0.3 ng/mL (ref <0.30)

## 2011-03-13 LAB — TSH: TSH: 1.507 u[IU]/mL (ref 0.350–4.500)

## 2011-03-14 ENCOUNTER — Other Ambulatory Visit: Payer: Self-pay | Admitting: *Deleted

## 2011-03-14 LAB — CBC
HCT: 34.1 % — ABNORMAL LOW (ref 39.0–52.0)
Hemoglobin: 12.1 g/dL — ABNORMAL LOW (ref 13.0–17.0)
MCH: 31.4 pg (ref 26.0–34.0)
MCHC: 35.5 g/dL (ref 30.0–36.0)
MCV: 88.6 fL (ref 78.0–100.0)
Platelets: 207 10*3/uL (ref 150–400)
RBC: 3.85 MIL/uL — ABNORMAL LOW (ref 4.22–5.81)
RDW: 12.5 % (ref 11.5–15.5)
WBC: 10.8 10*3/uL — ABNORMAL HIGH (ref 4.0–10.5)

## 2011-03-14 NOTE — Consult Note (Signed)
  NAMEBRNADON, EOFF                 ACCOUNT NO.:  [id number]  MEDICAL RECORD NO.:  [id number]  LOCATION:  2032                         FACILITY:  [facility]  PHYSICIAN:  Cordie Buening H. Pollyann Kennedy, MD     DATE OF BIRTH:  1956/07/12  DATE OF CONSULTATION:  03/13/2011 DATE OF DISCHARGE:                                CONSULTATION   REASON FOR CONSULTATION:  Vertigo.  HISTORY:  This is a 54 year old gentleman with a long history of cardiac disease, multiple catheterizations.  He was admitted to the hospital yesterday when he had sudden onset of lightheadedness, dizziness, vertigo, chest pain, and shortness of breath while working on his tractor.  He was brought to the emergency room, admitted to the hospital, underwent cardiac workup which was all negative.  He then underwent MRI and MRA of brain and there was no evidence of any vascular pathology or neoplasm.  He denies any hearing loss and he denies any tinnitus.  He has never had problems like this before.  He underwent Piedad Climes- Hallpike testing earlier today which was positive.  Then, he underwent Epley repositioning maneuvers.  Right now, he feels fine as long as he is staring straight or when he lies down if he turns over to the left. However, if he returns to the right while lying down, he gets severely vertiginous.  PAST MEDICAL HISTORY:  Coronary artery disease, hypertension, status post neck surgery x2.  He is a former smoker.  PHYSICAL EXAMINATION:  GENERAL:  Healthy-appearing gentleman.  He is in no distress. HEENT:  Oral cavity and pharynx are clear.  He is edentulous in the maxilla and has dentures.  Nasal exam unremarkable.  Ear canals are clear, tympanic membranes and middle ears normal to inspection.  Eyes, there is no spontaneous nystagmus.  When he turns his gaze to the right side, there is a few beats of fast phase to the right nystagmus.  I had him lie on his back and turned his head to the right side and he gets acutely  vertiginous and has a few beats of a rotatory, counterclockwise nystagmus.  There is no similar reaction to the right side. NECK:  No palpable neck masses.  IMPRESSION:  Consistent with benign positional paroxysmal vertigo. Recommended continue working with the therapist on the repositioning maneuvers.  I stressed come back tomorrow and reevaluate him and we treat him if needed.  Contact me for additional followup if anything new arises.  We can certainly see him as an outpatient if he has any recurrence in the future.     Lyrique Hakim H. Pollyann Kennedy, MD     JHR/MEDQ  D:  03/13/2011  T:  03/14/2011  Job:  [id number]  Electronically Signed by Serena Colonel MD on 03/14/2011 07:54:52 AM

## 2011-03-14 NOTE — Consult Note (Signed)
  [name], Robert                 ACCOUNT NO.:  [id number]  MEDICAL RECORD NO.:  [id number]  LOCATION:  2032                         FACILITY:  [facility]  PHYSICIAN:  Hatem Cull H. Pollyann Kennedy, MD     DATE OF BIRTH:  May 29, 1956  DATE OF CONSULTATION: DATE OF DISCHARGE:                                CONSULTATION   ADDENDUM: He is currently on meclizine and I would recommend that they hold off on the meclizine and once he ask it will be needed.  I would make it p.r.n. This could potentially interfere with the recovery from this condition.     Lavanna Rog H. Pollyann Kennedy, MD     JHR/MEDQ  D:  03/13/2011  T:  03/14/2011  Job:  [id number]  Electronically Signed by Serena Colonel MD on 03/14/2011 07:54:49 AM

## 2011-03-15 NOTE — Consult Note (Signed)
[name], [name] NO.:  [id number]  MEDICAL RECORD NO.:  [id number]  LOCATION:  2032                         FACILITY:  [facility]  PHYSICIAN:  Thana Farr, MD    DATE OF BIRTH:  29-Jun-1956  DATE OF CONSULTATION:  03/13/2011 DATE OF DISCHARGE:                                CONSULTATION   REASON FOR CONSULTATION:  Dizziness.  HISTORY OF PRESENT ILLNESS:  This is a pleasant 55 year old male with a past medical history of CAD, CABG, hypertension, hernia repair, cervical spine surgery x2, and hyperlipidemia.  The patient states that on the date of March 12, 2011, he was at work when he noted a period of dizziness, diaphoresis, and nausea.  The patient stopped the equipment he was working on and called for his coworkers.  At that time, the patient was given 1 nitro tablet but this did not seem to help.  EMTs were seen at that time and brought him to [facility].  At [facility], the patient was given multiple nitro pills for his chest discomfort, however, after even multiple nitros, the patient became severely diaphoretic with nausea, vomiting, and his blood pressure dropped.  Nitro was stopped at that time after blood pressure was evaluated and when what felt to be a safe blood pressure nitro was restarted.  At that time, again the patient continued to complain of dizziness which was more acutely described as a "wooziness of the head." During hospitalization, the patient has had multiple episodes of this wooziness in the head which was accompanied by nausea and diaphoresis and blurred vision.  On the morning of March 13, 2011, the patient complained of the same symptoms, however, this time he states that the room was spinning from left to right.  It was for this reason that Neurology was consulted for further evaluation of possible vertigo.  At the time of consultation, the patient had already obtained a MRI of his brain which showed no acute  intracranial stroke, however, he did have mild sinusitis.  The patient's MRA of the neck showed no significant intracranial atherosclerotic changes.  Unfortunately, no orthostatic blood pressures had been taken on the patient during his hospital stay.  PAST MEDICAL HISTORY: 1. Cervical spine surgery x2. 2. CABG. 3. Hernia repair. 4. Hypertension. 5. Hyperlipidemia.  MEDICATIONS:  While in the hospital, the patient has been placed on Norvasc, aspirin, Cozaar, meclizine, Toprol, Crestor, acetaminophen, Zofran, and Ambien.  ALLERGIES: 1. PENICILLIN. 2. HEPARIN. 3. CODEINE.  FAMILY HISTORY:  CAD.  SOCIAL HISTORY:  The patient lives with his wife.  He does not smoke or drink.  He is an heavy Arboriculturist.  Does not do drugs.  REVIEW OF SYSTEMS:  Positive for dizziness, blurred vision, weakness, nausea, vomiting, or diaphoresis.  PHYSICAL EXAMINATION:  VITAL SIGNS:  Temperature is 98 degrees, blood pressure is 121/69, heart rate 75, and respirations 20. LUNGS:  Clear to auscultation bilaterally. NECK:  Negative for bruits.CARDIOVASCULAR:  S1-S2 is audible. ABDOMEN:  Soft, nontender, and nondistended. SKIN:  Warm, dry, and intact. NEUROLOGIC:  Pupils are equal, round, and react to light and accommodating.  Extraocular movements are intact.  Tongue is  midline. The patient does show far gaze nystagmus to both the right and the left. However, in addition, he does show nystagmus when he looks to the right not only at far gaze but after he pulls away from far gaze slightly. This may represent some auditory pathology.  The patient's speech was clear, well-enunciated, and he showed no aphasia.  The patient was moving all his extremities 5/5 with no muscle clonus, asterixis, or abnormal muscle movements.  Finger-to-nose and heel-to-shin were smooth. Deep tendon reflexes were 2+ throughout.  He had downgoing toes bilaterally.  The patient's sensation was grossly intact.  Upon  standing the patient, the patient was able to stand on his own accord although albeit slowly.  Upon standing, he stated the room felt as though it was moving from inferior to superior and had to sit down immediately.  LABORATORY DATA:  Sodium 138, potassium 3.7, chloride 104, CO2 27, BUN 4, creatinine 0.60, and glucose is 91.  White blood cell count 16.3, hemoglobin is 12.7, hematocrit 35.7, and platelets 223.  TSH is pending. CK 72, CK-MB 1.9, and troponin is 0.3.  Urinalysis negative.  Chest x- ray showed no evidence of acute cardiopulmonary disease.  No pleural effusion.  Lungs are clear.  As stated above, MRI of head showed no intracranial abnormalities.  MRA of head was negative.  MRA of neck was negative.  ASSESSMENT:  This is a 55 year old Caucasian male with periods of "wooziness," "lightheadedness," "tunnel vision," "nausea and vomiting" with 1-2 episodes of vertiginous sensation in which the room was moving from left to the right.  The patient's MRI of head, MRA of head, and MRA of neck were negative.  The patient shows a nonfocal neurological exam with the exception of nystagmus on looking to the right. The differential diagnosis includes inner ear pathology (BPPV, etc.) versus possible cardiogenic orthostasis.  RECOMMENDATIONS: 1. Obtain ENT consult. 2. Continue meclizine. 3. Have Physical Therapy evaluate and administered the Dix-Hallpike     maneuver. 4. Obtain orthostatic blood pressures.   Dr. Thad Ranger has seen and evaluated the patient and she agrees with the above-mentioned.  Thank you very much for this consultation.     Felicie Morn, PA-C   ______________________________ Thana Farr, MD    DS/MEDQ  D:  03/13/2011  T:  03/13/2011  Job:  [id number]  Electronically Signed by Felicie Morn PA-C on 03/14/2011 02:40:22 PM Electronically Signed by Thana Farr MD on 03/15/2011 10:38:32 AM

## 2011-03-18 ENCOUNTER — Encounter: Payer: 59 | Admitting: Cardiovascular Disease

## 2011-03-18 ENCOUNTER — Other Ambulatory Visit: Payer: 59 | Admitting: *Deleted

## 2011-03-20 ENCOUNTER — Ambulatory Visit: Payer: 59 | Attending: Physician Assistant | Admitting: Physical Therapy

## 2011-03-20 DIAGNOSIS — R279 Unspecified lack of coordination: Secondary | ICD-10-CM | POA: Insufficient documentation

## 2011-03-20 DIAGNOSIS — H539 Unspecified visual disturbance: Secondary | ICD-10-CM | POA: Insufficient documentation

## 2011-03-20 DIAGNOSIS — R42 Dizziness and giddiness: Secondary | ICD-10-CM | POA: Insufficient documentation

## 2011-03-20 DIAGNOSIS — IMO0001 Reserved for inherently not codable concepts without codable children: Secondary | ICD-10-CM | POA: Insufficient documentation

## 2011-03-21 ENCOUNTER — Ambulatory Visit (INDEPENDENT_AMBULATORY_CARE_PROVIDER_SITE_OTHER): Payer: 59 | Admitting: Cardiovascular Disease

## 2011-03-21 ENCOUNTER — Encounter: Payer: Self-pay | Admitting: Cardiovascular Disease

## 2011-03-21 ENCOUNTER — Ambulatory Visit (INDEPENDENT_AMBULATORY_CARE_PROVIDER_SITE_OTHER): Payer: 59 | Admitting: *Deleted

## 2011-03-21 VITALS — BP 122/82 | HR 61 | Ht 65.0 in | Wt 197.0 lb

## 2011-03-21 DIAGNOSIS — I2510 Atherosclerotic heart disease of native coronary artery without angina pectoris: Secondary | ICD-10-CM

## 2011-03-21 DIAGNOSIS — R42 Dizziness and giddiness: Secondary | ICD-10-CM

## 2011-03-21 DIAGNOSIS — I25810 Atherosclerosis of coronary artery bypass graft(s) without angina pectoris: Secondary | ICD-10-CM

## 2011-03-21 DIAGNOSIS — E785 Hyperlipidemia, unspecified: Secondary | ICD-10-CM

## 2011-03-21 DIAGNOSIS — I10 Essential (primary) hypertension: Secondary | ICD-10-CM

## 2011-03-21 LAB — LIPID PANEL
Cholesterol: 115 mg/dL (ref 0–200)
HDL: 45 mg/dL (ref >39)
LDL Cholesterol: 49 mg/dL (ref 0–99)
Total CHOL/HDL Ratio: 2.6 Ratio
Triglycerides: 107 mg/dL (ref <150)
VLDL: 21 mg/dL (ref 0–40)

## 2011-03-21 LAB — HEPATIC FUNCTION PANEL
ALT: 29 U/L (ref 0–53)
AST: 20 U/L (ref 0–37)
Albumin: 4.6 g/dL (ref 3.5–5.2)
Alkaline Phosphatase: 102 U/L (ref 39–117)
Bilirubin, Direct: 0.2 mg/dL (ref 0.0–0.3)
Indirect Bilirubin: 0.4 mg/dL (ref 0.0–0.9)
Total Bilirubin: 0.6 mg/dL (ref 0.3–1.2)
Total Protein: 7.1 g/dL (ref 6.0–8.3)

## 2011-03-21 MED ORDER — ASPIRIN 81 MG PO TBEC: 81.0000 mg | DELAYED_RELEASE_TABLET | Freq: Two times a day (BID) | ORAL | Status: DC

## 2011-03-21 NOTE — Assessment & Plan Note (Signed)
Cholesterol is at goal on the current lipid regimen. No changes to the medications were made.  

## 2011-03-21 NOTE — Patient Instructions (Signed)
You are doing well. Please increase the aspirin to 81 mg x 2 Stop the effient as you have been doing Please call us if you have new issues that need to be addressed before your next appt.  We will call you for a follow up Appt. In 6 months

## 2011-03-21 NOTE — Assessment & Plan Note (Signed)
Currently with no symptoms of angina. No further workup at this time. Continue current medication regimen. 

## 2011-03-21 NOTE — Progress Notes (Signed)
Patient ID: [name], male    DOB: 06-May-1956, 55 y.o.   MRN: [id number]  HPI Comments: 55 year old gentleman with a history of three vessel CAD, bypass surgery, hyperlipidemia and hypertension, smoking history for 23 years who stopped in 2003,  who presents for routine follow up.   previously, Mr. Kiel has had short episodes of chest discomfort described as a tingling pain sometimes with flushing and shortness of breath. These lasted several minutes. These have been chronic. Recently he has been feeling well and does not report having significant symptoms. He has been active with no shortness of breath with exertion.    He has had a recent hospital admission for profound vertigo. He was admitted to the hospital on July 18, discharged on the 20th. 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 Recommendation was made to discontinue his effient and he was discharged on aspirin alone. He believes he is back to 80% of his baseline but still mild residual symptoms of vertigo.  Cardiac Cath Procedure date:  07/01/2007  1. Significant three-vessel CAD.  Patent LIMA to LAD with no severe significant disease in the LAD        beyond IMA insertion.  Patent vein graft to the diagonal with no disease beyond the        diagonal graft insertion.  Patent vein graft to the obtuse marginal, with no disease beyond        the graft insertion.  Patent vein graft to the right coronary artery with mild 50%        percent narrowing of the right coronary artery beyond the graft        insertion.  Mildly depressed left ventricular systolic function.  Wall motion        abnormalities noted above.    EKG shows normal sinus rhythm with rate 61 beats per minute left axis deviation, poor R-wave progression through the precordial leads.   Outpatient Encounter Prescriptions as of 03/21/2011  Medication Sig Dispense Refill  . amLODipine (NORVASC) 10 MG tablet Take 10 mg by mouth daily. 1/2 - 1  tab       . atorvastatin (LIPITOR) 40 MG tablet Take 1/2 tablet daily.      [name] losartan-hydrochlorothiazide (HYZAAR) 100-25 MG per tablet TAKE 1 TABLET BY MOUTH EVERY DAY  30 tablet  6  . meclizine (ANTIVERT) 50 MG tablet Take 50 mg by mouth as needed.        . metoprolol succinate (TOPROL XL) 25 MG 24 hr tablet Take 12.5 mg by mouth daily.        . nitroGLYCERIN (NITROSTAT) 0.4 MG SL tablet Place 0.4 mg under the tongue every 5 (five) minutes as needed.        . sildenafil (VIAGRA) 100 MG tablet Take 50 mg by mouth daily as needed.        [name]  aspirin (ASPIR-81) 81 MG EC tablet Take 81 mg by mouth daily.        [name] DISCONTD: prasugrel (EFFIENT) 10 MG TABS Take by mouth daily.           Review of Systems  Constitutional: Negative.   HENT: Negative.   Eyes: Negative.   Respiratory: Negative.   Cardiovascular: Negative.   Gastrointestinal: Negative.   Musculoskeletal: Negative.   Skin: Negative.   Neurological: Positive for dizziness.  Hematological: Negative.   Psychiatric/Behavioral: Negative.   All other systems reviewed and are negative.    BP 122/82  Pulse  61  Ht 5\' 5"  (1.651 m)  Wt 197 lb (89.359 kg)  BMI 32.78 kg/m2  Physical Exam  Nursing note and vitals reviewed. Constitutional: He is oriented to person, place, and time. He appears well-developed and well-nourished.  HENT:  Head: Normocephalic.  Nose: Nose normal.  Mouth/Throat: Oropharynx is clear and moist.  Eyes: Conjunctivae are normal. Pupils are equal, round, and reactive to light.  Neck: Normal range of motion. Neck supple. No JVD present.  Cardiovascular: Normal rate, regular rhythm, S1 normal, S2 normal, normal heart sounds and intact distal pulses.  Exam reveals no gallop and no friction rub.   No murmur heard. Pulmonary/Chest: Effort normal and breath sounds normal. No respiratory distress. He has no wheezes. He has no rales. He exhibits no tenderness.  Abdominal: Soft. Bowel sounds are normal. He exhibits  no distension. There is no tenderness.  Musculoskeletal: Normal range of motion. He exhibits no edema and no tenderness.  Lymphadenopathy:    He has no cervical adenopathy.  Neurological: He is alert and oriented to person, place, and time. Coordination normal.  Skin: Skin is warm and dry. No rash noted. No erythema.  Psychiatric: He has a normal mood and affect. His behavior is normal. Judgment and thought content normal.           Assessment and Plan

## 2011-03-21 NOTE — Assessment & Plan Note (Signed)
Blood pressure is well controlled on today's visit. No changes made to the medications. 

## 2011-03-21 NOTE — Assessment & Plan Note (Signed)
He continues to recover From his vertigo. Still not back to baseline. He is in rehabilitation.

## 2011-03-24 NOTE — Discharge Summary (Signed)
NAMEHAYDIN, CALANDRA NO.:  [id number]  MEDICAL RECORD NO.:  [id number]  LOCATION:  2032                         FACILITY:  [facility]  PHYSICIAN:  Nestor Ramp, MD        DATE OF BIRTH:  07-31-1956  DATE OF ADMISSION:  03/12/2011 DATE OF DISCHARGE:  03/14/2011                              DISCHARGE SUMMARY   PRIMARY CARE PHYSICIAN:  Dr. Anna Genre at [facility].  CONSULTS: 1. Jefry H. Pollyann Kennedy, MD 2. Arturo Morton. Riley Kill, MD, [facility], with Cardiology. 3. Dr. Thad Ranger with Neurology.  REASON FOR ADMISSION:  Vertigo.  DISCHARGE DIAGNOSES: 1. Benign paroxysmal positional vertigo. 2. Coronary artery disease, status post myocardial infarction,     coronary artery bypass grafting, and stent 3. Resolved leukocytosis. 4. Hypertension.  DISCHARGE MEDICATIONS:  New medications:  Meclizine 25 mg 2 tablets every 6 hours as needed.  Continued medications: 1. Amlodipine 10 mg 1/2 tablet daily. 2. Aspirin 81 mg daily. 3. Lipitor 40 mg 1/2 tablet daily. 4. Losartan/hydrochlorothiazide 100/25 mg daily. 5. Metoprolol XL 25 mg 1/2 tablet daily. 6. Sublingual nitroglycerin 0.4 mg as needed. 7. Viagra 100 mg 1/2 tablet daily as needed.  Stop the following medications:  Effient 10 mg 1/2 tablet daily.  HOSPITAL COURSE:  [name] is a 55 year old man with coronary artery disease status post MI, CABG, and stent and hypertension, presenting to the ED with a history of acute onset of dizziness, nausea, and chest pain.  He was initially thought to have unstable angina and was started on a nitro drip.  He had some seizure-like activity and continued to have episodes of dizziness, found to be vertigo with nausea and vomiting.  Upon further history, his initial presentating complaint was primarily vertigo rather than chest pain. 1. Benign paroxysmal positional vertigo.  After consulting Neurology     and ENT, the patient was diagnosed with BPPV.  Neurology did not think this was TIA or  stroke. They recommended ENT referral. ENT diagnosed as BPPV.The patient had     multiple episodes of vertigo with nausea and vomiting.  A Dix-     Hallpike maneuver was positive with relief provided by Epley     maneuvers.  Meclizine also relieved his symptoms.  He was provided     teaching of Epley maneuver by ENT and Physical Therapy as well as a     prescription for meclizine.  Per ENT, he is to take the meclizine     only if needed. He also had some seizure like activity on one occasion and neurology felt this was not a true seizure, but a secondary phenomenon and required no further work up. 2. Coronary artery disease.  He received a full cardiac workup for     chest pain given his history.  Cardiac enzymes were negative x3 and     an echocardiogram was normal.  Cardiology continued his home     medications with the exception of Effient.  Cardiology recommended discontinue this. 3. Leukocytosis.  He had a white count on admission which peaked at     16.3 and was back down to 10.5 on discharge.  Given the time  course, the leukocytosis was thought to be secondary to nausea and     vomiting and the seizure like activity mentioned above. 4. Hypertension.  He was continued on his home medications except for     hydrochlorothiazide, which was restarted on discharge.  He was normotensive throughout the     hospitalization and was discharged on his home medicines.  DISCHARGE INSTRUCTIONS:  He was instructed to stay hydrated and avoid caffeine as the meclizine is an anticholinergic.  He was also instructed not to return to work until he is seen his primary care provider.  A work note was provided on discharge.  CONDITION ON DISCHARGE:  Stable.  PENDING TEST:  None.  FOLLOWUP: 1. The patient is to make an appointment with his primary care     provider next week. 2. The patient is to make appointment with cardiologist next week. 3. The patient is to follow up with Dr. Pollyann Kennedy in ENT if  his symptoms     recur.  FOLLOWUP ISSUES: 1. Wean back on the meclizine if symptoms are well     controlled. 2. Please address returning to work and provide any paperwork needed     if he is unable to return to work. 3. Please make sure his primary cardiologist is okay with stopping the     Effient and restart that if he would like it continued.    ______________________________ Despina Hick, MD   ______________________________ Nestor Ramp, MD    EB/MEDQ  D:  03/14/2011  T:  03/15/2011  Job:  [id number]  cc:   Dr. Cathlean Marseilles H. Pollyann Kennedy, MD Arturo Morton. Riley Kill, MD, [facility] Dr. Thad Ranger  Electronically Signed by Despina Hick MD on 03/15/2011 03:29:16 PM Electronically Signed by Denny Levy MD on 03/24/2011 11:13:03 AM

## 2011-03-24 NOTE — H&P (Signed)
NAMEOSAMAH, SCHMADER NO.:  [id number]  MEDICAL RECORD NO.:  [id number]  LOCATION:  2032                         FACILITY:  [facility]  PHYSICIAN:  Nestor Ramp, MD        DATE OF BIRTH:  1956-07-28  DATE OF ADMISSION:  03/12/2011 DATE OF DISCHARGE:                             HISTORY & PHYSICAL   PRIMARY CARE PHYSICIAN:  This is an unassigned patient.  PRIMARY CARE DOCTOR:  Dr. Anna Genre, at [facility].  CHIEF COMPLAINT:  Chest pain.  HISTORY OF PRESENT ILLNESS:  Ms. Baeten is a 55 year old man with a history of coronary artery disease and MI and status post CABG, hypertension, hyperlipidemia, presenting to the emergency room with chest pain, shortness of breath, dizziness, and nausea that improved with sublingual nitroglycerin.  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  He was brought to the ED, by ambulance and Cardiology called it unstable angina and started a nitro drip.  He had one episode of unresponsiveness that resolved with stopping the nitro drip.  Once the nitro drip was restarted, he experienced dizziness, staring spell, and jerking of the upper extremities.  This again resolved with cessation of the nitro drip.  Cardiology felt he needed admission with a more General Service and we were called.  He continues to experience some dizziness and vertigo and some chest pain.  A dix-hallpike was negative.  Currently, he is denying shortness of breath, nausea, and diaphoresis.  PAST MEDICAL HISTORY:  Hypertension and hyperlipidemia.  PAST SURGICAL HISTORY:  He has had 2 cervical spine surgeries, a hernia repair in the 1990s, and a CABG.  SOCIAL HISTORY:  He lives with his wife, works as a Psychologist, forensic for [employer].  He is a former smoker.  Denies alcohol and drug use.  FAMILY HISTORY:  Significant for coronary artery disease in both parents.  ALLERGIES:  HEPARIN, he had heparin-induced thrombocytopenia  and PENICILLIN.  MEDICATIONS: 1. Aspirin 81 mg daily. 2. Losartan/HCTZ 125 daily. 3. Sublingual nitroglycerin. 4. Lipitor 40 mg 1/2 tab daily. 5. Effient 10 mg 1/2 tab daily. 6. Viagra 100 mg 1/2 tablet as needed. 7. Amlodipine 10 mg 1/2 tab daily. 8. Metoprolol XL 25 mg 1/2 tab daily.  REVIEW OF SYSTEMS:  Please see HPI, otherwise negative.  PHYSICAL EXAMINATION:  VITAL SIGNS:  Temperature 97.5, pulse 72, respiratory rate 18, blood pressure 130/78, O2 saturation 98% on 2 liters. GENERAL:  He is lying on the gurney in no apparent distress.  Does occasionally rub his eyes like he is dizzy. HEENT:  Atraumatic, normocephalic.  Extraocular muscles are intact. Pupils are equal, round, and reactive to light. CARDIOVASCULAR:  Regular rate and rhythm.  No murmurs. LUNGS:  Clear to auscultation bilaterally. ABDOMEN:  Positive bowel sounds, soft, nontender, nondistended. EXTREMITIES:  There is no edema.  Warm and well perfused. NEUROLOGIC:  He is alert and oriented x3, 5/5 strength in all extremities.  LABORATORY DATA AND STUDIES:  A basic metabolic panel showed sodium 139, potassium 3.5, chloride 104, bicarb 25, BUN 15, creatinine 0.65, glucose 162.  A CBC showed a white count of 14.7, hemoglobin 14, platelets 236. INR was 0.9.  Cardiac enzymes were negative x1.  A urinalysis was normal.  A chest x-ray which showed no acute cardiopulmonary disease, mild cardiomegaly.  CT of the head showed mild atrophy for age.  No acute findings.  An MRI of the head is pending and an ECG was stable from prior.  ASSESSMENT AND PLAN:  This is a 55 year old man with a history of coronary artery disease status post myocardial infarction, coronary artery bypass graft, hypertension, hyperlipidemia with dizziness and chest pain that could be related to unstable angina . 1.Chest pain rule out unstable angina:.  Per Cardiology who is initially going to admit     him.  He has a history of coronary disease  with an myocardial     infarction last cardiac cath in 2008, showed patent graft.  We will     continue him on his aspirin, but hold the Effient per Cardiology.     We will cycle his cardiac enzymes and recheck an ECG in the     morning.  We will give morphine IV for pain control. Telemetry.  2. Neurologic.  He had one seizure-like episodes and recurrent     episodes of vertigo.  CT of head was negative for acute processes.     The differential for vertigo include positional vertebrobasilar     insufficiency, diplopia, and vestibular migraines.  It does not     appear to be consistently related to position, he does not endorse any double     vision.  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     In the meantime, we will give meclizine x1 for dizziness. Concern for stroke or     TIA is low, given no focal neurologic deficits.  We will order     Neurology consult. 3. Hypertension.  We will continue his home medications except for the     hydrochlorothiazide which we will hold. 4. Hyperlipidemia.  We will continue his home medications. 5. Leukocytosis.  It is unclear what the etiology of this is.  He has     no signs or symptoms for an infection.  This could be secondary to     his episodes of nausea during the day.  We will continue to monitor     with the CBC in the morning. 6. Fluids, electrolytes, and nutrition, and Gastrointestinal.  A heart-     healthy diet, normal saline at 125 mL per hour. 7. Prophylaxis.  He will have sequential compression devices given his     history of heparin-induced thrombocytopenia. 8. Disposition pending improvement.    ______________________________ Despina Hick, MD   ______________________________ Nestor Ramp, MD    EB/MEDQ  D:  03/13/2011  T:  03/13/2011  Job:  [id number]  Electronically Signed by Denny Peon BOOTH MD on 03/13/2011 12:18:33 PM Electronically Signed  by Denny Levy MD on 03/24/2011 11:16:50 AM

## 2011-03-27 ENCOUNTER — Ambulatory Visit: Payer: 59 | Attending: Physician Assistant | Admitting: Physical Therapy

## 2011-03-27 DIAGNOSIS — H539 Unspecified visual disturbance: Secondary | ICD-10-CM | POA: Insufficient documentation

## 2011-03-27 DIAGNOSIS — R279 Unspecified lack of coordination: Secondary | ICD-10-CM | POA: Insufficient documentation

## 2011-03-27 DIAGNOSIS — IMO0001 Reserved for inherently not codable concepts without codable children: Secondary | ICD-10-CM | POA: Insufficient documentation

## 2011-03-27 DIAGNOSIS — R42 Dizziness and giddiness: Secondary | ICD-10-CM | POA: Insufficient documentation

## 2011-03-31 ENCOUNTER — Encounter: Payer: 59 | Admitting: Physical Therapy

## 2011-03-31 ENCOUNTER — Encounter: Payer: Self-pay | Admitting: Cardiovascular Disease

## 2011-04-03 ENCOUNTER — Encounter: Payer: Self-pay | Admitting: Cardiovascular Disease

## 2011-04-04 ENCOUNTER — Ambulatory Visit: Payer: 59 | Admitting: Physical Therapy

## 2011-04-07 ENCOUNTER — Ambulatory Visit: Payer: 59 | Admitting: Physical Therapy

## 2011-04-10 NOTE — Consult Note (Signed)
[name], [name]                 ACCOUNT NO.:  [id number]  MEDICAL RECORD NO.:  [id number]  LOCATION:  2032                         FACILITY:  [facility]  PHYSICIAN:  Arturo Morton. Riley Kill, MD, FACCDATE OF BIRTH:  1956-07-04  DATE OF CONSULTATION:  03/12/2011 DATE OF DISCHARGE:                                CONSULTATION   PRIMARY CARDIOLOGIST:  Antonieta Iba, MD  PATIENT PROFILE:  A 55 year old male with history of CAD status post CABG who presents to the ED with complaints of dizziness and vertiginous symptoms as well as chest pain whom we have been asked to evaluate.  PROBLEM LIST: 1. Presyncope/vertiginous symptoms and dizziness. 2. Coronary artery disease/chest pain.     a.     Status post cardiac arrest in May 2002, with catheterization      showing coronary vasospasm in the LAD and circumflex on Jan 22, 2001.     b.     On February 18, 2002, CABG x4 with placement of LIMA to the LAD,      vein graft to the diagonal, vein graft to the left circumflex,      vein graft to the distal RCA.     c.     On June 13, 2002, PTCA of the native circumflex through      the vein grafts.     d.     On July 01, 2007, last cardiac catheterization showing      left main normal.  LAD 100% proximal.  LIMA to LAD patent.  Vein      graft to diagonal patent.  Left circumflex 100% proximal.  Vein      graft to the circumflex patent.  RCA 100% proximal.  Vein graft to      the distal RCA patent.  PDA 50%.  EF was 45% with inferior      hypokinesis. 3. Remote tobacco abuse, 40 plus pack year history, quit in 2003. 4. Hypertension. 5. Hyperlipidemia. 6. Chronic fatigue. 7. Erectile dysfunction. 8. Status post neck surgery x2. 9. Status post hernia repair. 10.Status post appendectomy.  ALLERGIES:  HEPARIN causes heparin-induced thrombocytopenia, PENICILLIN and CODEINE.  HISTORY OF PRESENT ILLNESS:  A 55 year old male with the above problem list.  The patient is status post CABG x4 in  August 2003.  Postop course is complicated by cardiac arrest, ARDS, and heparin-induced thrombocytopenia.  He subsequently underwent PTCA of the native circumflex through the vein graft with his last catheterization in 2008, showing good revascularization.  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  He stopped the backhoe and got out and noted 5/10 chest pain that is worse with palpation and deep breathing.  He took nitroglycerin at work with minimal relief and presented to the ED.  Here, he was placed on IV nitroglycerin, resulting in feeling more lightheaded, dizzy, and nauseated, like he might pass out.  This was initially better once IV nitroglycerin was discontinued and his vital signs were stable throughout the  spell.  He still complains of 3/10 chest pain that is worse with palpation as well as intermittent lightheadedness with sitting up for even a few seconds.  While being interviewed by Dr. Riley Kill, the patient complained of worsening lightheadedness followed by nausea with vomiting and then seizure-like activity which has since resolved.  Neuro evaluation and CT are pending.  HOME MEDICATIONS: 1. Losartan/HCTZ 100/25 mg daily. 2. Aspirin 81 mg daily. 3. Amlodipine 25 mg daily. 4. Effient 5 mg daily (this was changed from Plavix in May 2011). 5. Toprol-XL 12.5 mg daily. 6. Viagra 100 mg p.r.n. 7. Nitroglycerin p.r.n. 8. Lipitor 20 mg daily.  FAMILY HISTORY:  Mother died at age 69 with complication of catheterization.  Father died at 72 of an MI.  He has 4 sisters, but does not know anything about their health status.  SOCIAL HISTORY:  The patient lives in [place] with his wife.  He drives the backhoe.  He has a 40 plus pack years of tobacco abuse, quitting in 2003.  He rarely has an alcoholic beverage and denies drug  use.  REVIEW OF SYSTEMS:  Positive for vertiginous like symptoms as outlined in the HPI as well as the chest pain, nausea, vomiting, and seizure-like activity.  He is a full code.  Otherwise, all systems reviewed is negative.  PHYSICAL EXAMINATION:  VITAL SIGNS:  Temperature 97.5, heart rate 58, respirations 16, blood pressure 115/64, pulse ox 98% on 2 liters. GENERAL:  Pleasant white male, in no acute distress, awake, alert, and oriented  x3.  He has a normal affect. HEENT:  Normal. NEUROLOGIC:  He has cranial nerves II-XII grossly intact.  Strength is 4/5 in bilateral upper and lower extremities.  Unable to assess cerebellar function secondary to inability to sit up and stand with vertiginous symptoms or with profound dizziness. NECK:  Supple without bruits or JVD. LUNGS:  Respirations are unlabored.  Clear to auscultation. CARDIAC:  Regular S1 and S2.  No S3, S4, or murmurs. ABDOMEN:  Round, soft, nontender, and nondistended.  Bowel sounds present x4.  His chest wall is notable for focal tenderness at the fifth intercostal space on top of the sternum. EXTREMITIES:  Warm, dry, and pink.  No clubbing, cyanosis, or edema. Dorsalis pedis is 1+ on the right, 2+ on the left.  Chest x-ray shows no acute disease, mild cardiomegaly.  EKG shows sinus rhythm at a rate of 62, normal axis.  He has Qs in lead III with no other acute ST-T changes.  Hemoglobin 14.0, hematocrit 37.9, WBC 14.7, platelets 236.  Sodium 136, potassium 3.5, chloride 104, CO2 25, BUN 15, creatinine 0.65, glucose 162.  BNP 81, CK 85, MB 1.6, troponin I less than 0.30.  Urinalysis negative.  ASSESSMENT AND PLAN: 1. Presyncope with dizziness and seizure activity.  The patient's     initial symptom occurred at 1 p.m. and described as dizziness,     lightheadedness, and feeling as if he is going to pass out.  He     later described as the room spinning and is not able to sit up or     stand at all.  Since being in the ER  and since our initial     evaluation, this has progressed to include nausea with vomiting and     subsequently seizure-like activity witnessed by Dr. Riley Kill.  The     patient is pending a head CT and neuro evaluation by the ER staff.     Certainly with question  cerebellar infarct.  Defer additional     management to Neurology and Internal Medicine. 2. Chest pain/coronary artery disease.  The patient presents with     somewhat atypical and easily reproducible chest pain with     palpation.  Enzymes so far negative, would continue to cycle.     Would continue home meds except Effient (question indication 9     years after percutaneous transluminal coronary angioplasty).  Would     hold diuretic with orthostatic like symptoms. 3. Hypertension, stable. 4. Hyperlipidemia, on Lipitor at home continue. 5. Leukocytosis.  Urinalysis negative and chest x-ray  without     evidence of infection.  He is afebrile.  We would follow.     Nicolasa Ducking, ANP   ______________________________ Arturo Morton. Riley Kill, MD, [facility]    CB/MEDQ  D:  03/12/2011  T:  03/13/2011  Job:  [id number]  Electronically Signed by Nicolasa Ducking ANP on 03/17/2011 10:39:51 AM Electronically Signed by Shawnie Pons MD [facility] on 04/10/2011 09:49:27 AM

## 2011-04-14 ENCOUNTER — Ambulatory Visit: Payer: 59 | Admitting: Physical Therapy

## 2011-04-14 ENCOUNTER — Encounter: Payer: 59 | Admitting: Physical Therapy

## 2011-04-18 ENCOUNTER — Encounter: Payer: 59 | Admitting: Physical Therapy

## 2011-05-22 ENCOUNTER — Telehealth: Payer: Self-pay | Admitting: Cardiovascular Disease

## 2011-05-22 NOTE — Telephone Encounter (Signed)
Pt wife is calling to speak to the nurse. Pt having severe pain in legs from the knees down. Wants to know what he should do. He is on a statin drug and thinks this may be the problem.

## 2011-05-22 NOTE — Telephone Encounter (Signed)
Spoke to wife, she says he is taking 1/2 tab of Lipitor 40mg , this was decr b/c lipids had improved. She says he has been having pain in legs from knees down for a couple weeks now and getting worse. I advised pt hold lipitor now throughout weekend and call me back on Monday for update, if still having pain need to look into other cause. Wife ok with this.

## 2011-05-23 LAB — CBC
HCT: 41.8
Hemoglobin: 14.3
MCHC: 34
MCV: 91.1
Platelets: 231
RBC: 4.59
RDW: 12.7
WBC: 10.4

## 2011-05-23 LAB — TYPE AND SCREEN
ABO/RH(D): O NEG
Antibody Screen: NEGATIVE

## 2011-05-23 LAB — BASIC METABOLIC PANEL
BUN: 8
CO2: 26
Calcium: 9.6
Chloride: 107
Creatinine, Ser: 0.84
GFR calc Af Amer: >60
GFR calc non Af Amer: >60
Glucose, Bld: 100 — ABNORMAL HIGH
Potassium: 3.9
Sodium: 141

## 2011-05-23 LAB — CARDIAC PANEL(CRET KIN+CKTOT+MB+TROPI)
CK, MB: 1.4
Relative Index: 0.9
Total CK: 161
Troponin I: 0.01

## 2011-05-23 LAB — CK TOTAL AND CKMB (NOT AT ARMC)
CK, MB: 1.7
Relative Index: 1.1
Total CK: 153

## 2011-05-23 LAB — TROPONIN I: Troponin I: <0.01

## 2011-05-23 LAB — ABO/RH: ABO/RH(D): O NEG

## 2011-05-28 ENCOUNTER — Telehealth: Payer: Self-pay | Admitting: *Deleted

## 2011-05-28 DIAGNOSIS — I25810 Atherosclerosis of coronary artery bypass graft(s) without angina pectoris: Secondary | ICD-10-CM

## 2011-05-28 DIAGNOSIS — I10 Essential (primary) hypertension: Secondary | ICD-10-CM

## 2011-05-28 DIAGNOSIS — E785 Hyperlipidemia, unspecified: Secondary | ICD-10-CM

## 2011-05-28 NOTE — Telephone Encounter (Signed)
Returning pt's call, he left msg on vm in regards to holding lipitor for leg pain. LMOM TCB.

## 2011-05-28 NOTE — Telephone Encounter (Signed)
Refer to previous phone note; pt's leg pain resolved after he held statin for the weekend. He restarted Monday, pain has returned (not as bad before) but is incr again. He takes Lipitor 40mg  1/2 tablet normally. Can we change to pravachol? Recent labs 02/2011 with good results. Please advise.

## 2011-05-30 ENCOUNTER — Ambulatory Visit: Payer: Self-pay | Admitting: Internal Medicine

## 2011-05-30 NOTE — Telephone Encounter (Signed)
Attempted to contact pt, LMOM TCB.  

## 2011-05-30 NOTE — Telephone Encounter (Signed)
Notified pt below, he will change to pravastatin 40mg  and will call with any problems. Otherwise will recheck labs in 3 months.

## 2011-05-30 NOTE — Telephone Encounter (Signed)
Could try pravastatin 40 mg daily Recheck lipids after 3 months

## 2011-06-03 ENCOUNTER — Other Ambulatory Visit: Payer: Self-pay | Admitting: Cardiovascular Disease

## 2011-06-03 MED ORDER — PRAVASTATIN SODIUM 40 MG PO TABS: 40.0000 mg | ORAL_TABLET | Freq: Every evening | ORAL | Status: DC

## 2011-06-23 ENCOUNTER — Encounter: Payer: Self-pay | Admitting: *Deleted

## 2011-06-26 ENCOUNTER — Ambulatory Visit: Payer: Self-pay | Admitting: Internal Medicine

## 2011-07-16 ENCOUNTER — Ambulatory Visit (INDEPENDENT_AMBULATORY_CARE_PROVIDER_SITE_OTHER): Payer: 59 | Admitting: Cardiovascular Disease

## 2011-07-16 ENCOUNTER — Encounter: Payer: Self-pay | Admitting: Cardiovascular Disease

## 2011-07-16 VITALS — BP 130/68 | HR 71 | Ht 60.0 in | Wt 206.2 lb

## 2011-07-16 DIAGNOSIS — I10 Essential (primary) hypertension: Secondary | ICD-10-CM

## 2011-07-16 DIAGNOSIS — I25810 Atherosclerosis of coronary artery bypass graft(s) without angina pectoris: Secondary | ICD-10-CM

## 2011-07-16 DIAGNOSIS — E785 Hyperlipidemia, unspecified: Secondary | ICD-10-CM

## 2011-07-16 DIAGNOSIS — I2510 Atherosclerotic heart disease of native coronary artery without angina pectoris: Secondary | ICD-10-CM

## 2011-07-16 DIAGNOSIS — R42 Dizziness and giddiness: Secondary | ICD-10-CM

## 2011-07-16 DIAGNOSIS — R079 Chest pain, unspecified: Secondary | ICD-10-CM

## 2011-07-16 MED ORDER — AMLODIPINE BESYLATE 10 MG PO TABS: 10.0000 mg | ORAL_TABLET | Freq: Every day | ORAL | Status: DC

## 2011-07-16 MED ORDER — METOPROLOL SUCCINATE ER 25 MG PO TB24: 12.5000 mg | ORAL_TABLET | Freq: Every day | ORAL | Status: DC

## 2011-07-16 MED ORDER — NITROGLYCERIN 0.4 MG SL SUBL: 0.4000 mg | SUBLINGUAL_TABLET | SUBLINGUAL | Status: DC | PRN

## 2011-07-16 MED ORDER — LOSARTAN POTASSIUM-HCTZ 100-25 MG PO TABS: 1.0000 | ORAL_TABLET | Freq: Every day | ORAL | Status: DC

## 2011-07-16 NOTE — Assessment & Plan Note (Signed)
No further episodes of dizziness or spinning. Suspect this was vestibular in nature.

## 2011-07-16 NOTE — Assessment & Plan Note (Signed)
Chronic chest pain with certain maneuvers. Taking nitroglycerin p.r.n. Symptoms have been chronic and in the past, cardiac catheterization has shown patent grafts. No further workup at this time. will continue aggressive medical management.

## 2011-07-16 NOTE — Progress Notes (Signed)
Patient ID: [name], male    DOB: [birth year], 55 y.o.   MRN: [id number]  HPI Comments: 55 year old gentleman with a history of three vessel CAD, bypass surgery, hyperlipidemia and hypertension, smoking history for 23 years who stopped in 2003,  who presents for routine follow up.   On previous office visits, [name] has had short episodes of chest discomfort described as a tingling pain sometimes with flushing and shortness of breath. These last several minutes. These have been chronic. These symptoms seem to wax and wane, coming on with exertion. It is about the same as before. He takes nitroglycerin periodically for his symptoms. He has not been escalating the frequency of nitroglycerin.   He does report having a very long day starting at 5:30 in the morning, and works late in the afternoon. By the end of the day, 53 he is exhausted. 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 This became an issue after his bypass surgery.  On previous visits, he was having profound vertigo and was admitted to the hospital on July 18, discharged on the 20th. 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 Recommendation was made to discontinue his effient and he was discharged on aspirin alone.  He has had problems with the shot of testosterone as it has caused a significant increase in his white blood cell count. He has been seeing hematology for this.  Last Cardiac Cath  Procedure date:  07/01/2007  1. Significant three-vessel CAD.  Patent LIMA to LAD with no severe significant disease in the LAD   beyond IMA insertion.  Patent vein graft to the diagonal with no disease beyond the  diagonal graft insertion.  Patent vein graft to the obtuse marginal, with no disease beyond the graft insertion.  Patent vein  graft to the right coronary artery with mild 50% percent narrowing of the right coronary artery beyond the graft  insertion.  Mildly depressed left ventricular systolic function.  Wall motion abnormalities noted above.    EKG shows normal sinus rhythm with rate 61 beats per minute left axis deviation, poor R-wave progression through the precordial leads.   Outpatient Encounter Prescriptions as of 07/16/2011  Medication Sig Dispense Refill  . amLODipine (NORVASC) 10 MG tablet Take 1 tablet (10 mg total) by mouth daily. 1/2 - 1 tab  30 tablet  6  . aspirin (ASPIR-81) 81 MG EC tablet Take 1 tablet (81 mg total) by mouth 2 (two) times daily.  60 tablet  11  . losartan-hydrochlorothiazide (HYZAAR) 100-25 MG per tablet Take 1 tablet by mouth daily.  30 tablet  6  . metoprolol succinate (TOPROL XL) 25 MG 24 hr tablet Take 0.5 tablets (12.5 mg total) by mouth daily.  30 tablet  6  . nitroGLYCERIN (NITROSTAT) 0.4 MG SL tablet Place 1 tablet (0.4 mg total) under the tongue every 5 (five) minutes as needed.  25 tablet  3  . pravastatin (PRAVACHOL) 40 MG tablet Take 1 tablet (40 mg total) by mouth every evening.  30 tablet  6  . sildenafil (VIAGRA) 100 MG tablet Take 50 mg by mouth daily as needed.          Review of Systems  Constitutional: Positive for fatigue.  HENT: Negative.   Eyes: Negative.   Respiratory: Positive  for chest tightness.   Cardiovascular: Positive for chest pain.  Gastrointestinal: Negative.   Musculoskeletal: Negative.        Mediastinal discomfort with heavy lifting and certain maneuvers.  Skin: Negative.   Hematological: Negative.   Psychiatric/Behavioral: Negative.   All other systems reviewed and are negative.    BP 130/68  Pulse 71  Ht 5' (1.524 m)  Wt 206 lb 4 oz (93.554 kg)  BMI 40.28 kg/m2  Physical Exam  Nursing note and vitals reviewed. Constitutional: He is oriented to person, place, and time. He appears well-developed and well-nourished.  HENT:  Head:  Normocephalic.  Nose: Nose normal.  Mouth/Throat: Oropharynx is clear and moist.  Eyes: Conjunctivae are normal. Pupils are equal, round, and reactive to light.  Neck: Normal range of motion. Neck supple. No JVD present.  Cardiovascular: Normal rate, regular rhythm, S1 normal, S2 normal, normal heart sounds and intact distal pulses.  Exam reveals no gallop and no friction rub.   No murmur heard.      Well-healed mediastinal scar, some tenderness with palpation.  Pulmonary/Chest: Effort normal and breath sounds normal. No respiratory distress. He has no wheezes. He has no rales. He exhibits no tenderness.  Abdominal: Soft. Bowel sounds are normal. He exhibits no distension. There is no tenderness.  Musculoskeletal: Normal range of motion. He exhibits no edema and no tenderness.  Lymphadenopathy:    He has no cervical adenopathy.  Neurological: He is alert and oriented to person, place, and time. Coordination normal.  Skin: Skin is warm and dry. No rash noted. No erythema.  Psychiatric: He has a normal mood and affect. His behavior is normal. Judgment and thought content normal.           Assessment and Plan

## 2011-07-16 NOTE — Assessment & Plan Note (Signed)
Suspect some of his chest pain is secondary to post surgical changes from his bypass surgery. Unable to exclude small vessel disease. If symptoms get worse, we would advance his long-acting nitrates, possibly start ranexa.

## 2011-07-16 NOTE — Assessment & Plan Note (Signed)
Cholesterol is at goal on the current lipid regimen. No changes to the medications were made.  

## 2011-07-16 NOTE — Assessment & Plan Note (Signed)
Blood pressure is well controlled on today's visit. No changes made to the medications. 

## 2011-07-16 NOTE — Patient Instructions (Signed)
You are doing well. No medication changes were made.  Please call us if you have new issues that need to be addressed before your next appt.  The office will contact you for a follow up Appt. In 6 months  

## 2011-07-18 ENCOUNTER — Other Ambulatory Visit: Payer: Self-pay

## 2011-08-07 ENCOUNTER — Other Ambulatory Visit: Payer: Self-pay | Admitting: Cardiovascular Disease

## 2011-08-08 ENCOUNTER — Telehealth: Payer: Self-pay

## 2011-08-08 MED ORDER — SILDENAFIL CITRATE 100 MG PO TABS: 50.0000 mg | ORAL_TABLET | Freq: Every day | ORAL | Status: DC | PRN

## 2011-08-08 NOTE — Telephone Encounter (Signed)
Refill sent for viagra.

## 2011-08-12 ENCOUNTER — Other Ambulatory Visit: Payer: Self-pay | Admitting: Cardiovascular Disease

## 2011-08-12 NOTE — Telephone Encounter (Signed)
Refill sent in for amlodipine 

## 2011-09-05 ENCOUNTER — Other Ambulatory Visit: Payer: 59 | Admitting: *Deleted

## 2011-09-15 ENCOUNTER — Telehealth: Payer: Self-pay | Admitting: *Deleted

## 2011-09-15 NOTE — Telephone Encounter (Signed)
Pt's wife called stating pt had filling done at dentist recently and he gave a script for ABX in case Dr. Mariah Milling wanted him to take post procedure. (Per pt, this is a new dentist and not familiar with pt and his cardiac hx very well and wanted to be safe; his previous dentist never gave ABX). Pt has h/o CAD with bypass x 3, no h/o valve surgery. I told pt's wife he would not need the abx if no valve sx, but will forward to Dr. Mariah Milling and confirm since script has already been written. I will call her back to verify whether she needs to pick up ABX or not.

## 2011-09-15 NOTE — Telephone Encounter (Signed)
Typically not needed. Up to the patient.

## 2011-09-16 NOTE — Telephone Encounter (Signed)
Spoke to patient's wife no need for antibiotics but if would like to have could send a Rx in.  The patient's wife was just checking; she thought that was the case and just wanted to be sure but will not have [place] call in an antibiotic.

## 2011-09-19 ENCOUNTER — Ambulatory Visit (INDEPENDENT_AMBULATORY_CARE_PROVIDER_SITE_OTHER): Payer: 59 | Admitting: *Deleted

## 2011-09-19 ENCOUNTER — Ambulatory Visit: Payer: 59 | Admitting: Cardiovascular Disease

## 2011-09-19 DIAGNOSIS — E785 Hyperlipidemia, unspecified: Secondary | ICD-10-CM

## 2011-09-20 LAB — LIPID PANEL
Cholesterol: 130 mg/dL (ref 0–200)
HDL: 41 mg/dL (ref >39)
LDL Cholesterol: 72 mg/dL (ref 0–99)
Total CHOL/HDL Ratio: 3.2 Ratio
Triglycerides: 85 mg/dL (ref <150)
VLDL: 17 mg/dL (ref 0–40)

## 2011-09-20 LAB — HEPATIC FUNCTION PANEL
ALT: 26 U/L (ref 0–53)
AST: 19 U/L (ref 0–37)
Albumin: 4.7 g/dL (ref 3.5–5.2)
Alkaline Phosphatase: 102 U/L (ref 39–117)
Bilirubin, Direct: 0.2 mg/dL (ref 0.0–0.3)
Indirect Bilirubin: 0.3 mg/dL (ref 0.0–0.9)
Total Bilirubin: 0.5 mg/dL (ref 0.3–1.2)
Total Protein: 6.8 g/dL (ref 6.0–8.3)

## 2011-11-24 ENCOUNTER — Other Ambulatory Visit: Payer: Self-pay | Admitting: Cardiovascular Disease

## 2011-11-28 ENCOUNTER — Ambulatory Visit: Payer: Self-pay | Admitting: Internal Medicine

## 2011-11-28 LAB — CBC CANCER CENTER
Basophil #: 0.1 x10 3/mm (ref 0.0–0.1)
Basophil %: 0.7 %
Eosinophil #: 0.7 x10 3/mm (ref 0.0–0.7)
Eosinophil %: 6 %
HCT: 42.6 % (ref 40.0–52.0)
HGB: 14.7 g/dL (ref 13.0–18.0)
Lymphocyte #: 2.4 x10 3/mm (ref 1.0–3.6)
Lymphocyte %: 21.9 %
MCH: 30.9 pg (ref 26.0–34.0)
MCHC: 34.6 g/dL (ref 32.0–36.0)
MCV: 89 fL (ref 80–100)
Monocyte #: 0.8 x10 3/mm — ABNORMAL HIGH (ref 0.0–0.7)
Monocyte %: 7.3 %
Neutrophil #: 7 x10 3/mm — ABNORMAL HIGH (ref 1.4–6.5)
Neutrophil %: 64.1 %
Platelet: 225 x10 3/mm (ref 150–440)
RBC: 4.77 10*6/uL (ref 4.40–5.90)
RDW: 12.8 % (ref 11.5–14.5)
WBC: 10.9 x10 3/mm — ABNORMAL HIGH (ref 3.8–10.6)

## 2011-12-23 ENCOUNTER — Other Ambulatory Visit: Payer: Self-pay | Admitting: Cardiovascular Disease

## 2011-12-23 NOTE — Telephone Encounter (Signed)
Refilled Viagra

## 2011-12-24 ENCOUNTER — Ambulatory Visit: Payer: Self-pay | Admitting: Internal Medicine

## 2011-12-26 LAB — CBC CANCER CENTER
Basophil #: 0.1 x10 3/mm (ref 0.0–0.1)
Basophil %: 0.5 %
Eosinophil #: 0.6 x10 3/mm (ref 0.0–0.7)
Eosinophil %: 5.5 %
HCT: 44 % (ref 40.0–52.0)
HGB: 14.8 g/dL (ref 13.0–18.0)
Lymphocyte #: 2.4 x10 3/mm (ref 1.0–3.6)
Lymphocyte %: 20.4 %
MCH: 30.1 pg (ref 26.0–34.0)
MCHC: 33.6 g/dL (ref 32.0–36.0)
MCV: 90 fL (ref 80–100)
Monocyte #: 0.9 x10 3/mm (ref 0.2–1.0)
Monocyte %: 7.4 %
Neutrophil #: 7.7 x10 3/mm — ABNORMAL HIGH (ref 1.4–6.5)
Neutrophil %: 66.2 %
Platelet: 224 x10 3/mm (ref 150–440)
RBC: 4.91 10*6/uL (ref 4.40–5.90)
RDW: 13 % (ref 11.5–14.5)
WBC: 11.7 x10 3/mm — ABNORMAL HIGH (ref 3.8–10.6)

## 2011-12-26 LAB — IRON AND TIBC
Iron Bind.Cap.(Total): 327 ug/dL (ref 250–450)
Iron Saturation: 32 %
Iron: 104 ug/dL (ref 65–175)
Unbound Iron-Bind.Cap.: 223 ug/dL

## 2011-12-26 LAB — FERRITIN: Ferritin (ARMC): 207 ng/mL (ref 8–388)

## 2012-01-08 ENCOUNTER — Other Ambulatory Visit: Payer: Self-pay | Admitting: Cardiovascular Disease

## 2012-01-09 ENCOUNTER — Ambulatory Visit: Payer: 59 | Admitting: Cardiovascular Disease

## 2012-01-16 ENCOUNTER — Ambulatory Visit (INDEPENDENT_AMBULATORY_CARE_PROVIDER_SITE_OTHER): Payer: 59 | Admitting: Cardiovascular Disease

## 2012-01-16 ENCOUNTER — Encounter: Payer: Self-pay | Admitting: Cardiovascular Disease

## 2012-01-16 VITALS — BP 140/80 | HR 64 | Ht 66.0 in | Wt 203.0 lb

## 2012-01-16 DIAGNOSIS — I25810 Atherosclerosis of coronary artery bypass graft(s) without angina pectoris: Secondary | ICD-10-CM

## 2012-01-16 DIAGNOSIS — I2510 Atherosclerotic heart disease of native coronary artery without angina pectoris: Secondary | ICD-10-CM

## 2012-01-16 DIAGNOSIS — R079 Chest pain, unspecified: Secondary | ICD-10-CM

## 2012-01-16 DIAGNOSIS — E785 Hyperlipidemia, unspecified: Secondary | ICD-10-CM

## 2012-01-16 DIAGNOSIS — I10 Essential (primary) hypertension: Secondary | ICD-10-CM

## 2012-01-16 MED ORDER — AMLODIPINE BESYLATE 10 MG PO TABS: ORAL_TABLET | ORAL | Status: DC

## 2012-01-16 MED ORDER — METOPROLOL SUCCINATE ER 25 MG PO TB24: 25.0000 mg | ORAL_TABLET | Freq: Every day | ORAL | Status: DC

## 2012-01-16 MED ORDER — LOSARTAN POTASSIUM-HCTZ 100-25 MG PO TABS: 1.0000 | ORAL_TABLET | Freq: Every day | ORAL | Status: DC

## 2012-01-16 MED ORDER — PRAVASTATIN SODIUM 40 MG PO TABS: 40.0000 mg | ORAL_TABLET | Freq: Every day | ORAL | Status: DC

## 2012-01-16 NOTE — Assessment & Plan Note (Signed)
Recent symptoms are consistent with musculoskeletal. He sounds like he is having rare pectoral cramping. Uncertain if this is from a statin. We did suggest he could do a trial and hold his pravastatin for one or 2 weeks to see if this improves his symptoms. He could try light stretching.

## 2012-01-16 NOTE — Patient Instructions (Signed)
You are doing well. No medication changes were made.  Please call us if you have new issues that need to be addressed before your next appt.  Your physician wants you to follow-up in: 6 months.  You will receive a reminder letter in the mail two months in advance. If you don't receive a letter, please call our office to schedule the follow-up appointment.   

## 2012-01-16 NOTE — Progress Notes (Signed)
Patient ID: [name], male    DOB: 11-06-1955, 56 y.o.   MRN: [id number]  HPI Comments: 56 year old gentleman with a history of three vessel CAD, bypass surgery, hyperlipidemia and hypertension, smoking history for 23 years who stopped in 2003,  who presents for routine follow up.   On previous office visits, [name] has had short episodes of chest discomfort described as a tingling pain sometimes with flushing and shortness of breath. These last several minutes. These have been chronic.  Today he reports having periodic short cramping of his right pectoral region that are intense. He feels it is a muscle cramping. He has them during the daytime as well as nighttime. Sometimes once a day or less. No significant shortness of breath or other chest pain concerning for angina. He is not taking nitroglycerin. 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   On previous visits, he was having profound vertigo and was admitted to the hospital on July 18, discharged on the 20th. 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 Recommendation was made to discontinue his effient and he was discharged on aspirin alone.  He continues to periodically see hematology for mildly elevated white blood cell count.  Last Cardiac Cath  Procedure date:  07/01/2007  1. Significant three-vessel CAD.  Patent LIMA to LAD with no severe significant disease in the LAD   beyond IMA insertion.  Patent vein graft to the diagonal with no disease beyond the  diagonal graft insertion.  Patent vein graft to the obtuse marginal, with no disease beyond the graft insertion.  Patent vein graft to the right coronary artery with mild 50% percent narrowing of the right coronary artery beyond the graft  insertion.  Mildly depressed left ventricular systolic function.  Wall motion abnormalities noted above.    EKG shows normal sinus rhythm with rate 64 beats per minute left axis  deviation, poor R-wave progression through the precordial leads, T wave abnormality in lead 1, aVL   Outpatient Encounter Prescriptions as of 01/16/2012  Medication Sig Dispense Refill  . amLODipine (NORVASC) 10 MG tablet TAKE 1/2 TO 1 TABLET ONCE DAILY  135 tablet  3  . aspirin 81 MG EC tablet Take 81 mg by mouth daily.      [name] losartan-hydrochlorothiazide (HYZAAR) 100-25 MG per tablet Take 1 tablet by mouth daily.  90 tablet  3  . metoprolol succinate (TOPROL-XL) 25 MG 24 hr tablet Take 1 tablet (25 mg total) by mouth daily.  90 tablet  3  . nitroGLYCERIN (NITROSTAT) 0.4 MG SL tablet Place 1 tablet (0.4 mg total) under the tongue every 5 (five) minutes as needed.  25 tablet  3  . pravastatin (PRAVACHOL) 40 MG tablet Take 1 tablet (40 mg total) by mouth daily.  90 tablet  3  . VIAGRA 100 MG tablet TAKE 1/2 TABLET BY MOUTH DAILY AS NEEDED.  10 tablet  0   Review of Systems  HENT: Negative.   Eyes: Negative.   Respiratory: Positive for chest tightness.   Gastrointestinal: Negative.   Musculoskeletal: Negative.        Mediastinal discomfort with heavy lifting and certain maneuvers.  Skin: Negative.   Hematological: Negative.   Psychiatric/Behavioral: Negative.   All other systems reviewed and are negative.    BP 140/80  Pulse 64  Ht 5\' 6"  (1.676 m)  Wt 203 lb (92.08 kg)  BMI 32.76 kg/m2  Physical Exam  Nursing note and vitals reviewed. Constitutional: He is oriented to person, place, and time. He appears well-developed and well-nourished.  HENT:  Head: Normocephalic.  Nose: Nose normal.  Mouth/Throat: Oropharynx is clear and moist.  Eyes: Conjunctivae are normal. Pupils are equal, round, and reactive to light.  Neck: Normal range of motion. Neck supple. No JVD present.  Cardiovascular: Normal rate, regular rhythm, S1 normal, S2 normal, normal heart sounds and intact distal pulses.  Exam reveals no gallop and no friction rub.   No murmur heard.      Well-healed mediastinal  scar, some tenderness with palpation.  Pulmonary/Chest: Effort normal and breath sounds normal. No respiratory distress. He has no wheezes. He has no rales. He exhibits no tenderness.  Abdominal: Soft. Bowel sounds are normal. He exhibits no distension. There is no tenderness.  Musculoskeletal: Normal range of motion. He exhibits no edema and no tenderness.  Lymphadenopathy:    He has no cervical adenopathy.  Neurological: He is alert and oriented to person, place, and time. Coordination normal.  Skin: Skin is warm and dry. No rash noted. No erythema.  Psychiatric: He has a normal mood and affect. His behavior is normal. Judgment and thought content normal.           Assessment and Plan

## 2012-01-16 NOTE — Assessment & Plan Note (Signed)
Currently with no symptoms of angina. No further workup at this time. Continue current medication regimen. 

## 2012-01-16 NOTE — Assessment & Plan Note (Signed)
Blood pressure is well controlled on today's visit. No changes made to the medications. 

## 2012-01-16 NOTE — Assessment & Plan Note (Signed)
Cholesterol is at goal on the current lipid regimen. No changes to the medications were made.  

## 2012-01-23 LAB — CBC CANCER CENTER
Basophil #: 0.1 x10 3/mm (ref 0.0–0.1)
Basophil %: 1.3 %
Eosinophil #: 0.8 x10 3/mm — ABNORMAL HIGH (ref 0.0–0.7)
Eosinophil %: 7.5 %
HCT: 44.2 % (ref 40.0–52.0)
HGB: 14.9 g/dL (ref 13.0–18.0)
Lymphocyte #: 2.6 x10 3/mm (ref 1.0–3.6)
Lymphocyte %: 23.9 %
MCH: 30.2 pg (ref 26.0–34.0)
MCHC: 33.8 g/dL (ref 32.0–36.0)
MCV: 89 fL (ref 80–100)
Monocyte #: 0.9 x10 3/mm (ref 0.2–1.0)
Monocyte %: 8 %
Neutrophil #: 6.4 x10 3/mm (ref 1.4–6.5)
Neutrophil %: 59.3 %
Platelet: 234 x10 3/mm (ref 150–440)
RBC: 4.94 10*6/uL (ref 4.40–5.90)
RDW: 12.8 % (ref 11.5–14.5)
WBC: 10.9 x10 3/mm — ABNORMAL HIGH (ref 3.8–10.6)

## 2012-01-24 ENCOUNTER — Ambulatory Visit: Payer: Self-pay | Admitting: Internal Medicine

## 2012-02-19 LAB — CBC CANCER CENTER
Basophil #: 0.1 x10 3/mm (ref 0.0–0.1)
Basophil %: 0.7 %
Eosinophil #: 0.9 x10 3/mm — ABNORMAL HIGH (ref 0.0–0.7)
Eosinophil %: 7.4 %
HCT: 44.2 % (ref 40.0–52.0)
HGB: 14.7 g/dL (ref 13.0–18.0)
Lymphocyte #: 2.7 x10 3/mm (ref 1.0–3.6)
Lymphocyte %: 22.9 %
MCH: 30 pg (ref 26.0–34.0)
MCHC: 33.3 g/dL (ref 32.0–36.0)
MCV: 90 fL (ref 80–100)
Monocyte #: 1 x10 3/mm (ref 0.2–1.0)
Monocyte %: 8.6 %
Neutrophil #: 7.2 x10 3/mm — ABNORMAL HIGH (ref 1.4–6.5)
Neutrophil %: 60.4 %
Platelet: 245 x10 3/mm (ref 150–440)
RBC: 4.9 10*6/uL (ref 4.40–5.90)
RDW: 12.6 % (ref 11.5–14.5)
WBC: 12 x10 3/mm — ABNORMAL HIGH (ref 3.8–10.6)

## 2012-02-23 ENCOUNTER — Ambulatory Visit: Payer: Self-pay | Admitting: Internal Medicine

## 2012-03-09 LAB — HEPATIC FUNCTION PANEL A (ARMC)
Albumin: 4.3 g/dL (ref 3.4–5.0)
Alkaline Phosphatase: 131 U/L (ref 50–136)
Bilirubin, Direct: 0.1 mg/dL (ref 0.00–0.20)
Bilirubin,Total: 0.6 mg/dL (ref 0.2–1.0)
SGOT(AST): 18 U/L (ref 15–37)
SGPT (ALT): 31 U/L
Total Protein: 7.8 g/dL (ref 6.4–8.2)

## 2012-03-09 LAB — CREATININE, SERUM
Creatinine: 0.78 mg/dL (ref 0.60–1.30)
EGFR (African American): >60
EGFR (Non-African Amer.): >60

## 2012-03-17 LAB — CEA: CEA: 1.8 ng/mL (ref 0.0–4.7)

## 2012-03-17 LAB — CANCER ANTIGEN 19-9: CA 19-9: 5 U/mL (ref 0–35)

## 2012-03-19 LAB — CBC CANCER CENTER
Basophil #: 0.1 x10 3/mm (ref 0.0–0.1)
Basophil %: 0.8 %
Eosinophil #: 0.8 x10 3/mm — ABNORMAL HIGH (ref 0.0–0.7)
Eosinophil %: 6.6 %
HCT: 43.4 % (ref 40.0–52.0)
HGB: 15.1 g/dL (ref 13.0–18.0)
Lymphocyte #: 2.5 x10 3/mm (ref 1.0–3.6)
Lymphocyte %: 21.7 %
MCH: 31.2 pg (ref 26.0–34.0)
MCHC: 34.8 g/dL (ref 32.0–36.0)
MCV: 90 fL (ref 80–100)
Monocyte #: 1 x10 3/mm (ref 0.2–1.0)
Monocyte %: 8.9 %
Neutrophil #: 7.3 x10 3/mm — ABNORMAL HIGH (ref 1.4–6.5)
Neutrophil %: 62 %
Platelet: 237 x10 3/mm (ref 150–440)
RBC: 4.84 10*6/uL (ref 4.40–5.90)
RDW: 12.3 % (ref 11.5–14.5)
WBC: 11.7 x10 3/mm — ABNORMAL HIGH (ref 3.8–10.6)

## 2012-03-19 LAB — CREATININE, SERUM
Creatinine: 0.84 mg/dL (ref 0.60–1.30)
EGFR (African American): >60
EGFR (Non-African Amer.): >60

## 2012-03-25 ENCOUNTER — Ambulatory Visit: Payer: Self-pay | Admitting: Internal Medicine

## 2012-05-24 ENCOUNTER — Other Ambulatory Visit: Payer: Self-pay | Admitting: Cardiovascular Disease

## 2012-06-03 ENCOUNTER — Other Ambulatory Visit: Payer: Self-pay | Admitting: Cardiovascular Disease

## 2012-06-03 NOTE — Telephone Encounter (Signed)
Refilled Simvastatin. 

## 2012-07-16 ENCOUNTER — Ambulatory Visit: Payer: 59 | Admitting: Cardiovascular Disease

## 2012-07-26 ENCOUNTER — Other Ambulatory Visit: Payer: Self-pay | Admitting: Cardiovascular Disease

## 2012-07-26 NOTE — Telephone Encounter (Signed)
Refill sent for losartan. 

## 2012-07-30 ENCOUNTER — Ambulatory Visit (INDEPENDENT_AMBULATORY_CARE_PROVIDER_SITE_OTHER): Payer: 59 | Admitting: Cardiovascular Disease

## 2012-07-30 ENCOUNTER — Encounter: Payer: Self-pay | Admitting: Cardiovascular Disease

## 2012-07-30 VITALS — BP 122/74 | HR 66 | Ht 66.0 in | Wt 202.0 lb

## 2012-07-30 DIAGNOSIS — R079 Chest pain, unspecified: Secondary | ICD-10-CM

## 2012-07-30 DIAGNOSIS — E785 Hyperlipidemia, unspecified: Secondary | ICD-10-CM

## 2012-07-30 DIAGNOSIS — I10 Essential (primary) hypertension: Secondary | ICD-10-CM

## 2012-07-30 DIAGNOSIS — R0602 Shortness of breath: Secondary | ICD-10-CM

## 2012-07-30 DIAGNOSIS — I25810 Atherosclerosis of coronary artery bypass graft(s) without angina pectoris: Secondary | ICD-10-CM

## 2012-07-30 MED ORDER — METOPROLOL SUCCINATE ER 25 MG PO TB24: 25.0000 mg | ORAL_TABLET | Freq: Every day | ORAL | Status: DC

## 2012-07-30 MED ORDER — LOSARTAN POTASSIUM-HCTZ 100-25 MG PO TABS: 1.0000 | ORAL_TABLET | Freq: Every day | ORAL | Status: DC

## 2012-07-30 MED ORDER — AMLODIPINE BESYLATE 10 MG PO TABS: ORAL_TABLET | ORAL | Status: DC

## 2012-07-30 MED ORDER — PRAVASTATIN SODIUM 40 MG PO TABS: 40.0000 mg | ORAL_TABLET | Freq: Every day | ORAL | Status: DC

## 2012-07-30 MED ORDER — SILDENAFIL CITRATE 100 MG PO TABS: 100.0000 mg | ORAL_TABLET | ORAL | Status: DC | PRN

## 2012-07-30 NOTE — Progress Notes (Signed)
Patient ID: [name], male    DOB: [birth year], 56 y.o.   MRN: [id number]  HPI Comments: 56 year old gentleman with a history of three vessel CAD, bypass surgery, hyperlipidemia and hypertension, smoking history for 23 years who stopped in 2003,  who presents for routine follow up.   On previous office visits, Mr. Klaus has had short episodes of chest discomfort described as a tingling pain sometimes with flushing and shortness of breath. These last several minutes. These have been chronic.   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  He has less stress, no after hours call on weekdays or weekends.  On previous visits, he was having profound vertigo and was admitted to the hospital on July 18, discharged on the 20th. 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 Recommendation was made to discontinue his effient and he was discharged on aspirin alone.  He continues to periodically see hematology for mildly elevated white blood cell count.  Last Cardiac Cath  Procedure date:  07/01/2007  1. Significant three-vessel CAD.  Patent LIMA to LAD with no severe significant disease in the LAD   beyond IMA insertion.  Patent vein graft to the diagonal with no disease beyond the  diagonal graft insertion.  Patent vein graft to the obtuse marginal, with no disease beyond the graft insertion.  Patent vein graft to the right coronary artery with mild 50% percent narrowing of the right coronary artery beyond the graft  insertion.  Mildly depressed left ventricular systolic function.  Wall motion abnormalities noted above.    EKG shows normal sinus rhythm with rate 66 beats per minute left axis deviation, poor R-wave progression through the precordial leads, T wave abnormality in lead 1, aVL   Outpatient Encounter Prescriptions as of 07/30/2012  Medication Sig Dispense Refill  . amLODipine (NORVASC) 10 MG tablet TAKE 1/2 TO 1 TABLET ONCE DAILY   30 tablet  11  . aspirin 81 MG EC tablet Take 81 mg by mouth daily.      [name] losartan-hydrochlorothiazide (HYZAAR) 100-25 MG per tablet Take 1 tablet by mouth daily.  30 tablet  11  . metoprolol succinate (TOPROL-XL) 25 MG 24 hr tablet Take 1 tablet (25 mg total) by mouth daily.  30 tablet  11  . nitroGLYCERIN (NITROSTAT) 0.4 MG SL tablet Place 1 tablet (0.4 mg total) under the tongue every 5 (five) minutes as needed.  25 tablet  3  . pravastatin (PRAVACHOL) 40 MG tablet Take 1 tablet (40 mg total) by mouth daily.  30 tablet  11  . sildenafil (VIAGRA) 100 MG tablet Take 1 tablet (100 mg total) by mouth as needed for erectile dysfunction.  10 tablet  6    Review of Systems  Constitutional: Negative.   HENT: Negative.   Eyes: Negative.   Cardiovascular: Negative.   Gastrointestinal: Negative.   Musculoskeletal: Negative.        Mediastinal discomfort with heavy lifting and certain maneuvers.  Skin: Negative.   Neurological: Negative.   Hematological: Negative.   Psychiatric/Behavioral: Negative.   All other systems reviewed and are negative.    BP 122/74  Pulse 66  Ht 5\' 6"  (1.676 m)  Wt 202 lb (91.627 kg)  BMI 32.60 kg/m2  Physical Exam  Nursing note and vitals reviewed. Constitutional: He is oriented to person, place, and time. He appears well-developed and well-nourished.  HENT:  Head: Normocephalic.  Nose: Nose normal.  Mouth/Throat:  Oropharynx is clear and moist.  Eyes: Conjunctivae normal are normal. Pupils are equal, round, and reactive to light.  Neck: Normal range of motion. Neck supple. No JVD present.  Cardiovascular: Normal rate, regular rhythm, S1 normal, S2 normal, normal heart sounds and intact distal pulses.  Exam reveals no gallop and no friction rub.   No murmur heard.      Well-healed mediastinal scar, some tenderness with palpation.  Pulmonary/Chest: Effort normal and breath sounds normal. No respiratory distress. He has no wheezes. He has no rales. He  exhibits no tenderness.  Abdominal: Soft. Bowel sounds are normal. He exhibits no distension. There is no tenderness.  Musculoskeletal: Normal range of motion. He exhibits no edema and no tenderness.  Lymphadenopathy:    He has no cervical adenopathy.  Neurological: He is alert and oriented to person, place, and time. Coordination normal.  Skin: Skin is warm and dry. No rash noted. No erythema.  Psychiatric: He has a normal mood and affect. His behavior is normal. Judgment and thought content normal.           Assessment and Plan

## 2012-07-30 NOTE — Assessment & Plan Note (Signed)
Atypical chest pain symptoms. No further workup at this time. Likely postsurgical.

## 2012-07-30 NOTE — Patient Instructions (Addendum)
You are doing well. No medication changes were made.  Please call us if you have new issues that need to be addressed before your next appt.  Your physician wants you to follow-up in: 6 months.  You will receive a reminder letter in the mail two months in advance. If you don't receive a letter, please call our office to schedule the follow-up appointment.   

## 2012-07-30 NOTE — Assessment & Plan Note (Signed)
Blood pressure is well controlled on today's visit. No changes made to the medications. 

## 2012-07-30 NOTE — Assessment & Plan Note (Signed)
Currently with no symptoms of angina. No further workup at this time. Continue current medication regimen. 

## 2012-07-30 NOTE — Assessment & Plan Note (Signed)
Cholesterol is at goal on the current lipid regimen. No changes to the medications were made.  

## 2012-07-31 LAB — HEPATIC FUNCTION PANEL
ALT: 29 IU/L (ref 0–44)
AST: 22 IU/L (ref 0–40)
Albumin: 4.6 g/dL (ref 3.5–5.5)
Alkaline Phosphatase: 100 IU/L (ref 39–117)
Bilirubin, Direct: 0.15 mg/dL (ref 0.00–0.40)
Total Bilirubin: 0.5 mg/dL (ref 0.0–1.2)
Total Protein: 6.8 g/dL (ref 6.0–8.5)

## 2012-07-31 LAB — LIPID PANEL
Chol/HDL Ratio: 2.9 ratio units (ref 0.0–5.0)
Cholesterol, Total: 134 mg/dL (ref 100–199)
HDL: 46 mg/dL (ref >39)
LDL Calculated: 61 mg/dL (ref 0–99)
Triglycerides: 135 mg/dL (ref 0–149)
VLDL Cholesterol Cal: 27 mg/dL (ref 5–40)

## 2012-08-09 ENCOUNTER — Encounter: Payer: Self-pay | Admitting: *Deleted

## 2012-11-01 ENCOUNTER — Other Ambulatory Visit: Payer: Self-pay | Admitting: Cardiovascular Disease

## 2012-11-01 NOTE — Telephone Encounter (Signed)
Refilled Pravastatin #30 Refill#3 sent to [facility].

## 2013-01-28 ENCOUNTER — Encounter: Payer: Self-pay | Admitting: Cardiovascular Disease

## 2013-01-28 ENCOUNTER — Ambulatory Visit (INDEPENDENT_AMBULATORY_CARE_PROVIDER_SITE_OTHER): Payer: 59 | Admitting: Cardiovascular Disease

## 2013-01-28 VITALS — BP 124/82 | HR 71 | Ht 66.0 in | Wt 204.0 lb

## 2013-01-28 DIAGNOSIS — R0602 Shortness of breath: Secondary | ICD-10-CM

## 2013-01-28 DIAGNOSIS — I25810 Atherosclerosis of coronary artery bypass graft(s) without angina pectoris: Secondary | ICD-10-CM

## 2013-01-28 DIAGNOSIS — N529 Male erectile dysfunction, unspecified: Secondary | ICD-10-CM

## 2013-01-28 DIAGNOSIS — I10 Essential (primary) hypertension: Secondary | ICD-10-CM

## 2013-01-28 DIAGNOSIS — E785 Hyperlipidemia, unspecified: Secondary | ICD-10-CM

## 2013-01-28 DIAGNOSIS — R079 Chest pain, unspecified: Secondary | ICD-10-CM

## 2013-01-28 MED ORDER — TADALAFIL 5 MG PO TABS: 5.0000 mg | ORAL_TABLET | Freq: Every day | ORAL | Status: DC | PRN

## 2013-01-28 NOTE — Assessment & Plan Note (Signed)
Cholesterol is at goal on the current lipid regimen. No changes to the medications were made.  

## 2013-01-28 NOTE — Assessment & Plan Note (Signed)
Currently with no symptoms of angina. No further workup at this time. Continue current medication regimen. 

## 2013-01-28 NOTE — Patient Instructions (Addendum)
You are doing well. No medication changes were made.  Please call us if you have new issues that need to be addressed before your next appt.  Your physician wants you to follow-up in: 6 months.  You will receive a reminder letter in the mail two months in advance. If you don't receive a letter, please call our office to schedule the follow-up appointment.   

## 2013-01-28 NOTE — Progress Notes (Signed)
Patient ID: [name], male    DOB: Dec 31, 1955, 57 y.o.   MRN: [id number]  HPI Comments: 57 year old gentleman with a history of three vessel CAD, bypass surgery, hyperlipidemia and hypertension, smoking history for 23 years who stopped in 2003,  who presents for routine follow up.   On previous office visits, [name] has had short episodes of chest discomfort described as a tingling pain sometimes with flushing and shortness of breath. These last several minutes. These have been chronic.   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 4 days per week, has Friday through Sunday off We wrote a letter for him to not do call in the evenings and weekends given underlying cardiac issues.  He does not do regular exercise during the week as he is doing long days and tired. Chest symptoms have been stable. Wife is concerned about previous history and the strenuous nature of his work He reports weight has been stable. No recent vertigo. He has had vertigo symptoms in the past Chronically elevated white blood cell count  Last Cardiac Cath  Procedure date:  07/01/2007  1. Significant three-vessel CAD.  Patent LIMA to LAD with no severe significant disease in the LAD   beyond IMA insertion.  Patent vein graft to the diagonal with no disease beyond the  diagonal graft insertion.  Patent vein graft to the obtuse marginal, with no disease beyond the graft insertion.  Patent vein graft to the right coronary artery with mild 50% percent narrowing of the right coronary artery beyond the graft  insertion.  Mildly depressed left ventricular systolic function.  Wall motion abnormalities noted above.    Total cholesterol in the 130 range, LDL 60 range  EKG shows normal sinus rhythm with rate 71 beats per minute left axis deviation, poor R-wave progression through the precordial leads   Outpatient Encounter Prescriptions as of 01/28/2013  Medication Sig Dispense  Refill  . amLODipine (NORVASC) 10 MG tablet TAKE 1/2 TO 1 TABLET ONCE DAILY  30 tablet  11  . aspirin 81 MG EC tablet Take 81 mg by mouth daily.      [name] losartan-hydrochlorothiazide (HYZAAR) 100-25 MG per tablet Take 1 tablet by mouth daily.  30 tablet  11  . metoprolol succinate (TOPROL-XL) 25 MG 24 hr tablet Take 12.5 mg by mouth daily.      . nitroGLYCERIN (NITROSTAT) 0.4 MG SL tablet Place 1 tablet (0.4 mg total) under the tongue every 5 (five) minutes as needed.  25 tablet  3  . pravastatin (PRAVACHOL) 40 MG tablet Take 1 tablet (40 mg total) by mouth daily.  30 tablet  11  . sildenafil (VIAGRA) 100 MG tablet Take 1 tablet (100 mg total) by mouth as needed for erectile dysfunction.  10 tablet  6  . [DISCONTINUED] metoprolol succinate (TOPROL-XL) 25 MG 24 hr tablet Take 1 tablet (25 mg total) by mouth daily.  30 tablet  11  . tadalafil (CIALIS) 5 MG tablet Take 1 tablet (5 mg total) by mouth daily as needed for erectile dysfunction.  30 tablet  6  . [DISCONTINUED] pravastatin (PRAVACHOL) 40 MG tablet TAKE 1 TABLET BY MOUTH AT BEDTIME  30 tablet  3   No facility-administered encounter medications on file as of 01/28/2013.     Review of Systems  Constitutional: Negative.   HENT: Negative.   Eyes: Negative.   Cardiovascular: Negative.   Gastrointestinal: Negative.   Musculoskeletal:  Negative.        Mediastinal discomfort with heavy lifting and certain maneuvers.  Skin: Negative.   Neurological: Negative.   Psychiatric/Behavioral: Negative.   All other systems reviewed and are negative.    BP 124/82  Pulse 71  Ht 5\' 6"  (1.676 m)  Wt 204 lb (92.534 kg)  BMI 32.94 kg/m2  Physical Exam  Nursing note and vitals reviewed. Constitutional: He is oriented to person, place, and time. He appears well-developed and well-nourished.  HENT:  Head: Normocephalic.  Nose: Nose normal.  Mouth/Throat: Oropharynx is clear and moist.  Eyes: Conjunctivae are normal. Pupils are equal, round, and  reactive to light.  Neck: Normal range of motion. Neck supple. No JVD present.  Cardiovascular: Normal rate, regular rhythm, S1 normal, S2 normal, normal heart sounds and intact distal pulses.  Exam reveals no gallop and no friction rub.   No murmur heard. Well-healed mediastinal scar, some tenderness with palpation.  Pulmonary/Chest: Effort normal and breath sounds normal. No respiratory distress. He has no wheezes. He has no rales. He exhibits no tenderness.  Abdominal: Soft. Bowel sounds are normal. He exhibits no distension. There is no tenderness.  Musculoskeletal: Normal range of motion. He exhibits no edema and no tenderness.  Lymphadenopathy:    He has no cervical adenopathy.  Neurological: He is alert and oriented to person, place, and time. Coordination normal.  Skin: Skin is warm and dry. No rash noted. No erythema.  Psychiatric: He has a normal mood and affect. His behavior is normal. Judgment and thought content normal.      Assessment and Plan

## 2013-01-28 NOTE — Assessment & Plan Note (Signed)
Blood pressure is well controlled on today's visit. No changes made to the medications. 

## 2013-01-28 NOTE — Assessment & Plan Note (Signed)
Viagra does not work well for him. We will try cialis. We did discuss that he may need further evaluation from urology. He reports testosterone is not low.

## 2013-03-24 ENCOUNTER — Telehealth: Payer: Self-pay | Admitting: *Deleted

## 2013-03-24 NOTE — Telephone Encounter (Signed)
Patient is having hot flashes, sob, dizzy and swelling of feet. Symptoms have been going on for about one week.

## 2013-03-24 NOTE — Telephone Encounter (Signed)
Returned call to pt states he has been having hot flashes and feeling dizzy x 1-2 months.  Started feeling poorly 3-4 days after last OV with Dr Mariah Milling, but reports being hard headed and not calling for sooner appt.  Pt states his feet and ankles B are swelling and reports some SOB as well.  Pt saw primary MD 2 weeks ago and had an URI treated with abx and inhalers.  Pt thought he was feeling poorly secondary to acute illness, but has completed abx and SOB and swelling persists.  Made pt OV with Alinda Money, [place] on 03/28/13.  Pt aware.  Advised pt to go to ED with wosening CP or SOB.

## 2013-03-28 ENCOUNTER — Encounter: Payer: Self-pay | Admitting: Physician Assistant

## 2013-03-28 ENCOUNTER — Ambulatory Visit (INDEPENDENT_AMBULATORY_CARE_PROVIDER_SITE_OTHER): Payer: 59 | Admitting: Physician Assistant

## 2013-03-28 ENCOUNTER — Ambulatory Visit: Payer: Self-pay | Admitting: Physician Assistant

## 2013-03-28 VITALS — BP 120/70 | HR 64 | Ht 66.0 in | Wt 204.5 lb

## 2013-03-28 DIAGNOSIS — R0602 Shortness of breath: Secondary | ICD-10-CM

## 2013-03-28 DIAGNOSIS — E785 Hyperlipidemia, unspecified: Secondary | ICD-10-CM

## 2013-03-28 DIAGNOSIS — I200 Unstable angina: Secondary | ICD-10-CM

## 2013-03-28 DIAGNOSIS — I10 Essential (primary) hypertension: Secondary | ICD-10-CM

## 2013-03-28 DIAGNOSIS — M79672 Pain in left foot: Secondary | ICD-10-CM

## 2013-03-28 DIAGNOSIS — I25810 Atherosclerosis of coronary artery bypass graft(s) without angina pectoris: Secondary | ICD-10-CM

## 2013-03-28 DIAGNOSIS — M79609 Pain in unspecified limb: Secondary | ICD-10-CM

## 2013-03-28 NOTE — Assessment & Plan Note (Signed)
Tender to palpation on the lateral left foot and with active flexion/extension at the MTP joint. There is no evidence of erythema or calor. No swelling or discoloration. The patient denies a history of gout or trauma to the area. He does perform manual labor. May represent arthritis or musculoskeletal etiologies. Advised on a short course of NSAIDs and continued monitoring. Followup with PCP for further evaluation/management.

## 2013-03-28 NOTE — Progress Notes (Signed)
Patient ID: [name], male   DOB: 05/11/1956, 57 y.o.   MRN: [id number]            Date:  03/28/2013   ID:  [name], DOB 08/27/1955, MRN [id number]  PCP:  Arlyss Queen  Primary Cardiologist:  Concha Se, MD   History of Present Illness:  [name] is a 57 y.o. male with PMHx s/f CAD (s/p CABG x 4- LIMA-LAD, VG-OM, VG-diag, VG-RCA 01/2002), h/o tobacco abuse, HTN, HLD and ED to presents today c/o swelling, shortness of breath and dizziness.   Reviewing the patient's records, his initial cardiac course was fairly complicated beginning with an out of hospital cardiac arrest. He was evaluated at that time and treated medically. He returned ~ 18 months later with recurrent anginal symptoms. He underwent cardiac cath revealing significant RCA spasm and 3V CAD. He subsequently underwent CABG complicated by intra-op VF arrest s/p open chest massage and defibrillation with ROSC. Post-op, he developed respiratory failure and had an extended admission.   He last followed up with Dr. Mariah Milling 09/2012. 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 Advised against working evenings or weekends. He has a history of chronic leukocytosis and vertigo.   He had been in his usual state of health until approximately 2 weeks ago. He has started to experience worsening exertional dyspnea, intermittent diaphoresis and hot flashes. He also reports worsening edema in his hands and feet worse at the end of the day and improved upon waking in the morning. He denies frank chest pain, PND, orthopnea, palpitations or syncope. He is accompanied by his wife today. They are both concerned about the development of these symptoms given his prior complicated cardiac history.  RECENT CARDIAC HISTORY  02/2011 2D echo: EF 60-65%, LA at upper limits of normal   04/2010 Lexiscan Myoview: no evidence of ischemia, previous infarct noted; EF 43%, inferior and septal HK   06/2007 cardiac cath: Significant  three-vessel CAD. Patent LIMA to LAD with no severe significant disease in the LAD beyond IMA insertion. Patent vein graft to the diagonal with no disease beyond the diagonal graft insertion. Patent vein graft to the obtuse marginal, with no disease beyond the graft insertion. Patent vein graft to the right coronary artery with mild 50% percent narrowing of the right coronary artery beyond the graft insertion. LVEF 45%, inferior HK.   EKG: NSR, 64 bpm, III, aVF, concomitant IVCD, Q wave V1-V2, TW blunting I, aVL, no ST changes (no changes from prior tracings)  Wt Readings from Last 3 Encounters:  03/28/13 204 lb 8 oz (92.761 kg)  01/28/13 204 lb (92.534 kg)  07/30/12 202 lb (91.627 kg)     Past Medical History  Diagnosis Date  . CAD (coronary artery disease)   . Chronic fatigue   . HTN (hypertension)   . HLD (hyperlipidemia)   . Leukocytosis     Current Outpatient Prescriptions  Medication Sig Dispense Refill  . amLODipine (NORVASC) 10 MG tablet TAKE 1/2 TO 1 TABLET ONCE DAILY  30 tablet  11  . aspirin 81 MG EC tablet Take 81 mg by mouth daily.      [name] losartan-hydrochlorothiazide (HYZAAR) 100-25 MG per tablet Take 1 tablet by mouth daily.  30 tablet  11  . metoprolol succinate (TOPROL-XL) 25 MG 24 hr tablet Take 12.5 mg by mouth daily.      . nitroGLYCERIN (NITROSTAT) 0.4 MG SL tablet Place 1 tablet (0.4 mg total) under the  tongue every 5 (five) minutes as needed.  25 tablet  3  . pravastatin (PRAVACHOL) 40 MG tablet Take 1 tablet (40 mg total) by mouth daily.  30 tablet  11  . sildenafil (VIAGRA) 100 MG tablet Take 1 tablet (100 mg total) by mouth as needed for erectile dysfunction.  10 tablet  6  . tadalafil (CIALIS) 5 MG tablet Take 1 tablet (5 mg total) by mouth daily as needed for erectile dysfunction.  30 tablet  6   No current facility-administered medications for this visit.    Allergies:    Allergies  Allergen Reactions  . Codeine     Breaks out into a sweat & rash  .  Heparin     Respiratory failure  . Penicillins     Rash     Social History:  The patient  reports that he quit smoking about 11 years ago. His smoking use included Cigarettes. He has a 21 pack-year smoking history. He does not have any smokeless tobacco history on file. He reports that he drinks about 0.5 ounces of alcohol per week. He reports that he does not use illicit drugs.   Family History:  Family History  Problem Relation Age of Onset  . Heart attack Mother 33  . Heart attack Father 48    Review of Systems: General: positive for hot flashes and diaphoresis, negative for chills, fever, night sweats or weight changes.  Cardiovascular: positive for exertional dyspnea, shortness of breath, negative for chest pain, edema, orthopnea, palpitations, paroxysmal nocturnal dyspnea Dermatological: negative for rash Respiratory: negative for cough or wheezing Urologic: negative for hematuria Abdominal: negative for nausea, vomiting, diarrhea, bright red blood per rectum, melena, or hematemesis Neurologic: negative for visual changes, syncope, or dizziness All other systems reviewed and are otherwise negative except as noted above.  PHYSICAL EXAM: VS:  BP 120/70  Pulse 64  Ht 5\' 6"  (1.676 m)  Wt 204 lb 8 oz (92.761 kg)  BMI 33.02 kg/m2 Well nourished, well developed, in no acute distress HEENT: normal, PERRL Neck: no JVD or bruits Cardiac:  normal S1, S2; RRR; no murmur or gallops Lungs:  clear to auscultation bilaterally, no wheezing, rhonchi or rales Abd: soft, nontender, no hepatomegaly, normoactive BS x 4 quads Ext: Robert bilateral pedal nonpitting edema, cyanosis or clubbing, + tenderness to palpation to lateral L foot, tenderness on active MTP joint flexion/extension, non-erythematous, no evidence of calor Skin: warm and dry, cap refill < 2 sec Neuro:  CNs 2-12 intact, no focal abnormalities noted Musculoskeletal: strength and tone appropriate for age  Psych: normal  affect

## 2013-03-28 NOTE — Assessment & Plan Note (Signed)
Continue statin. 

## 2013-03-28 NOTE — Patient Instructions (Addendum)
We will schedule a heart catheterization and inform you of the date and time of this procedure.   Prior to undergoing a heart catheterization, we will obtain lab work and a chest x-ray. This will be scheduled.   Please call the office or present to the near emergency department for worsening shortness of breath or chest pain.   [facility] Cardiac Cath Instructions   You are scheduled for a Cardiac Cath on:__________Thursday August 7th_______________  Please arrive at ___11:30___am on the day of your procedure  You will need to pre-register prior to the day of your procedure.  Enter through the [facility] at [facility].  Registration is the first desk on your right.  Please take the procedure order we have given you in order to be registered appropriately  Do not eat/drink anything after midnight  Someone will need to drive you home  It is recommended someone be with you for the first 24 hours after your procedure  Wear clothes that are easy to get on/off and wear slip on shoes if possible   Medications bring a current list of all medications with you  __X_ You may take all of your medications the morning of your procedure with enough water to swallow safely  ___ Do not take these medications before your procedure:_______________________________________________________________________________________________________________________________________________________________________________________________________________   Day of your procedure: [facility]rive at [facility] entrance.  Free valet service is available.  After entering the [facility] please check-in at the registration desk (1st desk on your right) to receive your armband. After receiving your armband someone will escort you to the cardiac cath/special procedures waiting area.  The usual length of stay after your procedure is about 2 to 3 hours.  This can vary.  If you have any questions, please call our office at [phone], or  you may call the cardiac cath lab at [facility] directly at [phone]

## 2013-03-28 NOTE — Assessment & Plan Note (Signed)
Well-controlled today. Continue current antihypertensives. 

## 2013-03-28 NOTE — Assessment & Plan Note (Addendum)
The patient presents today with a 2 week history of worsening dyspnea on exertion, intermittent diaphoresis and hot flashes. He also reports edema in his hands and feet at the end of the day. There was no appreciable pitting edema on exam today. The patient has a significant past cardiac history including 2 prior incidences of cardiac arrest and severe RCA coronary vasospasm. CABG ~ 11 years ago. He has known borderline coronary lesions from prior 2008 catheterization. Stress 3 years ago revealed no evidence of ischemia. The patient and his wife are quite concerned regarding the development of these symptoms given his cardiac history. Discussed with Dr. Mariah Milling and patient regarding further work-up. Will plan to proceed with diagnostic cardiac catheterization in light of his symptoms. The patient has an intolerance to Imdur- hypotension and presyncope. If cath reveals stable CAD and patent grafts, consider alternative etiologies of his symptoms including metabolic causes (i.e. thyroid abnormalities).

## 2013-03-29 LAB — CBC WITH DIFFERENTIAL/PLATELET
Basophils Absolute: 0.1 10*3/uL (ref 0.0–0.2)
Basos: 1 % (ref 0–3)
Eos: 8 % — ABNORMAL HIGH (ref 0–5)
Eosinophils Absolute: 0.9 10*3/uL — ABNORMAL HIGH (ref 0.0–0.4)
HCT: 44.9 % (ref 37.5–51.0)
Hemoglobin: 16.1 g/dL (ref 12.6–17.7)
Immature Grans (Abs): 0 10*3/uL (ref 0.0–0.1)
Immature Granulocytes: 0 % (ref 0–2)
Lymphocytes Absolute: 3 10*3/uL (ref 0.7–3.1)
Lymphs: 25 % (ref 14–46)
MCH: 32.1 pg (ref 26.6–33.0)
MCHC: 35.9 g/dL — ABNORMAL HIGH (ref 31.5–35.7)
MCV: 89 fL (ref 79–97)
Monocytes Absolute: 1.1 10*3/uL — ABNORMAL HIGH (ref 0.1–0.9)
Monocytes: 9 % (ref 4–12)
Neutrophils Absolute: 7 10*3/uL (ref 1.4–7.0)
Neutrophils Relative %: 57 % (ref 40–74)
RBC: 5.02 x10E6/uL (ref 4.14–5.80)
RDW: 12.2 % — ABNORMAL LOW (ref 12.3–15.4)
WBC: 12.1 10*3/uL — ABNORMAL HIGH (ref 3.4–10.8)

## 2013-03-29 LAB — BASIC METABOLIC PANEL
BUN/Creatinine Ratio: 11 (ref 9–20)
BUN: 9 mg/dL (ref 6–24)
CO2: 23 mmol/L (ref 18–29)
Calcium: 9.9 mg/dL (ref 8.7–10.2)
Chloride: 101 mmol/L (ref 97–108)
Creatinine, Ser: 0.85 mg/dL (ref 0.76–1.27)
GFR calc Af Amer: 114 mL/min/{1.73_m2} (ref >59)
GFR calc non Af Amer: 99 mL/min/{1.73_m2} (ref >59)
Glucose: 92 mg/dL (ref 65–99)
Potassium: 5 mmol/L (ref 3.5–5.2)
Sodium: 142 mmol/L (ref 134–144)

## 2013-03-29 LAB — PROTIME-INR
INR: 1 (ref 0.8–1.2)
Prothrombin Time: 10.4 s (ref 9.1–12.0)

## 2013-03-31 ENCOUNTER — Ambulatory Visit: Payer: Self-pay | Admitting: Cardiovascular Disease

## 2013-03-31 ENCOUNTER — Encounter: Payer: Self-pay | Admitting: *Deleted

## 2013-03-31 DIAGNOSIS — I2510 Atherosclerotic heart disease of native coronary artery without angina pectoris: Secondary | ICD-10-CM

## 2013-04-01 ENCOUNTER — Encounter: Payer: Self-pay | Admitting: Cardiovascular Disease

## 2013-04-11 ENCOUNTER — Other Ambulatory Visit: Payer: Self-pay | Admitting: Cardiovascular Disease

## 2013-04-15 ENCOUNTER — Ambulatory Visit (INDEPENDENT_AMBULATORY_CARE_PROVIDER_SITE_OTHER): Payer: 59 | Admitting: Cardiovascular Disease

## 2013-04-15 ENCOUNTER — Encounter: Payer: Self-pay | Admitting: Cardiovascular Disease

## 2013-04-15 VITALS — BP 112/68 | HR 68 | Ht 66.0 in | Wt 205.1 lb

## 2013-04-15 DIAGNOSIS — I10 Essential (primary) hypertension: Secondary | ICD-10-CM

## 2013-04-15 DIAGNOSIS — E785 Hyperlipidemia, unspecified: Secondary | ICD-10-CM

## 2013-04-15 DIAGNOSIS — R079 Chest pain, unspecified: Secondary | ICD-10-CM

## 2013-04-15 DIAGNOSIS — I25810 Atherosclerosis of coronary artery bypass graft(s) without angina pectoris: Secondary | ICD-10-CM

## 2013-04-15 NOTE — Assessment & Plan Note (Signed)
Blood pressure is running low. We have suggested he cut his losartan HCTZ in half. Continue to hold the metoprolol given significant bradycardia and malaise noted following his catheterization.

## 2013-04-15 NOTE — Progress Notes (Signed)
Patient ID: [name], male    DOB: 1955-10-16, 57 y.o.   MRN: [id number]  HPI Comments: 57 year old gentleman with a history of three vessel CAD, bypass surgery, hyperlipidemia and hypertension, smoking history for 23 years who stopped in 2003,  who presents for routine follow up.  Recent office visits, he reported short episodes of chest pain. Symptoms were progressive, concerning for angina. He underwent repeat cardiac catheterization last month that showed patent grafts severe three-vessel disease. Medical management was recommended. In recovery following a cardiac catheterization, he had episodes of sweating/flushing, malaise associated with bradycardia. Initially it was felt that this was a vagal response to pressure being applied to his groin. Later in recovery, he continued to have bradycardia. He was told to hold his metoprolol at the time of discharge.  In followup today, he reports that previous symptoms of flushing, dizziness, malaise that he was having at home prior to the procedure have now resolved. He attributes the symptoms to the metoprolol and the witnessed bradycardia. There was no significant hypotension noted during these episodes. No significant chest pain symptoms.  Last Cardiac Cath  Procedure date:  07/01/2007  1. Significant three-vessel CAD.  Patent LIMA to LAD with no severe significant disease in the LAD   beyond IMA insertion.  Patent vein graft to the diagonal with no disease beyond the  diagonal graft insertion.  Patent vein graft to the obtuse marginal, with no disease beyond the graft insertion.  Patent vein graft to the right coronary artery with mild 50% percent narrowing of the right coronary artery beyond the graft  insertion.  Mildly depressed left ventricular systolic function.  Wall motion abnormalities noted above.    Total cholesterol in the 130 range, LDL 60 range  EKG shows normal sinus rhythm with rate 63 beats per minute left axis deviation, unable  to exclude old anterior MI   Outpatient Encounter Prescriptions as of 04/15/2013  Medication Sig Dispense Refill  . amLODipine (NORVASC) 10 MG tablet TAKE 1/2 TO 1 TABLET ONCE DAILY  30 tablet  11  . aspirin 81 MG EC tablet Take 81 mg by mouth daily.      [name] losartan-hydrochlorothiazide (HYZAAR) 100-25 MG per tablet Take 1 tablet by mouth daily.  30 tablet  11  . nitroGLYCERIN (NITROSTAT) 0.4 MG SL tablet Place 1 tablet (0.4 mg total) under the tongue every 5 (five) minutes as needed.  25 tablet  3  . pravastatin (PRAVACHOL) 40 MG tablet TAKE 1 TABLET BY MOUTH AT BEDTIME  30 tablet  6  . sildenafil (VIAGRA) 100 MG tablet Take 1 tablet (100 mg total) by mouth as needed for erectile dysfunction.  10 tablet  6  . tadalafil (CIALIS) 5 MG tablet Take 1 tablet (5 mg total) by mouth daily as needed for erectile dysfunction.  30 tablet  6  . VENTOLIN HFA 108 (90 BASE) MCG/ACT inhaler Inhale 2 puffs into the lungs as needed.       . [DISCONTINUED] metoprolol succinate (TOPROL-XL) 25 MG 24 hr tablet Take 12.5 mg by mouth daily.      . [DISCONTINUED] pravastatin (PRAVACHOL) 40 MG tablet Take 1 tablet (40 mg total) by mouth daily.  30 tablet  11    Review of Systems  Constitutional: Negative.   HENT: Negative.   Eyes: Negative.   Respiratory: Negative.   Cardiovascular: Negative.   Gastrointestinal: Negative.   Musculoskeletal: Negative.        Mediastinal discomfort with heavy lifting and  certain maneuvers.  Skin: Negative.   Neurological: Negative.   Psychiatric/Behavioral: Negative.   All other systems reviewed and are negative.    BP 112/68  Pulse 68  Ht 5\' 6"  (1.676 m)  Wt 205 lb 1.9 oz (93.042 kg)  BMI 33.12 kg/m2  Physical Exam  Nursing note and vitals reviewed. Constitutional: He is oriented to person, place, and time. He appears well-developed and well-nourished.  HENT:  Head: Normocephalic.  Nose: Nose normal.  Mouth/Throat: Oropharynx is clear and moist.  Eyes: Conjunctivae  are normal. Pupils are equal, round, and reactive to light.  Neck: Normal range of motion. Neck supple. No JVD present.  Cardiovascular: Normal rate, regular rhythm, S1 normal, S2 normal, normal heart sounds and intact distal pulses.  Exam reveals no gallop and no friction rub.   No murmur heard. Well-healed mediastinal scar, some tenderness with palpation.  Pulmonary/Chest: Effort normal and breath sounds normal. No respiratory distress. He has no wheezes. He has no rales. He exhibits no tenderness.  Abdominal: Soft. Bowel sounds are normal. He exhibits no distension. There is no tenderness.  Musculoskeletal: Normal range of motion. He exhibits no edema and no tenderness.  Lymphadenopathy:    He has no cervical adenopathy.  Neurological: He is alert and oriented to person, place, and time. Coordination normal.  Skin: Skin is warm and dry. No rash noted. No erythema.  Psychiatric: He has a normal mood and affect. His behavior is normal. Judgment and thought content normal.      Assessment and Plan

## 2013-04-15 NOTE — Assessment & Plan Note (Signed)
Cholesterol is at goal on the current lipid regimen. No changes to the medications were made.  

## 2013-04-15 NOTE — Patient Instructions (Addendum)
You are doing well. Cut the losartan HCTZ in 1/2 daily Hold the metoprolol  Please call us if you have new issues that need to be addressed before your next appt.  Your physician wants you to follow-up in: 6 months.  You will receive a reminder letter in the mail two months in advance. If you don't receive a letter, please call our office to schedule the follow-up appointment.

## 2013-04-15 NOTE — Assessment & Plan Note (Signed)
Does not report having significant chest pain on today's visit. Recent cardiac catheterization with patent grafts

## 2013-04-15 NOTE — Assessment & Plan Note (Signed)
Currently with no symptoms of angina. No further workup at this time. Continue current medication regimen. 

## 2013-06-09 ENCOUNTER — Emergency Department (HOSPITAL_COMMUNITY)
Admission: EM | Admit: 2013-06-09 | Discharge: 2013-06-09 | Disposition: A | Discharge status: Home / Self Care | Payer: 59 | Attending: Emergency Medicine | Admitting: Emergency Medicine

## 2013-06-09 ENCOUNTER — Encounter (HOSPITAL_COMMUNITY): Payer: Self-pay | Admitting: Emergency Medicine

## 2013-06-09 DIAGNOSIS — S6990XA Unspecified injury of unspecified wrist, hand and finger(s), initial encounter: Secondary | ICD-10-CM | POA: Insufficient documentation

## 2013-06-09 DIAGNOSIS — R61 Generalized hyperhidrosis: Secondary | ICD-10-CM | POA: Insufficient documentation

## 2013-06-09 DIAGNOSIS — S6991XA Unspecified injury of right wrist, hand and finger(s), initial encounter: Secondary | ICD-10-CM

## 2013-06-09 DIAGNOSIS — W268XXA Contact with other sharp object(s), not elsewhere classified, initial encounter: Secondary | ICD-10-CM | POA: Insufficient documentation

## 2013-06-09 DIAGNOSIS — Z862 Personal history of diseases of the blood and blood-forming organs and certain disorders involving the immune mechanism: Secondary | ICD-10-CM | POA: Insufficient documentation

## 2013-06-09 DIAGNOSIS — Y9389 Activity, other specified: Secondary | ICD-10-CM | POA: Insufficient documentation

## 2013-06-09 DIAGNOSIS — E785 Hyperlipidemia, unspecified: Secondary | ICD-10-CM | POA: Insufficient documentation

## 2013-06-09 DIAGNOSIS — Z87891 Personal history of nicotine dependence: Secondary | ICD-10-CM | POA: Insufficient documentation

## 2013-06-09 DIAGNOSIS — Z880 Allergy status to penicillin: Secondary | ICD-10-CM | POA: Insufficient documentation

## 2013-06-09 DIAGNOSIS — I2510 Atherosclerotic heart disease of native coronary artery without angina pectoris: Secondary | ICD-10-CM | POA: Insufficient documentation

## 2013-06-09 DIAGNOSIS — T148XXA Other injury of unspecified body region, initial encounter: Secondary | ICD-10-CM

## 2013-06-09 DIAGNOSIS — Y929 Unspecified place or not applicable: Secondary | ICD-10-CM | POA: Insufficient documentation

## 2013-06-09 DIAGNOSIS — Z79899 Other long term (current) drug therapy: Secondary | ICD-10-CM | POA: Insufficient documentation

## 2013-06-09 DIAGNOSIS — Z951 Presence of aortocoronary bypass graft: Secondary | ICD-10-CM | POA: Insufficient documentation

## 2013-06-09 DIAGNOSIS — I10 Essential (primary) hypertension: Secondary | ICD-10-CM | POA: Insufficient documentation

## 2013-06-09 DIAGNOSIS — Z23 Encounter for immunization: Secondary | ICD-10-CM | POA: Insufficient documentation

## 2013-06-09 DIAGNOSIS — S61409A Unspecified open wound of unspecified hand, initial encounter: Secondary | ICD-10-CM | POA: Insufficient documentation

## 2013-06-09 DIAGNOSIS — Z7982 Long term (current) use of aspirin: Secondary | ICD-10-CM | POA: Insufficient documentation

## 2013-06-09 MED ORDER — TETANUS-DIPHTH-ACELL PERTUSSIS 5-2.5-18.5 LF-MCG/0.5 IM SUSP
0.5000 mL | Freq: Once | INTRAMUSCULAR | Status: AC
  Administered 2013-06-09: 0.5 mL via INTRAMUSCULAR
  Filled 2013-06-09: qty 0.5

## 2013-06-09 MED ORDER — TRAMADOL HCL 50 MG PO TABS
50.0000 mg | ORAL_TABLET | Freq: Once | ORAL | Status: AC
  Administered 2013-06-09: 50 mg via ORAL
  Filled 2013-06-09: qty 1

## 2013-06-09 NOTE — ED Notes (Signed)
Pt reports he was reaching for a hammer and jabbed a piece of wood sticking up from tree stump into hand webb between index and middle finger on right hand.  Pain 5/10.  Went to urgent care, sent here to see if wood is still in hand.  Does not recall last tetanus.

## 2013-06-09 NOTE — ED Provider Notes (Signed)
Patient presents with possible foreign body in the hand after his hand hit a stump with a shard of wood. On exam he has tenderness specifically on the palmar surface of the hand, but that ultrasound reveals possible organic material, because of the depth of the wound and the location and surgery was consulted for foreign body exploration and removal. Patient appears otherwise stable, and about ice and discharge after procedure.  Medical screening examination/treatment/procedure(s) were conducted as a shared visit with non-physician practitioner(s) and myself.  I personally evaluated the patient during the encounter.        Vida Roller, MD 06/09/13 [id number]

## 2013-06-09 NOTE — ED Provider Notes (Signed)
CSN: [id number]     Arrival date & time 06/09/13  1918 History  This chart was scribed for non-physician practitioner Mckinley Jewel, PA-C working with Vida Roller, MD by Clydene Laming, ED Scribe. This patient was seen in room TR09C/TR09C and the patient's care was started at 8:16 PM.   Chief Complaint  Patient presents with  . Hand Injury   The history is provided by the patient. No language interpreter was used.   HPI Comments: [name] is a 57 y.o. male who presents to the Emergency Department complaining of right hand injury that occurred at 5:30 PM while pt was building a lean tool on his shed. Pt states he threw his hammer on the ground and when he attempted to pick it back up a piece of a wooden object went straight in between the pointer and middle finger.The pt first reported to urgent care where they instructed him to report to the ED. The patients wife states shortly after the incident he began to experience some diaphoresis. Bleeding is controlled.  Past Medical History  Diagnosis Date  . CAD (coronary artery disease)   . Chronic fatigue   . HTN (hypertension)   . HLD (hyperlipidemia)   . Leukocytosis    Past Surgical History  Procedure Laterality Date  . Coronary artery bypass graft    . Neck surgery      x2  . Hernia repair    . Appendectomy     Family History  Problem Relation Age of Onset  . Heart attack Mother 40  . Heart attack Father 55   History  Substance Use Topics  . Smoking status: Former Smoker -- 1.00 packs/day for 21 years    Types: Cigarettes    Quit date: 03/20/2002  . Smokeless tobacco: Not on file     Comment: Tobacco use - no  . Alcohol Use: 0.5 oz/week    1 drink(s) per week     Comment: rare    Review of Systems  Constitutional: Positive for diaphoresis.  Skin: Positive for wound (Right hand).  All other systems reviewed and are negative.    Allergies  Codeine; Heparin; and Penicillins  Home Medications   Current Outpatient  Rx  Name  Route  Sig  Dispense  Refill  . amLODipine (NORVASC) 10 MG tablet   Oral   Take 5 mg by mouth daily.         [name] aspirin 81 MG EC tablet   Oral   Take 162 mg by mouth daily.          [name] losartan-hydrochlorothiazide (HYZAAR) 100-25 MG per tablet   Oral   Take 0.5 tablets by mouth daily.         . nitroGLYCERIN (NITROSTAT) 0.4 MG SL tablet   Sublingual   Place 1 tablet (0.4 mg total) under the tongue every 5 (five) minutes as needed.   25 tablet   3   . pravastatin (PRAVACHOL) 40 MG tablet      TAKE 1 TABLET BY MOUTH AT BEDTIME   30 tablet   6   . sildenafil (VIAGRA) 100 MG tablet   Oral   Take 1 tablet (100 mg total) by mouth as needed for erectile dysfunction.   10 tablet   6   . tadalafil (CIALIS) 5 MG tablet   Oral   Take 1 tablet (5 mg total) by mouth daily as needed for erectile dysfunction.   30 tablet   6   .  VENTOLIN HFA 108 (90 BASE) MCG/ACT inhaler   Inhalation   Inhale 2 puffs into the lungs as needed.           Triage Vitals:BP 151/87  Pulse 75  Temp(Src) 97.8 F (36.6 C) (Oral)  Resp 20  SpO2 98% Physical Exam  Nursing note and vitals reviewed. Constitutional: He is oriented to person, place, and time. He appears well-developed and well-nourished. No distress.  HENT:  Head: Normocephalic and atraumatic.  Eyes: EOM are normal.  Neck: Neck supple. No tracheal deviation present.  Cardiovascular: Normal rate.   Pulmonary/Chest: Effort normal. No respiratory distress.  Musculoskeletal: Normal range of motion.  Neurological: He is alert and oriented to person, place, and time.  Skin: Skin is warm and dry.  Psychiatric: He has a normal mood and affect. His behavior is normal.    ED Course  Procedures (including critical care time) DIAGNOSTIC STUDIES: Oxygen Saturation is 98% on RA, normal by my interpretation.    COORDINATION OF CARE: 8:19 PM- Discussed treatment plan with pt at bedside. Pt verbalized understanding and  agreement with plan.   Labs Review Labs Reviewed - No data to display Imaging Review No results found.  EKG Interpretation   None       MDM   1. Hand injury, right, initial encounter   2. Puncture wound    10:52 PM Dr. Izora Ribas explored the wound and did not find any foreign object. Previously thought to have visualized foreign object on ultrasound. Patient will be discharged with pain medication and antibiotics. Patient will follow up with Dr. Izora Ribas in 1 week. Patient instructed to return to the ED with worsening or concerning symptoms.    I personally performed the services described in this documentation, which was scribed in my presence. The recorded information has been reviewed and is accurate.     Emilia Beck, PA-C 06/09/13 [id number]

## 2013-06-09 NOTE — ED Notes (Signed)
Present with Possible FB in right hand, accident occurred at 5:30 this evening while working in tool shed, went to grab a hammer and a wooden stick went straight in between pointer finger and middle finger.  Cms intact.

## 2013-06-09 NOTE — Consult Note (Signed)
Reason for Consult:injury R hand Referring Physician: ER  [name] is an 57 y.o. right handed male.  HPI: pt was building a part of shed, reached down to pick up hammer and was impaled by a tree stump piece of wood.  C/o pain, bleeding, ?retained fb; [place] at ER ?? Small fb.  Past Medical History  Diagnosis Date  . CAD (coronary artery disease)   . Chronic fatigue   . HTN (hypertension)   . HLD (hyperlipidemia)   . Leukocytosis     Past Surgical History  Procedure Laterality Date  . Coronary artery bypass graft    . Neck surgery      x2  . Hernia repair    . Appendectomy      Family History  Problem Relation Age of Onset  . Heart attack Mother 69  . Heart attack Father 32    Social History:  reports that he quit smoking about 11 years ago. His smoking use included Cigarettes. He has a 21 pack-year smoking history. He does not have any smokeless tobacco history on file. He reports that he drinks about 0.5 ounces of alcohol per week. He reports that he does not use illicit drugs.  Allergies:  Allergies  Allergen Reactions  . Codeine     Breaks out into a sweat & rash  . Heparin     Respiratory failure  . Penicillins     Rash     Medications: I have reviewed the patient's current medications.  No results found for this or any previous visit (from the past 48 hour(s)).  No results found.  Pertinent items are noted in HPI. Temp:  [97.8 F (36.6 C)] 97.8 F (36.6 C) (10/16 1924) Pulse Rate:  [75] 75 (10/16 1924) Resp:  [20] 20 (10/16 1924) BP: (151)/(87) 151/87 mmHg (10/16 1924) SpO2:  [98 %] 98 % (10/16 1924) General appearance: alert and cooperative Resp: clear to auscultation bilaterally Cardio: regular rate and rhythm GI: soft, non-tender; bowel sounds normal; no masses,  no organomegaly Extremities: extremities normal, atraumatic, no cyanosis or edema and except for R hand with small puncture wound between IF, LF, tenderness an d bruising over MC head of  LF   Assessment/Plan: Puncture wound R hand, ? Retained fb Plan: Area anesthetized with local anesthetic, wound lengthened and explored, no evidence (visual or palpable) of retained fb, wound irrigated and loosely closed; will place on abx, f/u in office  [name] 06/09/2013, 10:37 PM

## 2013-08-08 ENCOUNTER — Other Ambulatory Visit: Payer: Self-pay | Admitting: Cardiovascular Disease

## 2013-08-08 ENCOUNTER — Other Ambulatory Visit: Payer: Self-pay | Admitting: *Deleted

## 2013-08-08 MED ORDER — LOSARTAN POTASSIUM-HCTZ 100-25 MG PO TABS: 0.5000 | ORAL_TABLET | Freq: Every day | ORAL | Status: DC

## 2013-08-08 NOTE — Telephone Encounter (Signed)
Requested Prescriptions   Signed Prescriptions Disp Refills  . losartan-hydrochlorothiazide (HYZAAR) 100-25 MG per tablet 30 tablet 3    Sig: Take 0.5 tablets by mouth daily.    Authorizing Provider: Antonieta Iba    Ordering User: Kendrick Fries

## 2013-09-12 ENCOUNTER — Other Ambulatory Visit: Payer: Self-pay | Admitting: Cardiovascular Disease

## 2013-09-30 ENCOUNTER — Ambulatory Visit (INDEPENDENT_AMBULATORY_CARE_PROVIDER_SITE_OTHER): Payer: 59 | Admitting: Cardiovascular Disease

## 2013-09-30 ENCOUNTER — Encounter: Payer: Self-pay | Admitting: Cardiovascular Disease

## 2013-09-30 VITALS — BP 126/82 | HR 69 | Ht 66.0 in | Wt 202.2 lb

## 2013-09-30 DIAGNOSIS — I25810 Atherosclerosis of coronary artery bypass graft(s) without angina pectoris: Secondary | ICD-10-CM

## 2013-09-30 DIAGNOSIS — I10 Essential (primary) hypertension: Secondary | ICD-10-CM

## 2013-09-30 DIAGNOSIS — E785 Hyperlipidemia, unspecified: Secondary | ICD-10-CM

## 2013-09-30 DIAGNOSIS — R0602 Shortness of breath: Secondary | ICD-10-CM

## 2013-09-30 DIAGNOSIS — D72829 Elevated white blood cell count, unspecified: Secondary | ICD-10-CM

## 2013-09-30 DIAGNOSIS — R61 Generalized hyperhidrosis: Secondary | ICD-10-CM

## 2013-09-30 DIAGNOSIS — R079 Chest pain, unspecified: Secondary | ICD-10-CM

## 2013-09-30 DIAGNOSIS — M62838 Other muscle spasm: Secondary | ICD-10-CM

## 2013-09-30 MED ORDER — NITROGLYCERIN 0.4 MG SL SUBL: 0.4000 mg | SUBLINGUAL_TABLET | SUBLINGUAL | Status: DC | PRN

## 2013-09-30 NOTE — Assessment & Plan Note (Signed)
Blood pressure is well controlled on today's visit. No changes made to the medications. 

## 2013-09-30 NOTE — Patient Instructions (Signed)
You are doing well. No medication changes were made.  Please take nitro for high pressures  We will check pressures today  Please call us if you have new issues that need to be addressed before your next appt.  Your physician wants you to follow-up in: 6 months.  You will receive a reminder letter in the mail two months in advance. If you don't receive a letter, please call our office to schedule the follow-up appointment.

## 2013-09-30 NOTE — Assessment & Plan Note (Signed)
We'll check blood work today. Goal LDL less than 70

## 2013-09-30 NOTE — Progress Notes (Signed)
Patient ID: [name], male    DOB: January 26, 1956, 58 y.o.   MRN: [id number]  HPI Comments: 58 year old gentleman with a history of three vessel CAD, bypass surgery, hyperlipidemia and hypertension, smoking history for 23 years who stopped in 2003,  who presents for routine follow up. He reports having leukocytosis from "cancer" though details not available  Recent catheterization at the end of 2014 for anginal symptoms showing patent grafts severe three-vessel disease. Medical management was recommended. In recovery following a cardiac catheterization, he had episodes of sweating/flushing, malaise associated with bradycardia. Initially it was felt that this was a vagal response to pressure being applied to his groin. Later in recovery, he continued to have bradycardia. He was told to hold his metoprolol at the time of discharge.   he reports that previous symptoms of flushing, dizziness, malaise that he was having at home prior to the procedure have now resolved. No significant chest pain symptoms. He does report having an episode of sweating and weakness that seemed to present while moving some boxes. He had general malaise, went into the house and had systolic pressure 155. He rested, symptoms eventually resolved. He did not feel well for the next few days. Also reports having cramping in his chest area described as a pectoral muscle cramp  Total cholesterol in the 130 range, LDL 60 range  EKG shows normal sinus rhythm with rate 69 beats per minute left axis deviation, unable to exclude old anterior MI   Outpatient Encounter Prescriptions as of 09/30/2013  Medication Sig  . amLODipine (NORVASC) 10 MG tablet TAKE 1/2 TO 1 TABLET ONCE DAILY  . aspirin 81 MG EC tablet Take 162 mg by mouth daily.   [name] losartan-hydrochlorothiazide (HYZAAR) 100-25 MG per tablet Take 0.5 tablets by mouth daily.  . nitroGLYCERIN (NITROSTAT) 0.4 MG SL tablet Place 1 tablet (0.4 mg total) under the tongue every 5  (five) minutes as needed.  . pravastatin (PRAVACHOL) 40 MG tablet TAKE 1 TABLET BY MOUTH AT BEDTIME  . sildenafil (VIAGRA) 100 MG tablet Take 1 tablet (100 mg total) by mouth as needed for erectile dysfunction.  . tadalafil (CIALIS) 5 MG tablet Take 1 tablet (5 mg total) by mouth daily as needed for erectile dysfunction.  . VENTOLIN HFA 108 (90 BASE) MCG/ACT inhaler Inhale 2 puffs into the lungs as needed.    Review of Systems  Constitutional: Negative.   HENT: Negative.   Eyes: Negative.   Respiratory: Negative.   Cardiovascular: Negative.   Gastrointestinal: Negative.   Musculoskeletal: Negative.        Mediastinal discomfort with heavy lifting and certain maneuvers.  Skin: Negative.   Neurological: Negative.   Psychiatric/Behavioral: Negative.   All other systems reviewed and are negative.   BP 126/82  Pulse 69  Ht 5\' 6"  (1.676 m)  Wt 202 lb 4 oz (91.74 kg)  BMI 32.66 kg/m2  Physical Exam  Nursing note and vitals reviewed. Constitutional: He is oriented to person, place, and time. He appears well-developed and well-nourished.  HENT:  Head: Normocephalic.  Nose: Nose normal.  Mouth/Throat: Oropharynx is clear and moist.  Eyes: Conjunctivae are normal. Pupils are equal, round, and reactive to light.  Neck: Normal range of motion. Neck supple. No JVD present.  Cardiovascular: Normal rate, regular rhythm, S1 normal, S2 normal, normal heart sounds and intact distal pulses.  Exam reveals no gallop and no friction rub.   No murmur heard. Well-healed mediastinal scar, some tenderness with palpation.  Pulmonary/Chest: Effort  normal and breath sounds normal. No respiratory distress. He has no wheezes. He has no rales. He exhibits no tenderness.  Abdominal: Soft. Bowel sounds are normal. He exhibits no distension. There is no tenderness.  Musculoskeletal: Normal range of motion. He exhibits no edema and no tenderness.  Lymphadenopathy:    He has no cervical adenopathy.   Neurological: He is alert and oriented to person, place, and time. Coordination normal.  Skin: Skin is warm and dry. No rash noted. No erythema.  Psychiatric: He has a normal mood and affect. His behavior is normal. Judgment and thought content normal.      Assessment and Plan

## 2013-09-30 NOTE — Assessment & Plan Note (Signed)
He reports having diaphoresis episodes since his bypass surgery. They have improved by holding metoprolol. Recent event with malaise, severe hypertension. Etiology unclear

## 2013-09-30 NOTE — Assessment & Plan Note (Signed)
No significant chest pain symptoms. Medical management based on recent cardiac cath

## 2013-09-30 NOTE — Assessment & Plan Note (Signed)
Currently with no symptoms of angina. No further workup at this time. Continue current medication regimen. Recent episode of severe hypertension atypical in nature. No further testing at this time. Suggested he call office if he has additional symptoms

## 2013-10-07 ENCOUNTER — Ambulatory Visit (INDEPENDENT_AMBULATORY_CARE_PROVIDER_SITE_OTHER): Payer: 59

## 2013-10-07 ENCOUNTER — Ambulatory Visit: Payer: 59 | Admitting: Cardiovascular Disease

## 2013-10-07 DIAGNOSIS — D72829 Elevated white blood cell count, unspecified: Secondary | ICD-10-CM

## 2013-10-07 DIAGNOSIS — I25810 Atherosclerosis of coronary artery bypass graft(s) without angina pectoris: Secondary | ICD-10-CM

## 2013-10-07 DIAGNOSIS — R0602 Shortness of breath: Secondary | ICD-10-CM

## 2013-10-07 DIAGNOSIS — I10 Essential (primary) hypertension: Secondary | ICD-10-CM

## 2013-10-07 DIAGNOSIS — M62838 Other muscle spasm: Secondary | ICD-10-CM

## 2013-10-14 ENCOUNTER — Ambulatory Visit: Payer: 59 | Admitting: Cardiovascular Disease

## 2013-12-06 ENCOUNTER — Other Ambulatory Visit: Payer: Self-pay | Admitting: Cardiovascular Disease

## 2014-03-31 ENCOUNTER — Ambulatory Visit: Payer: 59 | Admitting: Cardiovascular Disease

## 2014-04-07 ENCOUNTER — Ambulatory Visit (INDEPENDENT_AMBULATORY_CARE_PROVIDER_SITE_OTHER): Payer: 59 | Admitting: Cardiovascular Disease

## 2014-04-07 ENCOUNTER — Encounter: Payer: Self-pay | Admitting: Cardiovascular Disease

## 2014-04-07 VITALS — BP 130/82 | HR 59 | Ht 64.0 in | Wt 205.5 lb

## 2014-04-07 DIAGNOSIS — R079 Chest pain, unspecified: Secondary | ICD-10-CM

## 2014-04-07 DIAGNOSIS — I10 Essential (primary) hypertension: Secondary | ICD-10-CM

## 2014-04-07 DIAGNOSIS — R5383 Other fatigue: Secondary | ICD-10-CM

## 2014-04-07 DIAGNOSIS — I25810 Atherosclerosis of coronary artery bypass graft(s) without angina pectoris: Secondary | ICD-10-CM

## 2014-04-07 DIAGNOSIS — R5381 Other malaise: Secondary | ICD-10-CM

## 2014-04-07 DIAGNOSIS — R61 Generalized hyperhidrosis: Secondary | ICD-10-CM

## 2014-04-07 DIAGNOSIS — E785 Hyperlipidemia, unspecified: Secondary | ICD-10-CM

## 2014-04-07 NOTE — Patient Instructions (Addendum)
You are doing well. No medication changes were made.  We will check lab work today  Please call us if you have new issues that need to be addressed before your next appt.  Your physician wants you to follow-up in: 6 months.  You will receive a reminder letter in the mail two months in advance. If you don't receive a letter, please call our office to schedule the follow-up appointment.

## 2014-04-07 NOTE — Assessment & Plan Note (Signed)
Currently with no symptoms of angina. No further workup at this time. Continue current medication regimen. 

## 2014-04-07 NOTE — Progress Notes (Signed)
Patient ID: [name], male    DOB: [birth year], 58 y.o.   MRN: [id number]  HPI Comments: 58 year old gentleman with a history of three vessel CAD, bypass surgery, hyperlipidemia and hypertension, smoking history for 23 years who stopped in 2003,  who presents for routine follow up. He reports having leukocytosis from "cancer" though details not available Frequent episodes of sweating, malaise/weakness initially felt to be secondary to bradycardia from metoprolol  In followup today, he reports that he has been working long hours. One recent episode 03/28/2014 where he was working with his tools, sitting, started sweating all over. This happened at 9 AM. He is relatively asymptomatic. His work colleagues sat him in a chair and gave him something to drink. He felt weak for 3 days after.  Still reports feeling weak in the summer heat, better in the cold temperatures of the winter No significant chest pain.  catheterization at the end of 2014 for anginal symptoms showing patent grafts severe three-vessel disease. Medical management was recommended. In recovery following a cardiac catheterization, he had episodes of sweating/flushing, malaise associated with bradycardia. Initially it was felt that this was a vagal response to pressure being applied to his groin. Later in recovery, he continued to have bradycardia. He was told to hold his metoprolol at the time of discharge.  flushing, dizziness, malaise that he was having at home prior to the procedure  resolved.  Total cholesterol in the 130 range, LDL 60 range  EKG shows normal sinus rhythm with rate 59 beats per minute left axis deviation, unable to exclude old anterior MI   Outpatient Encounter Prescriptions as of 04/07/2014  Medication Sig  . amLODipine (NORVASC) 10 MG tablet TAKE 1/2 TO 1 TABLET ONCE DAILY  . aspirin 81 MG EC tablet Take 81 mg by mouth daily.   [name] losartan-hydrochlorothiazide (HYZAAR) 100-25 MG per tablet Take 0.5 tablets by  mouth daily.  . nitroGLYCERIN (NITROSTAT) 0.4 MG SL tablet Place 1 tablet (0.4 mg total) under the tongue every 5 (five) minutes as needed.  . pravastatin (PRAVACHOL) 40 MG tablet TAKE 1 TABLET BY MOUTH AT BEDTIME  . VENTOLIN HFA 108 (90 BASE) MCG/ACT inhaler Inhale 2 puffs into the lungs as needed.   [name] VIAGRA 100 MG tablet TAKE 1 TABLET BY MOUTH AS NEEDED FOR ERECTILE DYSFUNCTION   Review of Systems  Constitutional:       Episode of sweating  HENT: Negative.   Eyes: Negative.   Respiratory: Negative.   Cardiovascular: Negative.   Gastrointestinal: Negative.   Endocrine: Negative.   Musculoskeletal: Negative.        Mediastinal discomfort with heavy lifting and certain maneuvers.  Skin: Negative.   Allergic/Immunologic: Negative.   Neurological: Positive for weakness.  Hematological: Negative.   Psychiatric/Behavioral: Negative.   All other systems reviewed and are negative.  BP 130/82  Pulse 59  Ht 5\' 4"  (1.626 m)  Wt 205 lb 8 oz (93.214 kg)  BMI 35.26 kg/m2  Physical Exam  Nursing note and vitals reviewed. Constitutional: He is oriented to person, place, and time. He appears well-developed and well-nourished.  HENT:  Head: Normocephalic.  Nose: Nose normal.  Mouth/Throat: Oropharynx is clear and moist.  Eyes: Conjunctivae are normal. Pupils are equal, round, and reactive to light.  Neck: Normal range of motion. Neck supple. No JVD present.  Cardiovascular: Normal rate, regular rhythm, S1 normal, S2 normal, normal heart sounds and intact distal pulses.  Exam reveals no gallop and no friction rub.  No murmur heard. Well-healed mediastinal scar, some tenderness with palpation.  Pulmonary/Chest: Effort normal and breath sounds normal. No respiratory distress. He has no wheezes. He has no rales. He exhibits no tenderness.  Abdominal: Soft. Bowel sounds are normal. He exhibits no distension. There is no tenderness.  Musculoskeletal: Normal range of motion. He exhibits no  edema and no tenderness.  Lymphadenopathy:    He has no cervical adenopathy.  Neurological: He is alert and oriented to person, place, and time. Coordination normal.  Skin: Skin is warm and dry. No rash noted. No erythema.  Psychiatric: He has a normal mood and affect. His behavior is normal. Judgment and thought content normal.      Assessment and Plan

## 2014-04-07 NOTE — Assessment & Plan Note (Signed)
Blood pressure is well controlled on today's visit. No changes made to the medications. 

## 2014-04-07 NOTE — Assessment & Plan Note (Signed)
Cholesterol is at goal on the current lipid regimen. No changes to the medications were made.  

## 2014-04-07 NOTE — Assessment & Plan Note (Signed)
No recent episodes of chest pain concerning for angina

## 2014-04-07 NOTE — Assessment & Plan Note (Signed)
He continues to have episodes of sweating/diaphoresis followed by weakness. 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 Testosterone injections held secondary to side effects. Suggested he closely monitor his blood pressure and heart rate when he has these episodes. If symptoms progress, become more frequent, we will order a Holter monitor or 30 day monitor to rule out arrhythmia. Currently they are very rare

## 2014-04-08 LAB — TESTOSTERONE: Testosterone: 387 ng/dL (ref 348–1197)

## 2014-04-08 LAB — TSH: TSH: 2.27 u[IU]/mL (ref 0.450–4.500)

## 2014-04-24 ENCOUNTER — Other Ambulatory Visit: Payer: Self-pay | Admitting: Cardiovascular Disease

## 2014-05-23 ENCOUNTER — Telehealth: Payer: Self-pay

## 2014-05-23 DIAGNOSIS — R001 Bradycardia, unspecified: Secondary | ICD-10-CM

## 2014-05-23 DIAGNOSIS — R42 Dizziness and giddiness: Secondary | ICD-10-CM

## 2014-05-23 NOTE — Telephone Encounter (Signed)
Pt wife states there is no improvement in his HR, feels tired, drained, dizzy. HR is staying around 59. Please call.[place] pt cell

## 2014-05-23 NOTE — Telephone Encounter (Signed)
Left message for pt to call back  °

## 2014-05-23 NOTE — Telephone Encounter (Signed)
Would probably recommend a 30 day monitor given his symptoms Also would measure blood pressure when he has episodes Typically heart rate in the 50s alone should not cause symptoms  Monitor may capture even slower rhythm that might be contributing to symptoms

## 2014-05-23 NOTE — Telephone Encounter (Signed)
Pt left message on voicemail that at his last ov on 04/07/14, he was advised to call the office if his sx continued.  He reports that his HR is remaining in the 50s, he had another episode of sweating, he is dizzy and was unable to go to work today.  He would like to know how to proceed.  Please advise.  Thank you.

## 2014-05-24 NOTE — Telephone Encounter (Signed)
Pt presented to office this am thinking he had a 7:45 appt w/ Dr. Rockey Situ.  Spoke w/ pt.  Advised him of Dr. Donivan Scull recommendation of 30 day monitor.   He is agreeable to this.  Pt initially thought he was still taking his metoprolol, but on questioning, he has not taken this in over a year.  Misunderstanding was due to the fact that his wife takes this med.  Pt reports sweating this am.  BP 162/80.   Pt admits that he is upset about taking day off of work and not having an actual appt.  Advised pt to monitor BP at home.  He is agreeable to this.  Pt requests note to be off of work today.  This was provided.  Asked pt to call back w/ any further questions or concerns.

## 2014-05-26 DIAGNOSIS — R42 Dizziness and giddiness: Secondary | ICD-10-CM

## 2014-05-26 DIAGNOSIS — I499 Cardiac arrhythmia, unspecified: Secondary | ICD-10-CM

## 2014-06-06 ENCOUNTER — Telehealth: Payer: Self-pay | Admitting: *Deleted

## 2014-06-06 NOTE — Telephone Encounter (Signed)
Calling back to try and figure when do they get the results of a monitor.

## 2014-06-06 NOTE — Telephone Encounter (Signed)
Patient's wife wants to know how long it take to get the heart monitor results. He is taking the monitor off 06/25/14.

## 2014-06-30 ENCOUNTER — Ambulatory Visit: Payer: 59 | Admitting: Cardiovascular Disease

## 2014-07-04 ENCOUNTER — Ambulatory Visit (INDEPENDENT_AMBULATORY_CARE_PROVIDER_SITE_OTHER): Payer: 59 | Admitting: Cardiovascular Disease

## 2014-07-04 ENCOUNTER — Encounter: Payer: Self-pay | Admitting: Cardiovascular Disease

## 2014-07-04 VITALS — BP 152/80 | HR 68 | Ht 64.0 in | Wt 208.5 lb

## 2014-07-04 DIAGNOSIS — I25810 Atherosclerosis of coronary artery bypass graft(s) without angina pectoris: Secondary | ICD-10-CM

## 2014-07-04 DIAGNOSIS — I10 Essential (primary) hypertension: Secondary | ICD-10-CM

## 2014-07-04 DIAGNOSIS — R61 Generalized hyperhidrosis: Secondary | ICD-10-CM

## 2014-07-04 DIAGNOSIS — R42 Dizziness and giddiness: Secondary | ICD-10-CM

## 2014-07-04 DIAGNOSIS — E785 Hyperlipidemia, unspecified: Secondary | ICD-10-CM

## 2014-07-04 NOTE — Assessment & Plan Note (Signed)
Etiology is not clear, seems to be better in the winter cooler months. 30 day monitor did not show arrhythmia. TSH and testosterone ordered in the past which were normal. No further testing at this time. Suggested he contact us if he has recurrent symptoms

## 2014-07-04 NOTE — Assessment & Plan Note (Signed)
Cholesterol is at goal on the current lipid regimen. No changes to the medications were made.  

## 2014-07-04 NOTE — Patient Instructions (Signed)
You are doing well. No medication changes were made.  Your next appointment will be scheduled in our new office located at :  [address], [place], [place] [address]  Please call us if you have new issues that need to be addressed before your next appt.  Your physician wants you to follow-up in: 6 months.  You will receive a reminder letter in the mail two months in advance. If you don't receive a letter, please call our office to schedule the follow-up appointment.

## 2014-07-04 NOTE — Assessment & Plan Note (Signed)
Stable disease, no unstable angina on today's visit. Chronic mild shortness of breath with heavy exertion. Working long hours with profound fatigue and tired. Uncertain how long he will be able to continue this pace.

## 2014-07-04 NOTE — Progress Notes (Signed)
Patient ID: [name], male    DOB: 06/14/1956, 58 y.o.   MRN: [id number]  HPI Comments: 58 year old gentleman with a history of three vessel CAD, bypass surgery, hyperlipidemia and hypertension, smoking history for 23 years who stopped in 2003,  who presents for routine follow up. He reports having leukocytosis from cancer though details not available.   In the past he has had frequent episodes of sweating, malaise/weakness initially felt to be secondary to bradycardia from metoprolol In follow-up today, he has completed a 30 day monitor in October 2015 given his continued symptoms of sweating, malaise, weakness. Monitor did not show any significant arrhythmia. No significant bradycardia noted His wife feels the cooler weather has helped his symptoms. He was having frequent episodes during the hot summer months He continues to work 10 hours at a time, 4 days per week. There is no opportunity to work shorter days He is really feeling the grind of work in the long hours, having profound fatigue, tired all of the time. No recent episodes of chest pain Chronic mild shortness of breath with heavy exertion.  EKG on today's visit shows normal sinus rhythm with rate 68 beats per minute left axis deviation, old anterior MI, poor R-wave progression to the anterior precordial leads  Other past medical history  episode 03/28/2014 where he was working with his tools, sitting, started sweating all over. This happened at 9 AM. His work colleagues sat him in a chair and gave him something to drink. He felt weak for 3 days  catheterization at the end of 2014 for anginal symptoms showing patent grafts severe three-vessel disease. Medical management was recommended. In recovery following a cardiac catheterization, he had episodes of sweating/flushing, malaise associated with bradycardia. Initially it was felt that this was a vagal response to pressure being applied to his groin. Later in recovery, he continued  to have bradycardia. He was told to hold his metoprolol at the time of discharge.  flushing, dizziness, malaise that he was having at home prior to the procedure  resolved.  Total cholesterol in the 130 range, LDL 60 range     Outpatient Encounter Prescriptions as of 07/04/2014  Medication Sig  . amLODipine (NORVASC) 10 MG tablet TAKE 1/2 TO 1 TABLET ONCE DAILY  . aspirin 81 MG EC tablet Take 81 mg by mouth daily.   [name] losartan-hydrochlorothiazide (HYZAAR) 100-25 MG per tablet TAKE 1/2 TABLET BY MOUTH EVERY DAY  . nitroGLYCERIN (NITROSTAT) 0.4 MG SL tablet Place 1 tablet (0.4 mg total) under the tongue every 5 (five) minutes as needed.  . pravastatin (PRAVACHOL) 40 MG tablet TAKE 1 TABLET BY MOUTH AT BEDTIME  . VENTOLIN HFA 108 (90 BASE) MCG/ACT inhaler Inhale 2 puffs into the lungs as needed.   [name] VIAGRA 100 MG tablet TAKE 1 TABLET BY MOUTH AS NEEDED FOR ERECTILE DYSFUNCTION      Social history  reports that he quit smoking about 12 years ago. His smoking use included Cigarettes. He has a 21 pack-year smoking history. He does not have any smokeless tobacco history on file. He reports that he drinks about 0.5 oz of alcohol per week. He reports that he does not use illicit drugs.  Review of Systems  Constitutional: Positive for fatigue.       Episode of sweating  HENT: Negative.   Respiratory: Negative.   Cardiovascular: Negative.   Endocrine: Positive for heat intolerance.  Musculoskeletal: Negative.        Mediastinal discomfort with heavy  lifting and certain maneuvers.  Neurological: Positive for weakness.  Hematological: Negative.   Psychiatric/Behavioral: Negative.   All other systems reviewed and are negative.  BP 152/80 mmHg  Pulse 68  Ht 5\' 4"  (1.626 m)  Wt 208 lb 8 oz (94.575 kg)  BMI 35.77 kg/m2  Physical Exam  Constitutional: He is oriented to person, place, and time. He appears well-developed and well-nourished.  HENT:  Head: Normocephalic.  Nose: Nose normal.   Mouth/Throat: Oropharynx is clear and moist.  Eyes: Conjunctivae are normal. Pupils are equal, round, and reactive to light.  Neck: Normal range of motion. Neck supple. No JVD present.  Cardiovascular: Normal rate, regular rhythm, S1 normal, S2 normal, normal heart sounds and intact distal pulses.  Exam reveals no gallop and no friction rub.   No murmur heard. Pulmonary/Chest: Effort normal and breath sounds normal. No respiratory distress. He has no wheezes. He has no rales. He exhibits no tenderness.  Abdominal: Soft. Bowel sounds are normal. He exhibits no distension. There is no tenderness.  Musculoskeletal: Normal range of motion. He exhibits no edema or tenderness.  Lymphadenopathy:    He has no cervical adenopathy.  Neurological: He is alert and oriented to person, place, and time. Coordination normal.  Skin: Skin is warm and dry. No rash noted. No erythema.  Psychiatric: He has a normal mood and affect. His behavior is normal. Judgment and thought content normal.      Assessment and Plan   Nursing note and vitals reviewed.

## 2014-07-04 NOTE — Assessment & Plan Note (Signed)
Metoprolol held in the past. Seems to be doing somewhat better

## 2014-07-04 NOTE — Assessment & Plan Note (Signed)
Blood pressure elevated on today's initial visit. Recommended he closely monitor his blood pressure at home. He reports is well controlled

## 2014-07-12 ENCOUNTER — Telehealth: Payer: Self-pay

## 2014-07-12 NOTE — Telephone Encounter (Signed)
Returning your call. °

## 2014-07-12 NOTE — Telephone Encounter (Signed)
Attempted to contact pt regarding event monitor report. "NSR with no significant arrythmia". Left message for pt to call back.

## 2014-07-12 NOTE — Telephone Encounter (Signed)
Reviewed results w/ pt's wife.  She verbalizes understanding and will call back w/ any questions or concerns.

## 2014-07-14 ENCOUNTER — Encounter (INDEPENDENT_AMBULATORY_CARE_PROVIDER_SITE_OTHER): Payer: 59

## 2014-07-14 ENCOUNTER — Other Ambulatory Visit: Payer: Self-pay

## 2014-07-14 DIAGNOSIS — R42 Dizziness and giddiness: Secondary | ICD-10-CM

## 2014-07-14 DIAGNOSIS — R001 Bradycardia, unspecified: Secondary | ICD-10-CM

## 2014-09-11 ENCOUNTER — Other Ambulatory Visit: Payer: Self-pay | Admitting: Cardiovascular Disease

## 2014-09-12 ENCOUNTER — Other Ambulatory Visit: Payer: Self-pay

## 2014-09-12 NOTE — Telephone Encounter (Signed)
This encounter was created in error - please disregard.

## 2014-09-12 NOTE — Telephone Encounter (Signed)
Disregard, pt wife called back, states it has arrived at the pharmacy

## 2014-10-05 ENCOUNTER — Other Ambulatory Visit: Payer: Self-pay | Admitting: Cardiovascular Disease

## 2014-10-30 ENCOUNTER — Telehealth: Payer: Self-pay

## 2014-10-30 NOTE — Telephone Encounter (Signed)
Pt wife called, states that she needs to talk to someone regarding a denial from his holter monitor. Please call.

## 2014-10-31 NOTE — Telephone Encounter (Signed)
Pt is calling giving more details on the letter she received.  Pt stating that in the letter she needs something from [place], she needs note telling them why she needed the monitor. Needs to be sent to [facility] appeals.  Pt wife is worried that if we don't tell her where this bill is coming from that insurance will not pay for the whole thing.  She would like to know if we can look at 05/26/14 and would like to know what we have pt coming in to get. Would it be an office visit.    Cpt [id number]  And claim apt is $75  please advise.

## 2014-10-31 NOTE — Telephone Encounter (Signed)
DJ would you be able to look at this for me please?

## 2014-12-26 ENCOUNTER — Other Ambulatory Visit: Payer: Self-pay | Admitting: Cardiovascular Disease

## 2014-12-28 ENCOUNTER — Telehealth: Payer: Self-pay | Admitting: *Deleted

## 2014-12-28 DIAGNOSIS — E785 Hyperlipidemia, unspecified: Secondary | ICD-10-CM

## 2014-12-28 NOTE — Telephone Encounter (Signed)
Pt's las lipid profile was 10/07/13. He is sched to see you 01/19/15 @ 2:15. Do you want to get lipids before his appt?

## 2014-12-28 NOTE — Telephone Encounter (Signed)
Pt wife calling asking if pt needs to be doing lipid labs when he comes to see [place]. He wants to know so they can change apt time to morning time.  She stated she only wants Dr Donivan Scull nurse to call her back and not anyone else.  Please advise.

## 2014-12-28 NOTE — Telephone Encounter (Signed)
They are welcome to have a liver/lipids before then, fasting so we can talk about it. Or if available, can schedule for AM appt if available

## 2014-12-29 NOTE — Telephone Encounter (Signed)
Left message for pt w/ Dr. Donivan Scull recommendation.  Asked him to call back to sched labs or w/ any questions or concerns.

## 2015-01-04 ENCOUNTER — Other Ambulatory Visit: Payer: Self-pay

## 2015-01-05 ENCOUNTER — Other Ambulatory Visit: Payer: Self-pay

## 2015-01-05 ENCOUNTER — Ambulatory Visit: Payer: Self-pay | Admitting: Cardiovascular Disease

## 2015-01-06 ENCOUNTER — Other Ambulatory Visit: Payer: Self-pay | Admitting: Cardiovascular Disease

## 2015-01-19 ENCOUNTER — Ambulatory Visit (INDEPENDENT_AMBULATORY_CARE_PROVIDER_SITE_OTHER): Payer: 59 | Admitting: Cardiovascular Disease

## 2015-01-19 ENCOUNTER — Telehealth: Payer: Self-pay | Admitting: *Deleted

## 2015-01-19 ENCOUNTER — Encounter: Payer: Self-pay | Admitting: Cardiovascular Disease

## 2015-01-19 VITALS — BP 124/68 | HR 68 | Ht 66.0 in | Wt 207.5 lb

## 2015-01-19 DIAGNOSIS — I10 Essential (primary) hypertension: Secondary | ICD-10-CM

## 2015-01-19 DIAGNOSIS — I25810 Atherosclerosis of coronary artery bypass graft(s) without angina pectoris: Secondary | ICD-10-CM | POA: Diagnosis not present

## 2015-01-19 DIAGNOSIS — R0602 Shortness of breath: Secondary | ICD-10-CM

## 2015-01-19 DIAGNOSIS — E785 Hyperlipidemia, unspecified: Secondary | ICD-10-CM

## 2015-01-19 DIAGNOSIS — R61 Generalized hyperhidrosis: Secondary | ICD-10-CM

## 2015-01-19 NOTE — Telephone Encounter (Signed)
Spoke w/ pt's wife.  She reports that she is very upset about pt's visit today.  She states that pt came in for labs on 01/04/15 and that we "lost them". Explained to her that pt did not show up for that appt, that the appt was No-Showed, the labels for the labs were not printed, the blood was not collected and the we called [facility] during pt's visit today and they have no results for that date.  During pt's visit, he stated that his labs were drawn by a short, blonde haired young lady that was in training; advised him that we do not have anyone here by that description.  Pt's wife states that he came to this office and he called her upset, as we did not have an appt sched for him.  Advised her that is appt was sched on 5/11 and it was already on the calendar.  She insists that pt came to our office on 5/12 (she sent him over while she was getting he hair cut) and that his labs were drawn.  Advised her that I spoke w/ Pandora in Dr. Nathen May office and pt was sched to have CBC drawn in their office on 5/12, but he did not show for this appt; she states that this is not true.  She verbalizes frustration that she is unable to reach me directly when she calls and that the girls up front ask too many questions.  Advised her that the questions are to help them, as I am able to call them back w/ a reply rather than another question. She states that she is upset that there is a problem w/ her bill that we don't have anyone on the premises to assist her.  She states that she was given a number to call and that she still does not have an answer.  She states that pt is considering leaving the practice and that if they do, they would like to know how to go about getting records. Advised her that I will make our office manager aware and have her call her on Tuesday if possible. She is appreciative and states that she loves the office, particularly Dr. Rockey Situ, Ivin Booty and myself, but we have "lost too many appts  since you moved to that new office".

## 2015-01-19 NOTE — Assessment & Plan Note (Signed)
Currently with no symptoms of angina. No further workup at this time. Continue current medication regimen. 

## 2015-01-19 NOTE — Assessment & Plan Note (Signed)
Most recent labs not available. He reports that he presented to our office for labs but there is no record of this. Order placed for repeat liver and lipid

## 2015-01-19 NOTE — Telephone Encounter (Signed)
Patient's wife called (very very angry) wants to speak to [facility]. Wouldn't tell me what she needed only that it was "personal" and needed to speak with [facility]!!!!

## 2015-01-19 NOTE — Assessment & Plan Note (Signed)
Blood pressure is well controlled on today's visit. No changes made to the medications. 

## 2015-01-19 NOTE — Patient Instructions (Signed)
You are doing well. No medication changes were made.  Please call us if you have new issues that need to be addressed before your next appt.  Your physician wants you to follow-up in: 6 months.  You will receive a reminder letter in the mail two months in advance. If you don't receive a letter, please call our office to schedule the follow-up appointment.   

## 2015-01-19 NOTE — Assessment & Plan Note (Signed)
Previous symptoms, not discussed on today's visit. Etiology unclear, also from bradycardia. Improved after beta blocker held

## 2015-01-19 NOTE — Progress Notes (Signed)
Patient ID: [name], male    DOB: Jul 22, 1956, 59 y.o.   MRN: [id number]  HPI Comments: 59 year old gentleman with a history of three vessel CAD, bypass surgery, hyperlipidemia and hypertension, smoking history for 23 years who stopped in 2003,  who presents for routine follow up of his coronary artery disease. He reports having leukocytosis from cancer though details not available.   In follow-up today, he reports that he is feeling well. He is at work, works 10 hours per day, 4 days per week. He needs to work 3 more years before he can retire. He thinks he will be a would to do this. No significant chest pain symptoms discussed today  EKG on today's visit shows normal sinus rhythm with rate 68 bpm, old anterior MI, left axis deviation  Other past medical history In the past he had frequent episodes of sweating, malaise/weakness initially felt to be secondary to bradycardia from metoprolol  previous 30 day monitor in October 2015 given his continued symptoms of sweating, malaise, weakness. Monitor did not show any significant arrhythmia. No significant bradycardia noted   episode 03/28/2014 where he was working with his tools, sitting, started sweating all over. This happened at 9 AM. His work colleagues sat him in a chair and gave him something to drink. He felt weak for 3 days  catheterization at the end of 2014 for anginal symptoms showing patent grafts severe three-vessel disease. Medical management was recommended.  In recovery following a cardiac catheterization, he had episodes of sweating/flushing, malaise associated with bradycardia. Initially it was felt that this was a vagal response to pressure being applied to his groin. Later in recovery, he continued to have bradycardia. He was told to hold his metoprolol at the time of discharge.  flushing, dizziness, malaise that he was having at home prior to the procedure  resolved.  Total cholesterol in the 130 range, LDL 60  range    Allergies  Allergen Reactions  . Codeine     Breaks out into a sweat & rash  . Heparin     Respiratory failure  . Penicillins     Rash     Current Outpatient Prescriptions on File Prior to Visit  Medication Sig Dispense Refill  . amLODipine (NORVASC) 10 MG tablet TAKE 1/2-1 TABLET BY MOUTH EVERY DAY 30 tablet 3  . aspirin 81 MG EC tablet Take 81 mg by mouth daily.     [name] losartan-hydrochlorothiazide (HYZAAR) 100-25 MG per tablet TAKE 1/2 TABLET BY MOUTH EVERY DAY 30 tablet 6  . nitroGLYCERIN (NITROSTAT) 0.4 MG SL tablet Place 1 tablet (0.4 mg total) under the tongue every 5 (five) minutes as needed. 25 tablet 6  . pravastatin (PRAVACHOL) 40 MG tablet TAKE 1 TABLET BY MOUTH EVERY DAY AT BEDTIME 30 tablet 6  . VENTOLIN HFA 108 (90 BASE) MCG/ACT inhaler Inhale 2 puffs into the lungs as needed.     [name] VIAGRA 100 MG tablet TAKE 1 TABLET BY MOUTH AS NEEDED FOR ERECTILE DYSFUNCTION 10 tablet 2   No current facility-administered medications on file prior to visit.    Past Medical History  Diagnosis Date  . CAD (coronary artery disease)   . Chronic fatigue   . HTN (hypertension)   . HLD (hyperlipidemia)   . Leukocytosis     Past Surgical History  Procedure Laterality Date  . Coronary artery bypass graft    . Neck surgery      x2  . Hernia repair    .  Appendectomy      Social History  reports that he quit smoking about 12 years ago. His smoking use included Cigarettes. He has a 21 pack-year smoking history. He does not have any smokeless tobacco history on file. He reports that he drinks about 0.5 oz of alcohol per week. He reports that he does not use illicit drugs.  Family History family history includes Heart attack (age of onset: 31) in his father; Heart attack (age of onset: 27) in his mother.  Review of Systems  Constitutional: Negative.        Episode of sweating  Respiratory: Negative.   Cardiovascular: Negative.   Musculoskeletal: Negative.         Mediastinal discomfort with heavy lifting and certain maneuvers.  Neurological: Negative.   Hematological: Negative.   Psychiatric/Behavioral: Negative.   All other systems reviewed and are negative.  BP 124/68 mmHg  Pulse 68  Ht 5\' 6"  (1.676 m)  Wt 207 lb 8 oz (94.121 kg)  BMI 33.51 kg/m2  Physical Exam  Constitutional: He is oriented to person, place, and time. He appears well-developed and well-nourished.  HENT:  Head: Normocephalic.  Nose: Nose normal.  Mouth/Throat: Oropharynx is clear and moist.  Eyes: Conjunctivae are normal. Pupils are equal, round, and reactive to light.  Neck: Normal range of motion. Neck supple. No JVD present.  Cardiovascular: Normal rate, regular rhythm, S1 normal, S2 normal, normal heart sounds and intact distal pulses.  Exam reveals no gallop and no friction rub.   No murmur heard. Pulmonary/Chest: Effort normal and breath sounds normal. No respiratory distress. He has no wheezes. He has no rales. He exhibits no tenderness.  Abdominal: Soft. Bowel sounds are normal. He exhibits no distension. There is no tenderness.  Musculoskeletal: Normal range of motion. He exhibits no edema or tenderness.  Lymphadenopathy:    He has no cervical adenopathy.  Neurological: He is alert and oriented to person, place, and time. Coordination normal.  Skin: Skin is warm and dry. No rash noted. No erythema.  Psychiatric: He has a normal mood and affect. His behavior is normal. Judgment and thought content normal.      Assessment and Plan   Nursing note and vitals reviewed.

## 2015-01-26 ENCOUNTER — Other Ambulatory Visit: Payer: Self-pay | Admitting: Cardiovascular Disease

## 2015-01-26 NOTE — Telephone Encounter (Signed)
Please review for refill. Thanks!  

## 2015-01-30 ENCOUNTER — Telehealth: Payer: Self-pay | Admitting: *Deleted

## 2015-01-30 NOTE — Telephone Encounter (Signed)
Pt wife calling stating they have tried to get refill to be sent to [facility] [place]  Tried twice and could not have not gotten a response  Pt wife also has a question but would not tell me what it was about, states it is about the office visit from the other day. Please call.

## 2015-01-31 NOTE — Telephone Encounter (Signed)
Pt wife called wanting out office to send in Rx refill for Viagra.  Instructed her that we need orders from the MD to get this medication ordered for him and Dr. Rockey Situ is out of the office until next week.  Told her that message would be sent to Dr. Rockey Situ for review and it will be addresses next week at the earliest  She expressed understanding and no additional questions at this time.

## 2015-01-31 NOTE — Telephone Encounter (Signed)
Wife returning call about Refill.

## 2015-02-06 NOTE — Telephone Encounter (Signed)
Pt wife calling a little upset for she is needing this to be refilled by [place]. Spoke to a nurse she said  And understood Dr Rockey Situ was out last week but "it is Tuesday and just wants it to be sent in" Please advise

## 2015-05-14 ENCOUNTER — Other Ambulatory Visit: Payer: Self-pay | Admitting: Cardiovascular Disease

## 2015-06-12 ENCOUNTER — Other Ambulatory Visit: Payer: Self-pay | Admitting: Cardiovascular Disease

## 2015-07-12 ENCOUNTER — Encounter: Payer: Self-pay | Admitting: Cardiovascular Disease

## 2015-07-12 ENCOUNTER — Ambulatory Visit (INDEPENDENT_AMBULATORY_CARE_PROVIDER_SITE_OTHER): Payer: Commercial Managed Care - HMO | Admitting: Cardiovascular Disease

## 2015-07-12 VITALS — BP 130/62 | HR 64 | Ht 66.0 in | Wt 204.5 lb

## 2015-07-12 DIAGNOSIS — N529 Male erectile dysfunction, unspecified: Secondary | ICD-10-CM | POA: Diagnosis not present

## 2015-07-12 DIAGNOSIS — I10 Essential (primary) hypertension: Secondary | ICD-10-CM

## 2015-07-12 DIAGNOSIS — R61 Generalized hyperhidrosis: Secondary | ICD-10-CM

## 2015-07-12 DIAGNOSIS — E785 Hyperlipidemia, unspecified: Secondary | ICD-10-CM | POA: Diagnosis not present

## 2015-07-12 DIAGNOSIS — I25810 Atherosclerosis of coronary artery bypass graft(s) without angina pectoris: Secondary | ICD-10-CM

## 2015-07-12 NOTE — Patient Instructions (Signed)
You are doing well. No medication changes were made.  Try the cialis 10 up to 20 mg at a time  Try Co Enz Q10 for cramping  Please call us if you have new issues that need to be addressed before your next appt.  Your physician wants you to follow-up in: 6 months.  You will receive a reminder letter in the mail two months in advance. If you don't receive a letter, please call our office to schedule the follow-up appointment.

## 2015-07-12 NOTE — Assessment & Plan Note (Signed)
Blood pressure is well controlled on today's visit. No changes made to the medications. 

## 2015-07-12 NOTE — Progress Notes (Signed)
Patient ID: [name], male    DOB: March 17, 1956, 59 y.o.   MRN: [id number]  HPI Comments: 59 year old gentleman with a history of three vessel CAD, bypass surgery, hyperlipidemia and hypertension, smoking history for 23 years who stopped in 2003,  who presents for routine follow up of his coronary artery disease.  leukocytosis from cancer  , followed by oncology   he reports that he is feeling well. He is at work, works 10 hours per day, 4 days per week. No significant chest pain symptoms discussed today  Most concerned about erectile dysfunction symptoms  He reports that he has tried Viagra, has not tried other medications  He is not on testosterone supplement as he was previously.  Reports that oncology does not want his testosterone high  Has not talked about this with urology or primary care. No recent lab work available including testosterone level or prolactin   He does report some cramping in his legs , unclear if this is from pravastatin  EKG on today's visit shows normal sinus rhythm with rate 63 bpm,  Unable to exclude old inferior MI, anterior MI  Review of lipid panel with him today shows total cholesterol 150, LDL 75  Other past medical history In the past he had frequent episodes of sweating, malaise/weakness initially felt to be secondary to bradycardia from metoprolol  previous 30 day monitor in October 2015 given his continued symptoms of sweating, malaise, weakness. Monitor did not show any significant arrhythmia. No significant bradycardia noted   episode 03/28/2014 where he was working with his tools, sitting, started sweating all over. This happened at 9 AM. His work colleagues sat him in a chair and gave him something to drink. He felt weak for 3 days  catheterization at the end of 2014 for anginal symptoms showing patent grafts severe three-vessel disease. Medical management was recommended.  In recovery following a cardiac catheterization, he had episodes of  sweating/flushing, malaise associated with bradycardia. Initially it was felt that this was a vagal response to pressure being applied to his groin. Later in recovery, he continued to have bradycardia. He was told to hold his metoprolol at the time of discharge.  flushing, dizziness, malaise that he was having at home prior to the procedure  resolved.     Allergies  Allergen Reactions  . Codeine     Breaks out into a sweat & rash  . Heparin     Respiratory failure  . Penicillins     Rash     Current Outpatient Prescriptions on File Prior to Visit  Medication Sig Dispense Refill  . amLODipine (NORVASC) 10 MG tablet TAKE 1/2-1 TABLET BY MOUTH EVERY DAY 30 tablet 3  . aspirin 81 MG EC tablet Take 81 mg by mouth daily.     [name] losartan-hydrochlorothiazide (HYZAAR) 100-25 MG per tablet TAKE 1/2 TABLET BY MOUTH EVERY DAY 30 tablet 6  . nitroGLYCERIN (NITROSTAT) 0.4 MG SL tablet Place 1 tablet (0.4 mg total) under the tongue every 5 (five) minutes as needed. 25 tablet 6  . pravastatin (PRAVACHOL) 40 MG tablet TAKE 1 TABLET BY MOUTH AT BEDTIME 30 tablet 3  . VENTOLIN HFA 108 (90 BASE) MCG/ACT inhaler Inhale 2 puffs into the lungs as needed.     [name] VIAGRA 100 MG tablet TAKE 1 TABLET BY MOUTH AS NEEDED FOR ERECTILE DYSFUNCTION 7 tablet 3   No current facility-administered medications on file prior to visit.    Past Medical History  Diagnosis Date  .  CAD (coronary artery disease)   . Chronic fatigue   . HTN (hypertension)   . HLD (hyperlipidemia)   . Leukocytosis     Past Surgical History  Procedure Laterality Date  . Coronary artery bypass graft    . Neck surgery      x2  . Hernia repair    . Appendectomy      Social History  reports that he quit smoking about 13 years ago. His smoking use included Cigarettes. He has a 21 pack-year smoking history. He does not have any smokeless tobacco history on file. He reports that he drinks about 0.5 oz of alcohol per week. He reports that he  does not use illicit drugs.  Family History family history includes Heart attack (age of onset: 41) in his father; Heart attack (age of onset: 45) in his mother.  Review of Systems  Constitutional: Negative.   Respiratory: Negative.   Cardiovascular: Negative.   Genitourinary:       Erectile dysfunction  Musculoskeletal: Negative.   Neurological: Negative.   Hematological: Negative.   Psychiatric/Behavioral: Negative.   All other systems reviewed and are negative.  BP 130/62 mmHg  Pulse 64  Ht 5\' 6"  (1.676 m)  Wt 204 lb 8 oz (92.761 kg)  BMI 33.02 kg/m2  Physical Exam  Constitutional: He is oriented to person, place, and time. He appears well-developed and well-nourished.  HENT:  Head: Normocephalic.  Nose: Nose normal.  Mouth/Throat: Oropharynx is clear and moist.  Eyes: Conjunctivae are normal. Pupils are equal, round, and reactive to light.  Neck: Normal range of motion. Neck supple. No JVD present.  Cardiovascular: Normal rate, regular rhythm, S1 normal, S2 normal, normal heart sounds and intact distal pulses.  Exam reveals no gallop and no friction rub.   No murmur heard. Pulmonary/Chest: Effort normal and breath sounds normal. No respiratory distress. He has no wheezes. He has no rales. He exhibits no tenderness.  Abdominal: Soft. Bowel sounds are normal. He exhibits no distension. There is no tenderness.  Musculoskeletal: Normal range of motion. He exhibits no edema or tenderness.  Lymphadenopathy:    He has no cervical adenopathy.  Neurological: He is alert and oriented to person, place, and time. Coordination normal.  Skin: Skin is warm and dry. No rash noted. No erythema.  Psychiatric: He has a normal mood and affect. His behavior is normal. Judgment and thought content normal.      Assessment and Plan   Nursing note and vitals reviewed.

## 2015-07-12 NOTE — Assessment & Plan Note (Signed)
Cholesterol is at goal on the current lipid regimen. No changes to the medications were made. Reassured the patient's wife that there is no significant long-term complication, benefit outweighs the risk He does have leg cramping, could try either alternate statin or coenzyme Q 10

## 2015-07-12 NOTE — Assessment & Plan Note (Signed)
Recommended he try Cialis If no improvement in his symptoms, would suggest a referral to urology for alternative measures/workup

## 2015-07-12 NOTE — Assessment & Plan Note (Signed)
Currently with no symptoms of angina. No further workup at this time. Continue current medication regimen. 

## 2015-07-12 NOTE — Assessment & Plan Note (Signed)
No further episodes of sweating/diaphoresis. No further workup needed Possibly associated with bradycardia in the past Beta blockers held with improvement of his symptoms

## 2015-09-24 ENCOUNTER — Other Ambulatory Visit: Payer: Self-pay | Admitting: Cardiovascular Disease

## 2015-12-27 ENCOUNTER — Encounter (HOSPITAL_COMMUNITY): Payer: Self-pay | Admitting: Emergency Medicine

## 2015-12-27 ENCOUNTER — Emergency Department (HOSPITAL_COMMUNITY): Payer: Commercial Managed Care - HMO

## 2015-12-27 ENCOUNTER — Emergency Department (HOSPITAL_COMMUNITY)
Admission: EM | Admit: 2015-12-27 | Discharge: 2015-12-27 | Disposition: A | Discharge status: Home / Self Care | Payer: Commercial Managed Care - HMO | Attending: Emergency Medicine | Admitting: Emergency Medicine

## 2015-12-27 DIAGNOSIS — Z7982 Long term (current) use of aspirin: Secondary | ICD-10-CM | POA: Insufficient documentation

## 2015-12-27 DIAGNOSIS — Z880 Allergy status to penicillin: Secondary | ICD-10-CM | POA: Diagnosis not present

## 2015-12-27 DIAGNOSIS — R61 Generalized hyperhidrosis: Secondary | ICD-10-CM | POA: Insufficient documentation

## 2015-12-27 DIAGNOSIS — Z951 Presence of aortocoronary bypass graft: Secondary | ICD-10-CM | POA: Diagnosis not present

## 2015-12-27 DIAGNOSIS — Z79899 Other long term (current) drug therapy: Secondary | ICD-10-CM | POA: Diagnosis not present

## 2015-12-27 DIAGNOSIS — I2510 Atherosclerotic heart disease of native coronary artery without angina pectoris: Secondary | ICD-10-CM | POA: Insufficient documentation

## 2015-12-27 DIAGNOSIS — I10 Essential (primary) hypertension: Secondary | ICD-10-CM | POA: Insufficient documentation

## 2015-12-27 DIAGNOSIS — R079 Chest pain, unspecified: Secondary | ICD-10-CM | POA: Diagnosis present

## 2015-12-27 DIAGNOSIS — E785 Hyperlipidemia, unspecified: Secondary | ICD-10-CM | POA: Insufficient documentation

## 2015-12-27 DIAGNOSIS — R531 Weakness: Secondary | ICD-10-CM | POA: Diagnosis not present

## 2015-12-27 DIAGNOSIS — R112 Nausea with vomiting, unspecified: Secondary | ICD-10-CM | POA: Insufficient documentation

## 2015-12-27 DIAGNOSIS — Z87891 Personal history of nicotine dependence: Secondary | ICD-10-CM | POA: Diagnosis not present

## 2015-12-27 DIAGNOSIS — R630 Anorexia: Secondary | ICD-10-CM | POA: Insufficient documentation

## 2015-12-27 DIAGNOSIS — J40 Bronchitis, not specified as acute or chronic: Secondary | ICD-10-CM

## 2015-12-27 DIAGNOSIS — Z862 Personal history of diseases of the blood and blood-forming organs and certain disorders involving the immune mechanism: Secondary | ICD-10-CM | POA: Insufficient documentation

## 2015-12-27 LAB — BASIC METABOLIC PANEL
Anion gap: 13 (ref 5–15)
BUN: 7 mg/dL (ref 6–20)
CO2: 24 mmol/L (ref 22–32)
Calcium: 8.9 mg/dL (ref 8.9–10.3)
Chloride: 95 mmol/L — ABNORMAL LOW (ref 101–111)
Creatinine, Ser: 0.88 mg/dL (ref 0.61–1.24)
GFR calc Af Amer: >60 mL/min (ref >60)
GFR calc non Af Amer: >60 mL/min (ref >60)
Glucose, Bld: 104 mg/dL — ABNORMAL HIGH (ref 65–99)
Potassium: 3 mmol/L — ABNORMAL LOW (ref 3.5–5.1)
Sodium: 132 mmol/L — ABNORMAL LOW (ref 135–145)

## 2015-12-27 LAB — CBC
HCT: 41 % (ref 39.0–52.0)
Hemoglobin: 14.1 g/dL (ref 13.0–17.0)
MCH: 30.3 pg (ref 26.0–34.0)
MCHC: 34.4 g/dL (ref 30.0–36.0)
MCV: 88 fL (ref 78.0–100.0)
Platelets: 245 10*3/uL (ref 150–400)
RBC: 4.66 MIL/uL (ref 4.22–5.81)
RDW: 12.1 % (ref 11.5–15.5)
WBC: 14.3 10*3/uL — ABNORMAL HIGH (ref 4.0–10.5)

## 2015-12-27 LAB — I-STAT TROPONIN, ED
Troponin i, poc: 0.02 ng/mL (ref 0.00–0.08)
Troponin i, poc: 0.01 ng/mL (ref 0.00–0.08)

## 2015-12-27 LAB — I-STAT CG4 LACTIC ACID, ED
Lactic Acid, Venous: 1.1 mmol/L (ref 0.5–2.0)
Lactic Acid, Venous: 1.64 mmol/L (ref 0.5–2.0)

## 2015-12-27 MED ORDER — ONDANSETRON HCL 4 MG/2ML IJ SOLN
4.0000 mg | Freq: Once | INTRAMUSCULAR | Status: AC
  Administered 2015-12-27: 4 mg via INTRAVENOUS
  Filled 2015-12-27: qty 2

## 2015-12-27 MED ORDER — POTASSIUM CHLORIDE CRYS ER 20 MEQ PO TBCR
40.0000 meq | EXTENDED_RELEASE_TABLET | Freq: Once | ORAL | Status: AC
  Administered 2015-12-27: 40 meq via ORAL
  Filled 2015-12-27: qty 2

## 2015-12-27 MED ORDER — MORPHINE SULFATE (PF) 4 MG/ML IV SOLN
4.0000 mg | Freq: Once | INTRAVENOUS | Status: AC
  Administered 2015-12-27: 4 mg via INTRAVENOUS
  Filled 2015-12-27: qty 1

## 2015-12-27 MED ORDER — ALBUTEROL SULFATE HFA 108 (90 BASE) MCG/ACT IN AERS
2.0000 | INHALATION_SPRAY | Freq: Once | RESPIRATORY_TRACT | Status: AC
  Administered 2015-12-27: 2 via RESPIRATORY_TRACT
  Filled 2015-12-27: qty 6.7

## 2015-12-27 MED ORDER — SODIUM CHLORIDE 0.9 % IV BOLUS (SEPSIS)
500.0000 mL | Freq: Once | INTRAVENOUS | Status: AC
  Administered 2015-12-27: 500 mL via INTRAVENOUS

## 2015-12-27 MED ORDER — LEVOFLOXACIN 750 MG PO TABS
750.0000 mg | ORAL_TABLET | Freq: Once | ORAL | Status: AC
  Administered 2015-12-27: 750 mg via ORAL
  Filled 2015-12-27: qty 1

## 2015-12-27 MED ORDER — IPRATROPIUM-ALBUTEROL 0.5-2.5 (3) MG/3ML IN SOLN
3.0000 mL | Freq: Once | RESPIRATORY_TRACT | Status: AC
  Administered 2015-12-27: 3 mL via RESPIRATORY_TRACT
  Filled 2015-12-27: qty 3

## 2015-12-27 MED ORDER — PROMETHAZINE HCL 25 MG PO TABS: 25.0000 mg | ORAL_TABLET | Freq: Four times a day (QID) | ORAL | Status: DC | PRN

## 2015-12-27 NOTE — ED Provider Notes (Signed)
CSN: [id number]     Arrival date & time 12/27/15  1428 History  By signing my name below, I, [name], attest that this documentation has been prepared under the direction and in the presence of Letroy Vazguez, PA-C. Electronically Signed: Irene Fry, ED Scribe. 12/27/2015. 3:52 PM.   Chief Complaint  Patient presents with  . Chest Pain  . Pneumonia   The history is provided by the patient. No language interpreter was used.  HPI Comments: [name] is a 60 y.o. male with a hx of CAD, HTN, CABG in 2003, and chronic leukocytosis who presents to the Emergency Department complaining of gradually worsening anterior chest pressure and tightness onset 3 days ago. Pt reports associated fever tmax 102.4 F, SOB, generalized weakness, decreased appetite, dry cough, nausea, and vomiting x2. Pt reports worsening SOB with activity. Pt was seen at a [facility], diagnosed with pneumonia and started on Levaquin, of which he has had two doses. The vomiting began after starting the Levaquin, thinks it may be a big pill to take while not eating much.  He has tried Tylenol for pain with mild relief. He denies injuries to the chest, nasal congestion, sore throat, rhinorrhea, sore throat, or new swelling in the legs.   Past Medical History  Diagnosis Date  . CAD (coronary artery disease)   . Chronic fatigue   . HTN (hypertension)   . HLD (hyperlipidemia)   . Leukocytosis    Past Surgical History  Procedure Laterality Date  . Coronary artery bypass graft    . Neck surgery      x2  . Hernia repair    . Appendectomy     Family History  Problem Relation Age of Onset  . Heart attack Mother 80  . Heart attack Father 19   Social History  Substance Use Topics  . Smoking status: Former Smoker -- 1.00 packs/day for 21 years    Types: Cigarettes    Quit date: 03/20/2002  . Smokeless tobacco: None     Comment: Tobacco use - no  . Alcohol Use: 0.5 oz/week    1 Standard drinks or equivalent  per week     Comment: daily    Review of Systems  Constitutional: Positive for fever, chills, diaphoresis, appetite change and fatigue.  HENT: Negative for congestion, rhinorrhea, sore throat and trouble swallowing.   Respiratory: Positive for cough and shortness of breath.   Cardiovascular: Positive for chest pain.  Gastrointestinal: Positive for nausea and vomiting. Negative for abdominal pain.  Musculoskeletal: Negative for neck stiffness.  Skin: Negative for rash.  Allergic/Immunologic: Negative for immunocompromised state.  Psychiatric/Behavioral: Negative for self-injury.  All other systems reviewed and are negative.  Allergies  Codeine; Heparin; and Penicillins  Home Medications   Prior to Admission medications   Medication Sig Start Date End Date Taking? Authorizing Provider  amLODipine (NORVASC) 10 MG tablet TAKE 1/2-1 TABLET BY MOUTH EVERY DAY 06/13/15   Minna Merritts, MD  aspirin 81 MG EC tablet Take 81 mg by mouth daily.  03/21/11   Minna Merritts, MD  losartan-hydrochlorothiazide (HYZAAR) 100-25 MG per tablet TAKE 1/2 TABLET BY MOUTH EVERY DAY 12/26/14   Minna Merritts, MD  nitroGLYCERIN (NITROSTAT) 0.4 MG SL tablet Place 1 tablet (0.4 mg total) under the tongue every 5 (five) minutes as needed. 09/30/13   Minna Merritts, MD  pravastatin (PRAVACHOL) 40 MG tablet TAKE 1 TABLET BY MOUTH AT BEDTIME 09/24/15   Minna Merritts,  MD  VENTOLIN HFA 108 (90 BASE) MCG/ACT inhaler Inhale 2 puffs into the lungs as needed.  03/18/13   Historical Provider, MD  VIAGRA 100 MG tablet TAKE 1 TABLET BY MOUTH AS NEEDED FOR ERECTILE DYSFUNCTION 02/06/15   Minna Merritts, MD   BP 96/66 mmHg  Pulse 91  Temp(Src) 99.5 F (37.5 C) (Oral)  Resp 20  Ht 5\' 5"  (1.651 m)  Wt 201 lb (91.173 kg)  BMI 33.45 kg/m2  SpO2 95% Physical Exam  Constitutional: He appears well-developed and well-nourished. No distress.  HENT:  Head: Normocephalic and atraumatic.  Mouth/Throat: Oropharynx is clear  and moist. No oropharyngeal exudate.  Eyes: Conjunctivae and EOM are normal. Right eye exhibits no discharge. Left eye exhibits no discharge.  Neck: Normal range of motion. Neck supple.  Cardiovascular: Normal rate and regular rhythm.   Pulmonary/Chest: Effort normal and breath sounds normal. No stridor. Tachypnea noted. No respiratory distress. He has no wheezes. He has no rales. He exhibits tenderness.  Left chest tenderness  Abdominal: Soft. He exhibits no distension and no mass. There is no tenderness. There is no rebound and no guarding.  Musculoskeletal: He exhibits no edema.  Bilateral calves nontender, no edema.  Lymphadenopathy:    He has no cervical adenopathy.  Neurological: He is alert. He exhibits normal muscle tone.  Skin: He is not diaphoretic.  Psychiatric: He has a normal mood and affect. His behavior is normal.  Nursing note and vitals reviewed.   ED Course  Procedures (including critical care time) DIAGNOSTIC STUDIES: Oxygen Saturation is 95% on RA, adequate by my interpretation.    COORDINATION OF CARE: 3:48 PM-Discussed treatment plan which includes chest x-ray, labs, and breathing treatments with pt at bedside and pt agreed to plan.    Labs Review Labs Reviewed  BASIC METABOLIC PANEL - Abnormal; Notable for the following:    Sodium 132 (*)    Potassium 3.0 (*)    Chloride 95 (*)    Glucose, Bld 104 (*)    All other components within normal limits  CBC - Abnormal; Notable for the following:    WBC 14.3 (*)    All other components within normal limits  I-STAT TROPOININ, ED  I-STAT CG4 LACTIC ACID, ED  I-STAT CG4 LACTIC ACID, ED  Randolm Idol, ED    Imaging Review Dg Chest 2 View  12/27/2015  CLINICAL DATA:  Chest pain for 2 days EXAM: CHEST  2 VIEW COMPARISON:  03/12/2011 FINDINGS: Cardiac shadow is within normal limits. Postsurgical changes are again seen. The lungs are clear bilaterally. Mild scarring is noted in the left lung base. No focal bony  abnormality is noted. IMPRESSION: No acute abnormality seen. Electronically Signed   By: Inez Catalina [name]   On: 12/27/2015 15:09   I have personally reviewed and evaluated these images and lab results as part of my medical decision-making.  ED ECG REPORT   Date: 12/27/2015  Rate: 86  Rhythm: normal sinus rhythm and premature ventricular contractions (PVC)  QRS Axis: normal  Intervals: normal  ST/T Wave abnormalities: nonspecific ST/T changes  Conduction Disutrbances:none  Narrative Interpretation:   Old EKG Reviewed: none available  I have personally reviewed the EKG tracing and agree with the computerized printout as noted.    EKG Interpretation   Date/Time:  Thursday Dec 27 2015 17:44:41 EDT Ventricular Rate:  41 PR Interval:  144 QRS Duration: 109 QT Interval:  447 QTC Calculation: 369 R Axis:   41 Text Interpretation:  Junctional rhythm Anterior infarct, old Minimal ST  depression, inferior leads Baseline wander in lead(s) V1 rate slower  compared to earlier Confirmed by GOLDSTON MD, SCOTT [phone]) on 12/27/2015  6:14:44 PM       6:46 PM I spoke with patient following his second nebulizer treatment.  He states he feels "100% better."  His chest pain is relieved but he still feels tightness.  His O2 sat on room air is in the low 90s.  He states he does not feel he will be able to walk very far at home but declines admission, states he usually cannot walk far when he is sick.  Has been told he may have COPD but has not been formally diagnosed.  Will repeat troponin and ambulate with pulsox.  Discussed this with Dr Regenia Skeeter who agrees with plan.    Pt ambulated with pulsox.  Maintained O2 90-92% on room air.  HR increased to 108-110.  Pt reports he felt fine while ambulating.    MDM   Final diagnoses:  Bronchitis    Febrile patient with 4 days of chest tightness, SOB, cough, fevers, sweats, chills.  Was seen at Urgent care two days ago and diagnosed with pneumonia, on  Levaquin.  CXR today shows no pneumonia.  Great improvement with nebs.  Pt is former smoker and has not been diagnosed with COPD but has been told by PCP he may have this.  I suspect that he does and will refer him to pulmonology.  He has chronic leukocytosis and has been told by his hematologist to never take steroids and has refused steroids today.  I did offer him admission but he improved so dramatically and wanted to go home that I think it is fine for him to d/c home with close follow up.  Discussed strict return precautions.  Troponin x 2 negative.  Chest tightness completely alleviated with neb treatments x 2.  Labs significant for leukocytosis (chronic, slightly more elevated than baseline 11-12), hypokalemia (repleted).  Pt did have a vagal episode after a blood draw became pale, diaphoretic, lightheaded, HR 40s, blood pressure systolic 0000000, improved with trendelenburg.  Dr Regenia Skeeter involved at that time and new EKG printed at that time.  Pt improved quickly.  Stated this has happened to him previously with blood draws and IV placement. Pt to be d/c home.  Pt to continue Levaquin, I will add phenergan for nausea and advised to try to eat a meal when taking the medication.  Discussed pt with Dr Regenia Skeeter. Discussed result, findings, treatment, and follow up  with patient.  Pt given return precautions.  Pt verbalizes understanding and agrees with plan.      I doubt any other EMC precluding discharge at this time including, but not necessarily limited to the following: pulmonary embolism, ACS  I personally performed the services described in this documentation, which was scribed in my presence. The recorded information has been reviewed and is accurate.    Clayton Bibles, PA-C 12/27/15 [place], MD 12/28/15 [phone]

## 2015-12-27 NOTE — ED Notes (Signed)
Ambulated patient in the hallway... Patient O2 maintain at 90-92 and his pulse at 108-111.[name] Patient stated that it felt good to be up walking around.Marland KitchenMarland Kitchen

## 2015-12-27 NOTE — ED Notes (Signed)
Patient states went to [facility] in [place] today and was diagnosed with pneumonia.   Patient complains of central chest pain since Sunday with SOB, N/V.   Patient states the MD there sent him here to be evaluated.

## 2015-12-27 NOTE — Discharge Instructions (Signed)
Read the information below.  Use the prescribed medication as directed.  Please discuss all new medications with your pharmacist.  You may return to the Emergency Department at any time for worsening condition or any new symptoms that concern you.   If you develop worsening chest pain, shortness of breath, high fever uncontrolled by tylenol or motrin, you pass out, or become weak or dizzy, return to the ER for a recheck.

## 2015-12-31 ENCOUNTER — Ambulatory Visit (INDEPENDENT_AMBULATORY_CARE_PROVIDER_SITE_OTHER): Payer: Commercial Managed Care - HMO | Admitting: Cardiovascular Disease

## 2015-12-31 ENCOUNTER — Encounter: Payer: Self-pay | Admitting: Cardiovascular Disease

## 2015-12-31 ENCOUNTER — Encounter: Payer: Self-pay | Admitting: *Deleted

## 2015-12-31 VITALS — BP 122/78 | HR 65 | Ht 66.0 in | Wt 202.0 lb

## 2015-12-31 DIAGNOSIS — R0602 Shortness of breath: Secondary | ICD-10-CM | POA: Diagnosis not present

## 2015-12-31 DIAGNOSIS — I25810 Atherosclerosis of coronary artery bypass graft(s) without angina pectoris: Secondary | ICD-10-CM

## 2015-12-31 DIAGNOSIS — E785 Hyperlipidemia, unspecified: Secondary | ICD-10-CM

## 2015-12-31 DIAGNOSIS — I10 Essential (primary) hypertension: Secondary | ICD-10-CM

## 2015-12-31 DIAGNOSIS — E876 Hypokalemia: Secondary | ICD-10-CM

## 2015-12-31 DIAGNOSIS — J441 Chronic obstructive pulmonary disease with (acute) exacerbation: Secondary | ICD-10-CM | POA: Diagnosis not present

## 2015-12-31 MED ORDER — ALBUTEROL SULFATE HFA 108 (90 BASE) MCG/ACT IN AERS: 2.0000 | INHALATION_SPRAY | Freq: Four times a day (QID) | RESPIRATORY_TRACT | Status: DC | PRN

## 2015-12-31 MED ORDER — POTASSIUM CHLORIDE ER 10 MEQ PO TBCR: 10.0000 meq | EXTENDED_RELEASE_TABLET | Freq: Every day | ORAL | Status: DC

## 2015-12-31 MED ORDER — BUDESONIDE-FORMOTEROL FUMARATE 160-4.5 MCG/ACT IN AERO: 2.0000 | INHALATION_SPRAY | Freq: Two times a day (BID) | RESPIRATORY_TRACT | Status: DC

## 2015-12-31 MED ORDER — PREDNISONE 20 MG PO TABS
20.0000 mg | ORAL_TABLET | Freq: Every day | ORAL | Status: DC
Start: 2015-12-31 — End: 2016-08-01

## 2015-12-31 NOTE — Assessment & Plan Note (Addendum)
He reports significant shortness of breath over the past week initially associated with fever  Suspect possible bronchitis given the cough, tight lungs on clinical exam  Given no significant relief on Levaquin , and albuterol,  Other medical management as needed.[name]  He was concerned about prednisone and we did call his oncologist at [facility] who said it was acceptable to use prednisone as needed  despite  Leukocytosis.  patient was under the impression that he was not to take prednisone here does was explained to him that he has been given permission.    We will start him on prednisone taper.  We did send a prescription in for Symbicort  If needed  We have refilled his albuterol   We did discuss that he may have a component of acute on chronic diastolic CHF and if symptoms do not  to improved by the end of the week that we may add  Lasix   he does not feel that he is able to go back to work, working on heavy machinery ,  Given his shortness of breath.  We will write him a work note to be excused until he can see pulmonary

## 2015-12-31 NOTE — Assessment & Plan Note (Signed)
Blood pressure is well controlled on today's visit. No changes made to the medications. 

## 2015-12-31 NOTE — Progress Notes (Signed)
Patient ID: [name], male    DOB: [birth year], 60 y.o.   MRN: [id number]  HPI Comments: 60 year old gentleman with a history of three vessel CAD, bypass surgery, hyperlipidemia and hypertension, smoking history for 23 years who stopped in 2003,  who presents for routine follow up of his coronary artery disease.   Chronic leukocytosis  followed by oncology  At [facility] , Dr. Phill Myron   In follow-up today,  He reports having Symptoms  Of shortness of breath  he reported having fever, cough  Last week, seen in urgent care started on Levaquin  Symptoms persisted and 2 days later went to the emergency room  Reports a severe tightness in his chest,  difficulty breathing,  Hard time moving air  He does have some relief with albuterol but this is temporary.   He has appointment 01/11/2016 with pulmonary , is hoping we can move this up for him   Denies any leg edema, abdominal bloating  No significant PND or orthopnea  Chest x-ray read as essentially clear but he was told he had pneumonia in the emergency room  Another doctor told him bronchitis with COPD exacerbation  in clinic today, the patient reports he was told not to take prednisone by oncologist   No recent change in his diet or activities , he does drink significant water daily  Works with heavy machinery  Denies any significant chest pain concerning for angina   EKG on today's visit shows normal sinus rhythm with old anteroseptal MI , old inferior MI, no change from previous EKG   other past medical history works 10 hours per day, 4 days per week  In heavy machinery erectile dysfunction symptoms  He is not on testosterone supplement as he was previously.  Reports that oncology does not want his testosterone high   previously reported some cramping in his legs , unclear if this is from pravastatin In the past he had frequent episodes of sweating, malaise/weakness initially felt to be secondary to bradycardia from  metoprolol  previous 30 day monitor in October 2015 given his continued symptoms of sweating, malaise, weakness. Monitor did not show any significant arrhythmia. No significant bradycardia noted   episode 03/28/2014 where he was working with his tools, sitting, started sweating all over. This happened at 9 AM. His work colleagues sat him in a chair and gave him something to drink. He felt weak for 3 days  catheterization at the end of 2014 for anginal symptoms showing patent grafts severe three-vessel disease. Medical management was recommended.  In recovery following a cardiac catheterization, he had episodes of sweating/flushing, malaise associated with bradycardia. Initially it was felt that this was a vagal response to pressure being applied to his groin. Later in recovery, he continued to have bradycardia. He was told to hold his metoprolol at the time of discharge.  flushing, dizziness, malaise that he was having at home prior to the procedure  resolved.     Allergies  Allergen Reactions  . Codeine     Breaks out into a sweat & rash  . Heparin     Respiratory failure  . Penicillins     Has patient had a PCN reaction causing immediate rash, facial/tongue/throat swelling, SOB or lightheadedness with hypotension: Yes Has patient had a PCN reaction causing severe rash involving mucus membranes or skin necrosis: No Has patient had a PCN reaction that required hospitalization No Has patient had a PCN reaction occurring within the last 10 years:  No If all of the above answers are "NO", then may proceed with Cephalosporin use.     . Tape Itching and Rash    Current Outpatient Prescriptions on File Prior to Visit  Medication Sig Dispense Refill  . acetaminophen (TYLENOL) 500 MG tablet Take 1,000 mg by mouth every 6 (six) hours as needed for mild pain or fever.    [name] amLODipine (NORVASC) 10 MG tablet TAKE 1/2-1 TABLET BY MOUTH EVERY DAY (Patient taking differently: Take 5 mg by mouth  every morning) 30 tablet 3  . aspirin 81 MG EC tablet Take 162 mg by mouth every morning.     [name] levofloxacin (LEVAQUIN) 750 MG tablet Take 1 tablet by mouth daily.    [name] losartan-hydrochlorothiazide (HYZAAR) 100-25 MG per tablet TAKE 1/2 TABLET BY MOUTH EVERY DAY 30 tablet 6  . nitroGLYCERIN (NITROSTAT) 0.4 MG SL tablet Place 1 tablet (0.4 mg total) under the tongue every 5 (five) minutes as needed. 25 tablet 6  . pravastatin (PRAVACHOL) 40 MG tablet TAKE 1 TABLET BY MOUTH AT BEDTIME 30 tablet 3  . promethazine (PHENERGAN) 25 MG tablet Take 1 tablet (25 mg total) by mouth every 6 (six) hours as needed for nausea or vomiting. 20 tablet 0  . VIAGRA 100 MG tablet TAKE 1 TABLET BY MOUTH AS NEEDED FOR ERECTILE DYSFUNCTION 7 tablet 3  . VIRTUSSIN A/C 100-10 MG/5ML syrup Take 10 mLs by mouth every 4 (four) hours as needed for cough or congestion.      No current facility-administered medications on file prior to visit.    Past Medical History  Diagnosis Date  . CAD (coronary artery disease)   . Chronic fatigue   . HTN (hypertension)   . HLD (hyperlipidemia)   . Leukocytosis     Past Surgical History  Procedure Laterality Date  . Coronary artery bypass graft    . Neck surgery      x2  . Hernia repair    . Appendectomy      Social History  reports that he quit smoking about 13 years ago. His smoking use included Cigarettes. He has a 21 pack-year smoking history. He does not have any smokeless tobacco history on file. He reports that he drinks about 0.5 oz of alcohol per week. He reports that he does not use illicit drugs.  Family History family history includes Heart attack (age of onset: 48) in his father; Heart attack (age of onset: 49) in his mother.  Review of Systems  Constitutional: Negative.   Respiratory: Negative.   Cardiovascular: Negative.   Genitourinary:       Erectile dysfunction  Musculoskeletal: Negative.   Neurological: Negative.   Hematological: Negative.    Psychiatric/Behavioral: Negative.   All other systems reviewed and are negative.  BP 122/78 mmHg  Pulse 65  Ht 5\' 6"  (1.676 m)  Wt 202 lb (91.627 kg)  BMI 32.62 kg/m2  SpO2 98%  Physical Exam  Constitutional: He is oriented to person, place, and time. He appears well-developed and well-nourished.  HENT:  Head: Normocephalic.  Nose: Nose normal.  Mouth/Throat: Oropharynx is clear and moist.  Eyes: Conjunctivae are normal. Pupils are equal, round, and reactive to light.  Neck: Normal range of motion. Neck supple. No JVD present.  Cardiovascular: Normal rate, regular rhythm, S1 normal, S2 normal, normal heart sounds and intact distal pulses.  Exam reveals no gallop and no friction rub.   No murmur heard. Pulmonary/Chest: He has decreased breath sounds. He has no  wheezes. He has no rales. He exhibits no tenderness.  Abdominal: Soft. Bowel sounds are normal. He exhibits no distension. There is no tenderness.  Musculoskeletal: Normal range of motion. He exhibits no edema or tenderness.  Lymphadenopathy:    He has no cervical adenopathy.  Neurological: He is alert and oriented to person, place, and time. Coordination normal.  Skin: Skin is warm and dry. No rash noted. No erythema.  Psychiatric: He has a normal mood and affect. His behavior is normal. Judgment and thought content normal.      Assessment and Plan   Nursing note and vitals reviewed.

## 2015-12-31 NOTE — Assessment & Plan Note (Signed)
As detailed above suspect secondary to COPD exacerbation brought on by bronchitis.  Plan as above

## 2015-12-31 NOTE — Patient Instructions (Addendum)
You likely have COPD exacerbation secondary to acute bronchitis  We will start symbicort 2 puffs twice a day (steroid inhaler)  Start potassium one a day  Please start prednisone taper 60 mg daily for the first three days Then 40 mg daily for the next three days Then 20 mg daily for the next three days Then 10 mg daily for the next three days  Please call us if you have new issues that need to be addressed before your next appt.  Your physician wants you to follow-up in: 6 months.  You will receive a reminder letter in the mail two months in advance. If you don't receive a letter, please call our office to schedule the follow-up appointment.

## 2015-12-31 NOTE — Assessment & Plan Note (Signed)
Current presentation less likely secondary to ischemia but if symptoms do persist despite maximal pulmonary therapy, may need to treated for CHF and even ischemia

## 2015-12-31 NOTE — Assessment & Plan Note (Signed)
Low potassium last week when seen in the emergency room. Hospital records reviewed  Likely secondary to HCTZ. Recommended he start potassium 10 mEq daily    Total encounter time more than 40 minutes  Greater than 50% was spent in counseling and coordination of care with the patient

## 2015-12-31 NOTE — Assessment & Plan Note (Signed)
Cholesterol is at goal on the current lipid regimen. No changes to the medications were made.  

## 2016-01-02 ENCOUNTER — Ambulatory Visit: Payer: Commercial Managed Care - HMO | Admitting: Cardiovascular Disease

## 2016-01-07 ENCOUNTER — Other Ambulatory Visit: Payer: Self-pay | Admitting: Cardiovascular Disease

## 2016-01-11 ENCOUNTER — Encounter: Payer: Self-pay | Admitting: Internal Medicine

## 2016-01-11 ENCOUNTER — Ambulatory Visit (INDEPENDENT_AMBULATORY_CARE_PROVIDER_SITE_OTHER): Payer: Commercial Managed Care - HMO | Admitting: Internal Medicine

## 2016-01-11 VITALS — BP 150/82 | HR 66 | Ht 66.0 in | Wt 203.0 lb

## 2016-01-11 DIAGNOSIS — J209 Acute bronchitis, unspecified: Secondary | ICD-10-CM

## 2016-01-11 DIAGNOSIS — R0602 Shortness of breath: Secondary | ICD-10-CM | POA: Diagnosis not present

## 2016-01-11 MED ORDER — GLYCOPYRROLATE-FORMOTEROL 9-4.8 MCG/ACT IN AERO
2.0000 | INHALATION_SPRAY | Freq: Two times a day (BID) | RESPIRATORY_TRACT | Status: DC
Start: 2016-01-11 — End: 2017-01-30

## 2016-01-11 MED ORDER — GLYCOPYRROLATE-FORMOTEROL 9-4.8 MCG/ACT IN AERO: 2.0000 | INHALATION_SPRAY | Freq: Two times a day (BID) | RESPIRATORY_TRACT | Status: DC

## 2016-01-11 NOTE — Patient Instructions (Addendum)
--  Cancel symbicort, start bevespi inhaler. 2 puff twice daily.   --PFT in 3-4 weeks and follow up after that.   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

## 2016-01-11 NOTE — Progress Notes (Signed)
[facility] Consultation      Assessment and Plan:  Acute Bronchitis.  --Appears improved; continue prednisone taper, start Bevespi inhaler.  --Advance activity as tolerated.   Pulmonary edema/pleural effusion.  --Seen on CXR, this may have been an acute episode of pulmonary edema from acute respiratory failure?  --Discussed with Dr. Rockey Situ, his office will be contacting patient to potentially set up cardiac imaging.   Excessive daytime sleepiness. -Witnessed apneas, suspicious for obstructive sleep apnea. -We'll address further at a subsequent visit, once his acute issues have resolved.    Date: 01/11/2016  MRN# [id number] [name] 1955-09-11  Referring Physician: Rockey Situ.   [name] is a 60 y.o. old male seen in consultation for chief complaint of:    Chief Complaint  Patient presents with  . Hospitalization Follow-up    pt states he was seen @ urgent care 5/2 dx w/PNA noticed no improvement w/treatment went back to urgent was sent to ED no PNA was seen on CXR dx w/ possible bronchitis. c/o wheezing, sob, cp/tightness. denies cough. breaks out in sweats, weakness & sob with exertion X3w    HPI:   The patient is 60 year old male with a history of significant for coronary artery disease, hypertension, chronic leukocytosis. He was noted in the past to have a fever, he was thought to have acute bronchitis, and started on Levaquin and albuterol. He was given a prescription for Symbicort, albuterol as well as a prednisone taper. He was seen by cardiology on 5/8 and started on a pred taper due to chest tightness and dyspnea thought to represent AECOPD and acute bronchitis.   He does not have a history of COPD, he has a history of CAD, CABG but not CHF. Currently he feels that his breathing is short with exertion. He occasionally breaks out in sweats. 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 Since starting on the steroids last week he  feels that his breathing has improved. He has tried to start doing things outside but still is short.  He stopped smoking in 2003 at the time time he had a CABG.  He is on   He has trouble sleeping, which is a regular problem, his wife notes that he gasps for air while sleeping, and she has woken him up for trouble breathing.   Review of chest x-ray images from 12/27/15: Shows normal lung parenchyma with mild bronchitic changes, elevated right diaphragm. Slight left basilar atelectasis and possible fluid in the fissure versus scarring, versus stranding atelectasis. CXR from urgent care 5/2 also reviewed; unremarkable.   Oxygen sat at rest on RA is 94% and HR 64. After walking 1200 feet sat was 97% and HR 77.   PMHX:   Past Medical History  Diagnosis Date  . CAD (coronary artery disease)   . Chronic fatigue   . HTN (hypertension)   . HLD (hyperlipidemia)   . Leukocytosis    Surgical Hx:  Past Surgical History  Procedure Laterality Date  . Coronary artery bypass graft    . Neck surgery      x2  . Hernia repair    . Appendectomy     Family Hx:  Family History  Problem Relation Age of Onset  . Heart attack Mother 62  . Heart attack Father 63   Social Hx:   Social History  Substance Use Topics  . Smoking status: Former Smoker -- 1.00 packs/day for 21 years    Types: Cigarettes  Quit date: 03/20/2002  . Smokeless tobacco: Not on file     Comment: Tobacco use - no  . Alcohol Use: 0.5 oz/week    1 Standard drinks or equivalent per week     Comment: daily   Medication:   Current Outpatient Rx  Name  Route  Sig  Dispense  Refill  . acetaminophen (TYLENOL) 500 MG tablet   Oral   Take 1,000 mg by mouth every 6 (six) hours as needed for mild pain or fever.         [name] albuterol (PROVENTIL HFA;VENTOLIN HFA) 108 (90 Base) MCG/ACT inhaler   Inhalation   Inhale 2 puffs into the lungs every 6 (six) hours as needed for wheezing or shortness of breath.   1 Inhaler   2   .  amLODipine (NORVASC) 10 MG tablet      TAKE 1/2-1 TABLET BY MOUTH EVERY DAY Patient taking differently: Take 5 mg by mouth every morning   30 tablet   3   . aspirin 81 MG EC tablet   Oral   Take 162 mg by mouth every morning.          . budesonide-formoterol (SYMBICORT) 160-4.5 MCG/ACT inhaler   Inhalation   Inhale 2 puffs into the lungs 2 (two) times daily.   1 Inhaler   12   . levofloxacin (LEVAQUIN) 750 MG tablet   Oral   Take 1 tablet by mouth daily.         [name] losartan-hydrochlorothiazide (HYZAAR) 100-25 MG per tablet      TAKE 1/2 TABLET BY MOUTH EVERY DAY   30 tablet   6   . NITROSTAT 0.4 MG SL tablet      PLACE 1 TABLET UNDER THE TONGUE EVERY 5 MINUTES AS NEEDED FOR CHEST PAIN FOR UP TO 3 DOSES   25 tablet   3   . potassium chloride (K-DUR) 10 MEQ tablet   Oral   Take 1 tablet (10 mEq total) by mouth daily.   90 tablet   3   . pravastatin (PRAVACHOL) 40 MG tablet      TAKE 1 TABLET BY MOUTH AT BEDTIME   30 tablet   3   . predniSONE (DELTASONE) 20 MG tablet   Oral   Take 1 tablet (20 mg total) by mouth daily with breakfast.   20 tablet   1   . promethazine (PHENERGAN) 25 MG tablet   Oral   Take 1 tablet (25 mg total) by mouth every 6 (six) hours as needed for nausea or vomiting.   20 tablet   0   . VIAGRA 100 MG tablet      TAKE 1 TABLET BY MOUTH AS NEEDED FOR ERECTILE DYSFUNCTION   7 tablet   3   . VIRTUSSIN A/C 100-10 MG/5ML syrup   Oral   Take 10 mLs by mouth every 4 (four) hours as needed for cough or congestion.            Dispense as written.       Allergies:  Codeine; Heparin; Penicillins; and Tape  Review of Systems: Gen:  Denies  fever, sweats, chills HEENT: Denies blurred vision, double vision. bleeds, sore throat Cvc:  No dizziness, chest pain. Resp:   Denies cough or sputum production, shortness of breath Gi: Denies swallowing difficulty, stomach pain. Gu:  Denies bladder incontinence, burning urine Ext:   No  Joint pain, stiffness. Skin: No skin rash,  hives  Endoc:  No  polyuria, polydipsia. Psych: No depression, insomnia. Other:  All other systems were reviewed with the patient and were negative other that what is mentioned in the HPI.   Physical Examination:   VS: BP 150/82 mmHg  Pulse 66  Ht 5\' 6"  (1.676 m)  Wt 203 lb (92.08 kg)  BMI 32.78 kg/m2  SpO2 97%  General Appearance: No distress  Neuro:without focal findings,  speech normal,  HEENT: PERRLA, EOM intact.   Pulmonary: normal breath sounds, No wheezing.  CardiovascularNormal S1,S2.  No m/r/g.   Abdomen: Benign, Soft, non-tender. Renal:  No costovertebral tenderness  GU:  No performed at this time. Endoc: No evident thyromegaly, no signs of acromegaly. Skin:   warm, no rashes, no ecchymosis  Extremities: normal, no cyanosis, clubbing.  Other findings:    LABORATORY PANEL:   CBC No results for input(s): WBC, HGB, HCT, PLT in the last 168 hours. ------------------------------------------------------------------------------------------------------------------  Chemistries  No results for input(s): NA, K, CL, CO2, GLUCOSE, BUN, CREATININE, CALCIUM, MG, AST, ALT, ALKPHOS, BILITOT in the last 168 hours.  Invalid input(s): GFRCGP ------------------------------------------------------------------------------------------------------------------  Cardiac Enzymes No results for input(s): TROPONINI in the last 168 hours. ------------------------------------------------------------  RADIOLOGY:  No results found.     Thank  you for the consultation and for allowing [facility] to assist in the care of your patient. Our recommendations are noted above.  Please contact us if we can be of further service.   Marda Stalker, MD.  Board Certified in Internal Medicine, Pulmonary Medicine, [place], and Sleep Medicine.  [facility] Office Number: [phone]  Patricia Pesa, [name]  Vilinda Boehringer, [name]  Merton Border, M.D  01/11/2016

## 2016-01-14 ENCOUNTER — Other Ambulatory Visit: Payer: Self-pay

## 2016-01-14 DIAGNOSIS — I509 Heart failure, unspecified: Secondary | ICD-10-CM

## 2016-01-17 ENCOUNTER — Other Ambulatory Visit: Payer: Commercial Managed Care - HMO

## 2016-01-17 ENCOUNTER — Ambulatory Visit (INDEPENDENT_AMBULATORY_CARE_PROVIDER_SITE_OTHER): Payer: Commercial Managed Care - HMO

## 2016-01-17 ENCOUNTER — Other Ambulatory Visit: Payer: Self-pay

## 2016-01-17 DIAGNOSIS — I509 Heart failure, unspecified: Secondary | ICD-10-CM

## 2016-01-18 ENCOUNTER — Ambulatory Visit: Payer: Commercial Managed Care - HMO | Admitting: Cardiovascular Disease

## 2016-01-20 ENCOUNTER — Other Ambulatory Visit: Payer: Self-pay | Admitting: Cardiovascular Disease

## 2016-02-01 ENCOUNTER — Other Ambulatory Visit: Payer: Commercial Managed Care - HMO

## 2016-02-14 ENCOUNTER — Ambulatory Visit: Payer: Commercial Managed Care - HMO | Admitting: Internal Medicine

## 2016-02-25 ENCOUNTER — Other Ambulatory Visit: Payer: Self-pay | Admitting: Cardiovascular Disease

## 2016-05-26 ENCOUNTER — Other Ambulatory Visit: Payer: Self-pay | Admitting: Cardiovascular Disease

## 2016-07-11 ENCOUNTER — Ambulatory Visit: Payer: Commercial Managed Care - HMO | Admitting: Cardiovascular Disease

## 2016-07-13 ENCOUNTER — Other Ambulatory Visit: Payer: Self-pay | Admitting: Cardiovascular Disease

## 2016-08-01 ENCOUNTER — Ambulatory Visit (INDEPENDENT_AMBULATORY_CARE_PROVIDER_SITE_OTHER): Payer: Commercial Managed Care - HMO | Admitting: Cardiovascular Disease

## 2016-08-01 ENCOUNTER — Encounter: Payer: Self-pay | Admitting: Cardiovascular Disease

## 2016-08-01 VITALS — BP 148/80 | HR 60 | Ht 66.0 in | Wt 211.2 lb

## 2016-08-01 DIAGNOSIS — I10 Essential (primary) hypertension: Secondary | ICD-10-CM | POA: Diagnosis not present

## 2016-08-01 DIAGNOSIS — E782 Mixed hyperlipidemia: Secondary | ICD-10-CM

## 2016-08-01 DIAGNOSIS — I25810 Atherosclerosis of coronary artery bypass graft(s) without angina pectoris: Secondary | ICD-10-CM

## 2016-08-01 DIAGNOSIS — N529 Male erectile dysfunction, unspecified: Secondary | ICD-10-CM | POA: Diagnosis not present

## 2016-08-01 MED ORDER — PRAVASTATIN SODIUM 40 MG PO TABS: 40.0000 mg | ORAL_TABLET | Freq: Every day | ORAL | 4 refills | Status: DC

## 2016-08-01 MED ORDER — LOSARTAN POTASSIUM-HCTZ 100-25 MG PO TABS: 1.0000 | ORAL_TABLET | Freq: Every day | ORAL | 3 refills | Status: DC

## 2016-08-01 MED ORDER — AMLODIPINE BESYLATE 10 MG PO TABS: ORAL_TABLET | ORAL | 3 refills | Status: DC

## 2016-08-01 NOTE — Progress Notes (Signed)
Cardiology Office Note  Date:  08/01/2016   ID:  [name], [name] [birth year], MRN [id number]  PCP:  Cyndi Bender, PA-C   Chief Complaint  Patient presents with  . other    6 mo f/u.  Meds reviewed verbally w/ pt.    HPI:  60 year old gentleman with a history of three vessel CAD, bypass surgery, hyperlipidemia and hypertension, smoking history for 23 years who stopped in 2003,  who presents for routine follow up of his coronary artery disease.   Chronic leukocytosis  followed by oncology  At [facility] , Dr. Phill Myron   In follow-up today,  He reports Doing well, no complaints  Denies any leg edema, abdominal bloating  No significant PND or orthopnea   No recent change in his diet or activities ,  Weight is up significantly since his last clinic visit, eating more desserts, [facility]   Works with heavy machinery  Denies any significant chest pain concerning for angina Problems with erectile dysfunction   EKG on today's visit shows normal sinus rhythm with rate 60 bpm old anteroseptal MI , old inferior MI, no change from previous EKG   other past medical history works 10 hours per day, 4 days per week  In heavy machinery erectile dysfunction symptoms  He is not on testosterone supplement as he was previously.  Reports that oncology does not want his testosterone high   previously reported some cramping in his legs , unclear if this is from pravastatin In the past he had frequent episodes of sweating, malaise/weakness initially felt to be secondary to bradycardia from metoprolol  previous 30 day monitor in October 2015 given his continued symptoms of sweating, malaise, weakness. Monitor did not show any significant arrhythmia. No significant bradycardia noted   episode 03/28/2014 where he was working with his tools, sitting, started sweating all over. This happened at 9 AM. His work colleagues sat him in a chair and gave him something to drink. He felt weak for 3  days  catheterization at the end of 2014 for anginal symptoms showing patent grafts severe three-vessel disease. Medical management was recommended.  In recovery following a cardiac catheterization, he had episodes of sweating/flushing, malaise associated with bradycardia. Initially it was felt that this was a vagal response to pressure being applied to his groin. Later in recovery, he continued to have bradycardia. He was told to hold his metoprolol at the time of discharge.  flushing, dizziness, malaise that he was having at home prior to the procedure  resolved.   PMH:   has a past medical history of CAD (coronary artery disease); Chronic fatigue; HLD (hyperlipidemia); HTN (hypertension); and Leukocytosis.  PSH:    Past Surgical History:  Procedure Laterality Date  . APPENDECTOMY    . CORONARY ARTERY BYPASS GRAFT    . HERNIA REPAIR    . NECK SURGERY     x2    Current Outpatient Prescriptions  Medication Sig Dispense Refill  . acetaminophen (TYLENOL) 500 MG tablet Take 1,000 mg by mouth every 6 (six) hours as needed for mild pain or fever.    [name] albuterol (PROVENTIL HFA;VENTOLIN HFA) 108 (90 Base) MCG/ACT inhaler Inhale 2 puffs into the lungs every 6 (six) hours as needed for wheezing or shortness of breath. 1 Inhaler 2  . amLODipine (NORVASC) 10 MG tablet TAKE 1/2-1 TABLET BY MOUTH EVERY DAY 90 tablet 3  . aspirin 81 MG EC tablet Take 162 mg by mouth every morning.     [name]  budesonide-formoterol (SYMBICORT) 160-4.5 MCG/ACT inhaler Inhale 2 puffs into the lungs 2 (two) times daily. 1 Inhaler 12  . Glycopyrrolate-Formoterol (BEVESPI AEROSPHERE) 9-4.8 MCG/ACT AERO Inhale 2 puffs into the lungs 2 (two) times daily. 9 Inhaler 0  . Glycopyrrolate-Formoterol (BEVESPI AEROSPHERE) 9-4.8 MCG/ACT AERO Inhale 2 puffs into the lungs 2 (two) times daily. 10.7 g 3  . losartan-hydrochlorothiazide (HYZAAR) 100-25 MG tablet Take 1 tablet by mouth daily. 90 tablet 3  . NITROSTAT 0.4 MG SL tablet PLACE 1  TABLET UNDER THE TONGUE EVERY 5 MINUTES AS NEEDED FOR CHEST PAIN FOR UP TO 3 DOSES 25 tablet 3  . pravastatin (PRAVACHOL) 40 MG tablet Take 1 tablet (40 mg total) by mouth at bedtime. 90 tablet 4  . VIAGRA 100 MG tablet TAKE 1 TABLET BY MOUTH AS NEEDED FOR ERECTILE DYSFUNCTION 7 tablet 1   No current facility-administered medications for this visit.      Allergies:   Codeine; Heparin; Heparin (bovine); Penicillins; and Tape   Social History:  The patient  reports that he quit smoking about 14 years ago. His smoking use included Cigarettes. He has a 21.00 pack-year smoking history. He has never used smokeless tobacco. He reports that he drinks about 0.5 oz of alcohol per week . He reports that he does not use drugs.   Family History:   family history includes Heart attack (age of onset: 42) in his father; Heart attack (age of onset: 36) in his mother.    Review of Systems: Review of Systems  Constitutional: Negative.   Respiratory: Negative.   Cardiovascular: Negative.   Gastrointestinal: Negative.   Genitourinary:       Problems with erectile dysfunction  Musculoskeletal: Negative.   Neurological: Negative.   Psychiatric/Behavioral: Negative.   All other systems reviewed and are negative.    PHYSICAL EXAM: VS:  BP (!) 148/80 (BP Location: Right Arm, Patient Position: Sitting, Cuff Size: Normal)   Pulse 60   Ht 5\' 6"  (1.676 m)   Wt 211 lb 4 oz (95.8 kg)   BMI 34.10 kg/m  , BMI Body mass index is 34.1 kg/m. GEN: Well nourished, well developed, in no acute distress, obese  HEENT: normal  Neck: no JVD, carotid bruits, or masses Cardiac: RRR; no murmurs, rubs, or gallops,no edema  Respiratory:  clear to auscultation bilaterally, normal work of breathing GI: soft, nontender, nondistended, + BS MS: no deformity or atrophy  Skin: warm and dry, no rash Neuro:  Strength and sensation are intact Psych: euthymic mood, full affect    Recent Labs: 12/27/2015: BUN 7; Creatinine,  Ser 0.88; Hemoglobin 14.1; Platelets 245; Potassium 3.0; Sodium 132    Lipid Panel Lab Results  Component Value Date   CHOL 134 07/30/2012   HDL 46 07/30/2012   LDLCALC 61 07/30/2012   TRIG 135 07/30/2012      Wt Readings from Last 3 Encounters:  08/01/16 211 lb 4 oz (95.8 kg)  01/11/16 203 lb (92.1 kg)  12/31/15 202 lb (91.6 kg)       ASSESSMENT AND PLAN:  HYPERTENSION, BENIGN - Plan: EKG 12-Lead Blood pressure mildly elevated on today's visit. No changes made to the medications. Improved numbers on recheck  Atherosclerosis of coronary artery bypass graft of native heart without angina pectoris - Plan: EKG 12-Lead Currently with no symptoms of angina. No further workup at this time. Continue current medication regimen.  Mixed hyperlipidemia Recommended weight loss, repeat cholesterol numbers when he follows up with primary care early 2018. If  numbers continue to run high, could either increase pravastatin or start zetia  Erectile dysfunction, unspecified erectile dysfunction type Recommended he discuss this with urology May need to check testosterone   Total encounter time more than 25 minutes  Greater than 50% was spent in counseling and coordination of care with the patient  Disposition:   F/U  6 months   Orders Placed This Encounter  Procedures  . EKG 12-Lead     Signed, Esmond Plants, [name], Ph.D. 08/01/2016  [place], [place]

## 2016-08-01 NOTE — Patient Instructions (Addendum)
Medication Instructions:   No medication changes made  If cholesterol runs high, Option include  Increase the pravastatin up to 80 mg once a day Or add zetia one a day   Labwork:  No new labs needed  Testing/Procedures:  No further testing at this time   I recommend watching educational videos on topics of interest to you at:       [email or web address].com  Enter code: [email or web address]    Follow-Up: It was a pleasure seeing you in the office today. Please call us if you have new issues that need to be addressed before your next appt.  [phone]  Your physician wants you to follow-up in: 6 months.  You will receive a reminder letter in the mail two months in advance. If you don't receive a letter, please call our office to schedule the follow-up appointment.  If you need a refill on your cardiac medications before your next appointment, please call your pharmacy.

## 2017-01-28 NOTE — Progress Notes (Signed)
Cardiology Office Note  Date:  01/30/2017   ID:  [name], DOB October 24, 1955, MRN [id number]  PCP:  Cyndi Bender, PA-C   Chief Complaint  Patient presents with  . OTHER    6 month f/u no complaints today. Meds reviewed verbally with pt.    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  cath 2014 showing patent grafts severe three-vessel disease. hyperlipidemia  hypertension,  smoking history for 23 years who stopped in 2003,   Chronic leukocytosis  followed by oncology  At [facility] , Dr. Phill Myron who presents for routine follow up of his coronary artery disease.   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  Reports that oncology does not want his testosterone high   previously reported some cramping in his legs , unclear if this is from pravastatin In the past he had frequent episodes of sweating, malaise/weakness initially felt to be secondary to bradycardia from metoprolol  previous 30 day monitor in October 2015 given his continued symptoms of sweating, malaise, weakness. Monitor did not show any significant arrhythmia. No significant bradycardia noted   episode 03/28/2014 where he was working with his tools,  sitting, started sweating all over. This happened at 9 AM. His work colleagues sat him in a chair and gave him something to drink. He felt weak for 3 days  catheterization at the end of 2014 for anginal symptoms showing patent grafts severe three-vessel disease. Medical management was recommended.  In recovery following a cardiac catheterization, he had episodes of sweating/flushing, malaise associated with bradycardia. Initially it was felt that this was a vagal response to pressure being applied to his groin. Later in recovery, he continued to have bradycardia. He was told to hold his metoprolol at the time of discharge.  flushing, dizziness, malaise that he was having at home prior to the procedure  resolved.   PMH:   has a past medical history of CAD (coronary artery disease); Chronic fatigue; HLD (hyperlipidemia); HTN (hypertension); and Leukocytosis.  PSH:    Past Surgical History:  Procedure Laterality Date  . APPENDECTOMY    . CORONARY ARTERY BYPASS GRAFT    . HERNIA REPAIR    . NECK SURGERY     x2    Current Outpatient Prescriptions  Medication Sig Dispense Refill  . amLODipine (NORVASC) 10 MG tablet TAKE 1/2-1 TABLET BY MOUTH EVERY DAY 90 tablet 3  . aspirin 81 MG EC tablet Take 162 mg by mouth every morning.     [name] losartan-hydrochlorothiazide (HYZAAR) 100-25 MG tablet Take 1 tablet by mouth daily. 90 tablet 3  . NITROSTAT 0.4 MG SL tablet PLACE 1 TABLET UNDER THE TONGUE EVERY  5 MINUTES AS NEEDED FOR CHEST PAIN FOR UP TO 3 DOSES 25 tablet 3  . pravastatin (PRAVACHOL) 40 MG tablet Take 1 tablet (40 mg total) by mouth at bedtime. 90 tablet 4  . VIAGRA 100 MG tablet TAKE 1 TABLET BY MOUTH AS NEEDED FOR ERECTILE DYSFUNCTION 7 tablet 1   No current facility-administered medications for this visit.      Allergies:   Codeine; Heparin; Heparin (bovine); Penicillins; and Tape   Social History:  The patient  reports that he quit smoking about 14 years ago. His smoking use  included Cigarettes. He has a 21.00 pack-year smoking history. He has never used smokeless tobacco. He reports that he drinks about 0.5 oz of alcohol per week . He reports that he does not use drugs.   Family History:   family history includes Heart attack (age of onset: 75) in his father; Heart attack (age of onset: 19) in his mother.    Review of Systems: Review of Systems  Constitutional: Negative.   Respiratory: Negative.   Cardiovascular: Negative.   Gastrointestinal: Negative.   Genitourinary:       Problems with erectile dysfunction  Musculoskeletal: Negative.   Neurological: Negative.   Psychiatric/Behavioral: Negative.   All other systems reviewed and are negative.    PHYSICAL EXAM: VS:  BP 118/64 (BP Location: Left Arm, Patient Position: Sitting, Cuff Size: Normal)   Pulse 65   Ht 5\' 4"  (1.626 m)   Wt 203 lb 8 oz (92.3 kg)   BMI 34.93 kg/m  , BMI Body mass index is 34.93 kg/m.  GEN: Well nourished, well developed, in no acute distress, obese  HEENT: normal  Neck: no JVD, carotid bruits, or masses Cardiac: RRR; no murmurs, rubs, or gallops,no edema  Respiratory:  clear to auscultation bilaterally, normal work of breathing GI: soft, nontender, nondistended, + BS MS: no deformity or atrophy  Skin: warm and dry, no rash Neuro:  Strength and sensation are intact Psych: euthymic mood, full affect    Recent Labs: No results found for requested labs within last 8760 hours.    Lipid Panel Lab Results  Component Value Date   CHOL 134 07/30/2012   HDL 46 07/30/2012   LDLCALC 61 07/30/2012   TRIG 135 07/30/2012      Wt Readings from Last 3 Encounters:  01/30/17 203 lb 8 oz (92.3 kg)  08/01/16 211 lb 4 oz (95.8 kg)  01/11/16 203 lb (92.1 kg)       ASSESSMENT AND PLAN:  HYPERTENSION, BENIGN - Plan: EKG 12-Lead Blood pressure is well controlled on today's visit. No changes made to the medications.   Atherosclerosis of coronary artery bypass graft of  native heart without angina pectoris - Plan: EKG 12-Lead Currently with no symptoms of angina. No further workup at this time.  Suggested goal LDL less than 70, discussed various strategies  Mixed hyperlipidemia 8 pound weight loss We will review follow-up lipid panel August 2018 To achieve goal LDL less than 70, could either increase pravastatin or start zetia if needed  Erectile dysfunction, unspecified erectile dysfunction type Recommended he discuss this with urology    Total encounter time more than 15 minutes  Greater than 50% was spent in counseling and coordination of care with the patient  Disposition:   F/U  12 months   Orders Placed This Encounter  Procedures  . EKG 12-Lead     Signed, Esmond Plants, [name], Ph.D. 01/30/2017  [place], [place]

## 2017-01-30 ENCOUNTER — Encounter: Payer: Self-pay | Admitting: Cardiovascular Disease

## 2017-01-30 ENCOUNTER — Ambulatory Visit (INDEPENDENT_AMBULATORY_CARE_PROVIDER_SITE_OTHER): Payer: Commercial Managed Care - HMO | Admitting: Cardiovascular Disease

## 2017-01-30 VITALS — BP 118/64 | HR 65 | Ht 64.0 in | Wt 203.5 lb

## 2017-01-30 DIAGNOSIS — J441 Chronic obstructive pulmonary disease with (acute) exacerbation: Secondary | ICD-10-CM

## 2017-01-30 DIAGNOSIS — I25708 Atherosclerosis of coronary artery bypass graft(s), unspecified, with other forms of angina pectoris: Secondary | ICD-10-CM

## 2017-01-30 DIAGNOSIS — E782 Mixed hyperlipidemia: Secondary | ICD-10-CM | POA: Diagnosis not present

## 2017-01-30 DIAGNOSIS — I10 Essential (primary) hypertension: Secondary | ICD-10-CM

## 2017-01-30 DIAGNOSIS — R0602 Shortness of breath: Secondary | ICD-10-CM

## 2017-01-30 NOTE — Patient Instructions (Addendum)

## 2017-08-10 ENCOUNTER — Other Ambulatory Visit: Payer: Self-pay | Admitting: Cardiovascular Disease

## 2017-08-12 ENCOUNTER — Other Ambulatory Visit: Payer: Self-pay | Admitting: Cardiovascular Disease

## 2017-09-13 ENCOUNTER — Other Ambulatory Visit: Payer: Self-pay | Admitting: Cardiovascular Disease

## 2017-11-12 ENCOUNTER — Telehealth: Payer: Self-pay | Admitting: Cardiovascular Disease

## 2017-11-12 NOTE — Telephone Encounter (Signed)
Pt wife states the pharmacist has recommended pt change from Losartan. Please call.

## 2017-11-12 NOTE — Telephone Encounter (Signed)
Patients wife calling in stating that she does not want him taking something that is potentially harmful. Reviewed that this recall only affects certain manufacturers and that the pharmacy should be able to tell her if his has been affected. She states that he is still taking it at this time but that they would like another option. Advised that I would check with Dr. Rockey Situ on other recommendations and then I would give her a call back. She verbalized understanding with no further questions at this time.

## 2017-11-15 NOTE — Telephone Encounter (Signed)
Would try the lisinopril 20 HCTZ 25

## 2017-11-16 NOTE — Telephone Encounter (Addendum)
Spoke with patients wife per release form and reviewed Dr. Donivan Scull recommendations. She reports that patient does not like to take his medications and will need to talk with him before we send in the replacement medication. She states that he may want to wait until next appointment and would like to get this scheduled. Reviewed that if he should change his mind to give me a call and I would be happy to send in new prescriptions. She verbalized understanding with no further questions at this time.

## 2018-01-05 ENCOUNTER — Telehealth: Payer: Self-pay | Admitting: Cardiovascular Disease

## 2018-01-05 NOTE — Telephone Encounter (Signed)
Routing to Dr Gollan for advice. 

## 2018-01-05 NOTE — Telephone Encounter (Signed)
Pt wife calling stating they received a letter stating that the Losartan that patient is taking is now being recalled  They would like to know if we can prescribe something else in place of this  Please advise

## 2018-01-05 NOTE — Telephone Encounter (Signed)
Would call pharmacy and see if they have new batch of losartan or other arb to switch to

## 2018-01-07 NOTE — Telephone Encounter (Signed)
Spoke with patients wife per release and recommended that she reach out to the pharmacy. Advised that only certain companies were affected and to check and see if they can still get it. She verbalized understanding of our conversation, agreement with plan, and had no further questions at this time.

## 2018-01-29 ENCOUNTER — Ambulatory Visit (INDEPENDENT_AMBULATORY_CARE_PROVIDER_SITE_OTHER): Payer: 59 | Admitting: Cardiovascular Disease

## 2018-01-29 ENCOUNTER — Encounter: Payer: Self-pay | Admitting: *Deleted

## 2018-01-29 ENCOUNTER — Encounter: Payer: Self-pay | Admitting: Cardiovascular Disease

## 2018-01-29 VITALS — BP 140/66 | HR 61 | Ht 65.0 in | Wt 206.8 lb

## 2018-01-29 DIAGNOSIS — I25708 Atherosclerosis of coronary artery bypass graft(s), unspecified, with other forms of angina pectoris: Secondary | ICD-10-CM | POA: Diagnosis not present

## 2018-01-29 DIAGNOSIS — R0602 Shortness of breath: Secondary | ICD-10-CM

## 2018-01-29 DIAGNOSIS — I10 Essential (primary) hypertension: Secondary | ICD-10-CM | POA: Diagnosis not present

## 2018-01-29 DIAGNOSIS — E782 Mixed hyperlipidemia: Secondary | ICD-10-CM | POA: Diagnosis not present

## 2018-01-29 DIAGNOSIS — J441 Chronic obstructive pulmonary disease with (acute) exacerbation: Secondary | ICD-10-CM

## 2018-01-29 MED ORDER — SILDENAFIL CITRATE 20 MG PO TABS: 20.0000 mg | ORAL_TABLET | Freq: Three times a day (TID) | ORAL | 6 refills | Status: DC | PRN

## 2018-01-29 NOTE — Patient Instructions (Addendum)
    Medication Instructions:   Take asa 81 mg daily  We can start ZETIA 10 mg once a day if LDL runs higher  Labwork:  No new labs needed  Testing/Procedures:  No further testing at this time   Follow-Up: It was a pleasure seeing you in the office today. Please call us if you have new issues that need to be addressed before your next appt.  [phone]  Your physician wants you to follow-up in: 12 months.  You will receive a reminder letter in the mail two months in advance. If you don't receive a letter, please call our office to schedule the follow-up appointment.  If you need a refill on your cardiac medications before your next appointment, please call your pharmacy.  For educational health videos Log in to : [email or web address] Or : [email or web address], password : [email or web address]

## 2018-01-29 NOTE — Progress Notes (Signed)
Cardiology Office Note  Date:  01/29/2018   ID:  [name], DOB Nov 03, 1955, MRN [id number]  PCP:  Cyndi Bender, PA-C   Chief Complaint  Patient presents with  . Other    12 month follow up. Patient c/o SOB and swelling in feet. Meds reviewed verbally with patient.     HPI:  62 year old gentleman with a history of  three vessel CAD,  CABG 01/2002, LIMA to the  LAD, vein graft to the diagonal, vein graft to the OM and vein graft to the distal RCA.  cath 2014 showing patent grafts severe three-vessel disease. hyperlipidemia  hypertension,  smoking history for 23 years who stopped in 2003,   Chronic leukocytosis  followed by oncology  At [facility] , Dr. Phill Myron who presents for routine follow up of his coronary artery disease.   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  Reports that oncology does not want his testosterone high   previously reported some cramping in his legs , unclear if this is from pravastatin In the past he had frequent episodes of sweating, malaise/weakness initially felt to be secondary to bradycardia from metoprolol  previous 30 day monitor in October 2015 given his continued symptoms of sweating, malaise, weakness. Monitor did not show any significant arrhythmia. No  significant bradycardia noted   episode 03/28/2014 where he was working with his tools, sitting, started sweating all over. This happened at 9 AM. His work colleagues sat him in a chair and gave him something to drink. He felt weak for 3 days  catheterization at the end of 2014 for anginal symptoms showing patent grafts severe three-vessel disease. Medical management was recommended.  In recovery following a cardiac catheterization, he had episodes of sweating/flushing, malaise associated with bradycardia. Initially it was felt that this was a vagal response to pressure being applied to his groin. Later in recovery, he continued to have bradycardia. He was told to hold his metoprolol at the time of discharge.  flushing, dizziness, malaise that he was having at home prior to the procedure  resolved.   PMH:   has a past medical history of CAD (coronary artery disease), Chronic fatigue, HLD (hyperlipidemia), HTN (hypertension), and Leukocytosis.  PSH:    Past Surgical History:  Procedure Laterality Date  . APPENDECTOMY    . CORONARY ARTERY BYPASS GRAFT    . HERNIA REPAIR    . NECK SURGERY     x2    Current Outpatient Medications  Medication Sig Dispense Refill  . amLODipine (NORVASC) 10 MG tablet TAKE 1/2-1 TABLET BY MOUTH EVERY DAY 90 tablet 3  . aspirin 81 MG EC tablet Take 162 mg by mouth every morning.     [name] losartan-hydrochlorothiazide (HYZAAR) 100-25 MG tablet TAKE 1 TABLET BY MOUTH DAILY. 90 tablet 3  .  nitroGLYCERIN (NITROSTAT) 0.4 MG SL tablet PLACE 1 TABLET UNDER THE TONGUE EVERY 5 MINUTES AS NEEDED FOR CHEST PAIN FOR UP TO 3 DOSES 25 tablet 0  . pravastatin (PRAVACHOL) 40 MG tablet TAKE 1 TABLET (40 MG TOTAL) BY MOUTH AT BEDTIME. 90 tablet 3  . VIAGRA 100 MG tablet TAKE 1 TABLET BY MOUTH AS NEEDED FOR ERECTILE DYSFUNCTION 7 tablet 1   No current facility-administered medications for this visit.      Allergies:   Codeine; Heparin; Heparin (bovine); Penicillins; and Tape    Social History:  The patient  reports that he quit smoking about 15 years ago. His smoking use included cigarettes. He has a 21.00 pack-year smoking history. He has never used smokeless tobacco. He reports that he drinks about 0.5 oz of alcohol per week. He reports that he does not use drugs.   Family History:   family history includes Heart attack (age of onset: 9) in his father; Heart attack (age of onset: 68) in his mother.    Review of Systems: Review of Systems  Constitutional: Negative.   Respiratory: Negative.   Cardiovascular: Negative.   Gastrointestinal: Negative.   Genitourinary:       Problems with erectile dysfunction  Musculoskeletal: Negative.   Neurological: Negative.   Psychiatric/Behavioral: Negative.   All other systems reviewed and are negative.    PHYSICAL EXAM: VS:  BP 140/66 (BP Location: Left Arm, Patient Position: Sitting, Cuff Size: Normal)   Pulse 61   Ht 5\' 5"  (1.651 m)   Wt 206 lb 12 oz (93.8 kg)   BMI 34.41 kg/m  , BMI Body mass index is 34.41 kg/m.  Constitutional:  oriented to person, place, and time. No distress.  HENT:  Head: Normocephalic and atraumatic.  Eyes:  no discharge. No scleral icterus.  Neck: Normal range of motion. Neck supple. No JVD present.  Cardiovascular: Normal rate, regular rhythm, normal heart sounds and intact distal pulses. Exam reveals no gallop and no friction rub. No edema No murmur heard. Pulmonary/Chest: Effort normal and breath sounds normal. No stridor. No respiratory distress.  no wheezes.  no rales.  no tenderness.  Abdominal: Soft.  no distension.  no tenderness.  Musculoskeletal: Normal range of motion.  no  tenderness or deformity.  Neurological:  normal muscle tone. Coordination normal. No atrophy Skin: Skin is warm and dry. No rash noted. not diaphoretic.  Psychiatric:  normal mood and affect. behavior is normal. Thought content normal.    Recent Labs: No results found for requested labs within  last 8760 hours.    Lipid Panel Lab Results  Component Value Date   CHOL 134 07/30/2012   HDL 46 07/30/2012   LDLCALC 61 07/30/2012   TRIG 135 07/30/2012      Wt Readings from Last 3 Encounters:  01/29/18 206 lb 12 oz (93.8 kg)  01/30/17 203 lb 8 oz (92.3 kg)  08/01/16 211 lb 4 oz (95.8 kg)       ASSESSMENT AND PLAN:  HYPERTENSION, BENIGN - Plan: EKG 12-Lead Blood pressure is well controlled on today's visit. No changes made to the medications. stable  Atherosclerosis of coronary artery bypass graft of native heart without angina pectoris - Plan: EKG 12-Lead Currently with no symptoms of angina. No further workup at this time. Continue current medication regimen.  Mixed hyperlipidemia He indicates he is taking pravastatin every other day Most recent LDL 60 If numbers climb would take pravastatin daily or add Zetia Reports he was having myalgias  Erectile dysfunction, unspecified erectile dysfunction type Generic Viagra refilled at his request    Total encounter time more than 25 minutes  Greater than 50% was spent in counseling and coordination of care with the patient  Disposition:   F/U  12 months   No orders of the defined types were placed in this encounter.    Signed, Esmond Plants, [name], Ph.D. 01/29/2018  [place], [place]

## 2018-02-08 ENCOUNTER — Telehealth: Payer: Self-pay | Admitting: Cardiovascular Disease

## 2018-02-08 NOTE — Telephone Encounter (Signed)
Patient wife calling to let us know [facility] [place] cannot fill sildenafil without a prior auth   Please call to discuss.

## 2018-02-08 NOTE — Telephone Encounter (Signed)
Spoke with patient at length regarding the prescription. Advised that [facility] site shows cash price $14.73-19.00 for this medication without insurance. She seemed confused with this information so reassured her that if request for PA comes in we will complete it. She verbalized understanding with no further questions at this time.

## 2018-03-12 ENCOUNTER — Other Ambulatory Visit: Payer: Self-pay | Admitting: Ophthalmology

## 2018-03-12 DIAGNOSIS — G43109 Migraine with aura, not intractable, without status migrainosus: Secondary | ICD-10-CM

## 2018-03-12 DIAGNOSIS — D3131 Benign neoplasm of right choroid: Secondary | ICD-10-CM | POA: Diagnosis not present

## 2018-03-23 ENCOUNTER — Telehealth: Payer: Self-pay

## 2018-03-23 NOTE — Telephone Encounter (Signed)
Ms. Cantara has called two times to verify that it is ok for Mr. Gullickson to have an MRI with sternal wires.  He is s/p CABG in 2003 done by Dr. Servando Snare.  After she contacted the office a second time, I advised her that I would speak with Dr. Servando Snare directly and verify that he could indeed go through an MRI machine with sternal wires.  After speaking with Dr. Servando Snare, she was again told that he could have an MRI done and is completely safe doing so.  She thanked me for the response and no other questions were asked.

## 2018-03-25 ENCOUNTER — Ambulatory Visit
Admission: RE | Admit: 2018-03-25 | Discharge: 2018-03-25 | Disposition: A | Discharge status: Home / Self Care | Payer: 59 | Source: Ambulatory Visit | Attending: Ophthalmology | Admitting: Ophthalmology

## 2018-03-25 DIAGNOSIS — J349 Unspecified disorder of nose and nasal sinuses: Secondary | ICD-10-CM | POA: Insufficient documentation

## 2018-03-25 DIAGNOSIS — G43109 Migraine with aura, not intractable, without status migrainosus: Secondary | ICD-10-CM | POA: Insufficient documentation

## 2018-03-25 DIAGNOSIS — G43909 Migraine, unspecified, not intractable, without status migrainosus: Secondary | ICD-10-CM | POA: Diagnosis not present

## 2018-03-25 LAB — POCT I-STAT CREATININE: Creatinine, Ser: 0.7 mg/dL (ref 0.61–1.24)

## 2018-03-25 MED ORDER — GADOBENATE DIMEGLUMINE 529 MG/ML IV SOLN
20.0000 mL | Freq: Once | INTRAVENOUS | Status: AC | PRN
  Administered 2018-03-25: 19 mL via INTRAVENOUS

## 2018-04-25 DIAGNOSIS — J069 Acute upper respiratory infection, unspecified: Secondary | ICD-10-CM | POA: Diagnosis not present

## 2018-05-21 DIAGNOSIS — J441 Chronic obstructive pulmonary disease with (acute) exacerbation: Secondary | ICD-10-CM | POA: Diagnosis not present

## 2018-05-21 DIAGNOSIS — Z6831 Body mass index (BMI) 31.0-31.9, adult: Secondary | ICD-10-CM | POA: Diagnosis not present

## 2018-05-28 ENCOUNTER — Telehealth: Payer: Self-pay | Admitting: Cardiovascular Disease

## 2018-05-28 NOTE — Telephone Encounter (Signed)
Pt wife is calling states he would like to be switched from Losartan and would not like Valsartan.

## 2018-05-28 NOTE — Telephone Encounter (Signed)
I spoke with the patient's wife (ok per DPR). She states the patient did get a letter from the pharmacy that his particular losartan has been recalled. They are requesting an alternative to this and do not want valsartan as similar issues are starting with this drug.   I advised Mrs. Fults that I would forward the message to Dr. Rockey Situ for review and call her back with his recommendations. She voices understanding and is agreeable.  She [facility] [place].

## 2018-05-29 ENCOUNTER — Other Ambulatory Visit: Payer: Self-pay | Admitting: Cardiovascular Disease

## 2018-05-29 NOTE — Telephone Encounter (Signed)
We could stop losartan hctz combo pill Start hctz 25 And start irbesartan 150 mg daily

## 2018-05-31 MED ORDER — HYDROCHLOROTHIAZIDE 25 MG PO TABS: 25.0000 mg | ORAL_TABLET | Freq: Every day | ORAL | 1 refills | Status: DC

## 2018-05-31 MED ORDER — IRBESARTAN 150 MG PO TABS: 150.0000 mg | ORAL_TABLET | Freq: Every day | ORAL | 1 refills | Status: DC

## 2018-05-31 NOTE — Telephone Encounter (Signed)
Advised wife of changes, verbalized understanding. [facility]

## 2018-06-03 DIAGNOSIS — R61 Generalized hyperhidrosis: Secondary | ICD-10-CM | POA: Diagnosis not present

## 2018-06-03 DIAGNOSIS — R05 Cough: Secondary | ICD-10-CM | POA: Diagnosis not present

## 2018-06-03 DIAGNOSIS — J441 Chronic obstructive pulmonary disease with (acute) exacerbation: Secondary | ICD-10-CM | POA: Diagnosis not present

## 2018-06-03 DIAGNOSIS — Z6831 Body mass index (BMI) 31.0-31.9, adult: Secondary | ICD-10-CM | POA: Diagnosis not present

## 2018-06-18 DIAGNOSIS — Z6832 Body mass index (BMI) 32.0-32.9, adult: Secondary | ICD-10-CM | POA: Diagnosis not present

## 2018-06-18 DIAGNOSIS — I2510 Atherosclerotic heart disease of native coronary artery without angina pectoris: Secondary | ICD-10-CM | POA: Diagnosis not present

## 2018-06-18 DIAGNOSIS — J449 Chronic obstructive pulmonary disease, unspecified: Secondary | ICD-10-CM | POA: Diagnosis not present

## 2018-07-16 DIAGNOSIS — J209 Acute bronchitis, unspecified: Secondary | ICD-10-CM | POA: Diagnosis not present

## 2018-07-16 DIAGNOSIS — J111 Influenza due to unidentified influenza virus with other respiratory manifestations: Secondary | ICD-10-CM | POA: Diagnosis not present

## 2018-07-16 DIAGNOSIS — R509 Fever, unspecified: Secondary | ICD-10-CM | POA: Diagnosis not present

## 2018-07-27 ENCOUNTER — Telehealth: Payer: Self-pay | Admitting: Cardiovascular Disease

## 2018-07-27 NOTE — Telephone Encounter (Signed)
Called and s/w patient directly. States he's been sick with pneumonia and COPD since the end of August. States at PCP last week his pneumonia had finally cleared. He has not had anymore episodes like he had on Thanksgiving day since that day. He works outside and has noticed he gets more short of breath than usual since having this sickness. He's still coughing up white mucous. Denies jaw or left arm pain. Gets nauseas when he coughs up a lot of sputum. He has not been seen by Dr Rockey Situ since June of this year and would like to see him. He denied appointments with APP's for tomorrow and would rather wait a week and see Dr Rockey Situ on 08/03/18. Patient scheduled. Pt verbalized understanding to call 911 or go to the emergency room, if he develops any new or worsening symptoms. He states if it happens again or he feels worse he will go to the ED. He also said he will call PCP to be worked in sometime this week if possible to re-evaluate his recent respiratory illness.

## 2018-07-27 NOTE — Telephone Encounter (Signed)
Called and s/w patient's wife, ok per DPR. Patient sees PCP Dr Cyndi Bender in [place], [place]. Been treated for pneumonia and bronchitis over the past month of so.  Has completed 2 rounds of Levaquin and steroids. Has had a terrible cough per wife. Sometimes gets so much phlegm while coughing that it makes him nauseas and almost throws up.  Reason for calling us is that patient had episode of chest pressure and breaking out in a sweat followed by weakness on Thanksgiving day. It lasted about 15-20 min and was so bad patient could not drive. She says her husband is at work today and called her to ask her to call our office because he was not feeling well. Wife asked me to call patient to see if I could get any other information from patient directly because he does not usually tell her everything if he is not feeling well.

## 2018-07-27 NOTE — Telephone Encounter (Signed)
Pt wife states pt is having "heart spasm" , but pt has also had pneumonia in November. States he also sweats and just doesn't fell very well for a period of time. Please call to discuss.

## 2018-08-02 DIAGNOSIS — J449 Chronic obstructive pulmonary disease, unspecified: Secondary | ICD-10-CM | POA: Diagnosis not present

## 2018-08-02 NOTE — Progress Notes (Signed)
Cardiology Office Note  Date:  08/03/2018   ID:  [name], [name] Sep 01, 1955, MRN [id number]  PCP:  Cyndi Bender, PA-C   Chief Complaint  Patient presents with  . other    12 month follow up. Meds reviewed by the pt. verbally. Pt. c/o Thanksgiving day having a spell of breaking out into a sweat with feeling dizzy, weak and fatigue and was having muscle spasms and now has shortness of breath.      HPI:  62 year old gentleman with a history of  three vessel CAD,  CABG 01/2002, LIMA to the  LAD, vein graft to the diagonal, vein graft to the OM and vein graft to the distal RCA.  cath 2014 showing patent grafts severe three-vessel disease. hyperlipidemia  hypertension,  smoking history for 23 years who stopped in 2003,   Chronic leukocytosis  followed by oncology  At [facility] , Dr. Phill Myron who presents for routine follow up of his coronary artery disease.   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, debating 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  Reports that oncology does not want his testosterone high   previously reported some cramping in his legs , unclear if this is from pravastatin In the past he had frequent episodes of sweating, malaise/weakness initially felt to be secondary to bradycardia from metoprolol  previous 30 day monitor in October 2015 given his continued symptoms of sweating, malaise, weakness. Monitor did not show any significant arrhythmia. No significant bradycardia noted   episode 03/28/2014 where he was working with his tools, sitting, started sweating all over. This happened at 9 AM. His work colleagues sat him in a chair and gave him something to drink. He felt weak for 3 days  catheterization at the end of 2014 for anginal symptoms showing patent grafts severe three-vessel disease. Medical management was recommended.  In recovery following a cardiac catheterization, he had episodes of sweating/flushing, malaise associated with bradycardia. Initially it was felt that this was a vagal response to pressure being applied to his groin. Later in recovery, he continued to have bradycardia. He was told to hold his metoprolol at the time of discharge.  flushing, dizziness, malaise that he was having at home prior to the procedure  resolved.   PMH:   has a past medical history of CAD (coronary artery disease), Chronic fatigue, HLD (hyperlipidemia), HTN (hypertension), and  Leukocytosis.  PSH:    Past Surgical History:  Procedure Laterality Date  . APPENDECTOMY    . CORONARY ARTERY BYPASS GRAFT    . HERNIA REPAIR    . NECK SURGERY     x2    Current Outpatient Medications  Medication Sig Dispense Refill  . amLODipine (NORVASC) 10 MG tablet TAKE 1/2-1 TABLET BY MOUTH EVERY DAY 90 tablet 3  . aspirin 81 MG EC tablet Take 81 mg by mouth  every morning.     . hydrochlorothiazide (HYDRODIURIL) 25 MG tablet Take 1 tablet (25 mg total) by mouth daily. 90 tablet 1  . irbesartan (AVAPRO) 150 MG tablet Take 1 tablet (150 mg total) by mouth daily. 90 tablet 1  . nitroGLYCERIN (NITROSTAT) 0.4 MG SL tablet PLACE 1 TABLET UNDER THE TONGUE EVERY 5 MINUTES AS NEEDED FOR CHEST PAIN FOR UP TO 3 DOSES 25 tablet 0  . pravastatin (PRAVACHOL) 40 MG tablet TAKE 1 TABLET (40 MG TOTAL) BY MOUTH AT BEDTIME. 90 tablet 3  . sildenafil (REVATIO) 20 MG tablet Take 1 tablet (20 mg total) by mouth 3 (three) times daily as needed. 90 tablet 6   No current facility-administered medications for this visit.      Allergies:   Codeine; Heparin; Heparin (bovine); Penicillins; and Tape   Social History:  The patient  reports that he quit smoking about 16 years ago. His smoking use included cigarettes. He has a 21.00 pack-year smoking history. He has never used smokeless tobacco. He reports that he drinks about 1.0 standard drinks of alcohol per week. He reports that he does not use drugs.   Family History:   family history includes Heart attack (age of onset: 19) in his father; Heart attack (age of onset: 31) in his mother.    Review of Systems: Review of Systems  Constitutional: Negative.   Respiratory: Negative.   Cardiovascular: Negative.   Gastrointestinal: Negative.   Genitourinary:       Problems with erectile dysfunction  Musculoskeletal: Negative.   Neurological: Negative.   Psychiatric/Behavioral: Negative.   All other systems reviewed and are negative.    PHYSICAL EXAM: VS:  BP 126/78 (BP Location: Left Arm, Patient Position: Sitting, Cuff Size: Normal)   Pulse 79   Ht 5\' 6"  (1.676 m)   Wt 199 lb 12 oz (90.6 kg)   BMI 32.24 kg/m  , BMI Body mass index is 32.24 kg/m. Constitutional:  oriented to person, place, and time. No distress.  HENT:  Head: Grossly normal Eyes:  no discharge. No scleral icterus.  Neck: No JVD, no carotid  bruits  Cardiovascular: Regular rate and rhythm, no murmurs appreciated Pulmonary/Chest: Clear to auscultation bilaterally, no wheezes or rails Abdominal: Soft.  no distension.  no tenderness.  Musculoskeletal: Normal range of motion Neurological:  normal muscle tone. Coordination normal. No atrophy Skin: Skin warm and dry Psychiatric: normal affect, pleasant   Recent Labs: 03/25/2018: Creatinine, Ser 0.70    Lipid Panel Lab Results  Component Value Date   CHOL 134 07/30/2012   HDL 46 07/30/2012   LDLCALC 61 07/30/2012   TRIG 135 07/30/2012      Wt Readings from Last 3 Encounters:  08/03/18 199 lb 12 oz (90.6 kg)  01/29/18 206 lb 12 oz (93.8 kg)  01/30/17 203 lb 8 oz (92.3 kg)       ASSESSMENT AND PLAN:  HYPERTENSION, BENIGN - Plan: EKG 12-Lead Blood pressure stable, no changes made  Atherosclerosis of coronary artery bypass graft  of native heart without angina pectoris Long discussion concerning his right-sided chest pain 1 month ago Atypical in nature, no further exertional symptoms noted No further work-up ordered  Mixed hyperlipidemia Recommend he take his medications daily Previously taking pravastatin every other day Labs done through primary care  Diaphoresis spells Consistent most likely with vasovagal near syncope Did not measure pulse or blood pressure Similar episode post catheterization on groin hold Monitor at that time showed no arrhythmia Recommended if he has additional spells that he call our office,  Would also lay supine position if he does get dizzy  Erectile dysfunction, unspecified erectile dysfunction type Generic Viagra refilled at his request    Total encounter time more than 25 minutes  Greater than 50% was spent in counseling and coordination of care with the patient  Disposition:   F/U  12 months   Orders Placed This Encounter  Procedures  . EKG 12-Lead     Signed, Esmond Plants, [name], Ph.D. 08/03/2018  [place], [place]

## 2018-08-03 ENCOUNTER — Encounter: Payer: Self-pay | Admitting: Cardiovascular Disease

## 2018-08-03 ENCOUNTER — Ambulatory Visit: Payer: 59 | Admitting: Cardiovascular Disease

## 2018-08-03 VITALS — BP 126/78 | HR 79 | Ht 66.0 in | Wt 199.8 lb

## 2018-08-03 DIAGNOSIS — J441 Chronic obstructive pulmonary disease with (acute) exacerbation: Secondary | ICD-10-CM | POA: Diagnosis not present

## 2018-08-03 DIAGNOSIS — I25708 Atherosclerosis of coronary artery bypass graft(s), unspecified, with other forms of angina pectoris: Secondary | ICD-10-CM

## 2018-08-03 DIAGNOSIS — E782 Mixed hyperlipidemia: Secondary | ICD-10-CM

## 2018-08-03 DIAGNOSIS — R0602 Shortness of breath: Secondary | ICD-10-CM

## 2018-08-03 DIAGNOSIS — I10 Essential (primary) hypertension: Secondary | ICD-10-CM | POA: Diagnosis not present

## 2018-08-03 DIAGNOSIS — R61 Generalized hyperhidrosis: Secondary | ICD-10-CM

## 2018-08-03 NOTE — Patient Instructions (Signed)

## 2018-09-01 ENCOUNTER — Other Ambulatory Visit: Payer: Self-pay | Admitting: Cardiovascular Disease

## 2018-09-03 DIAGNOSIS — Z6832 Body mass index (BMI) 32.0-32.9, adult: Secondary | ICD-10-CM | POA: Diagnosis not present

## 2018-09-03 DIAGNOSIS — J449 Chronic obstructive pulmonary disease, unspecified: Secondary | ICD-10-CM | POA: Diagnosis not present

## 2018-09-24 DIAGNOSIS — H2513 Age-related nuclear cataract, bilateral: Secondary | ICD-10-CM | POA: Diagnosis not present

## 2018-10-25 ENCOUNTER — Other Ambulatory Visit: Payer: Self-pay | Admitting: Cardiovascular Disease

## 2018-11-25 ENCOUNTER — Other Ambulatory Visit: Payer: Self-pay

## 2018-11-25 ENCOUNTER — Other Ambulatory Visit: Payer: Self-pay | Admitting: Cardiovascular Disease

## 2018-11-25 MED ORDER — AMLODIPINE BESYLATE 10 MG PO TABS: ORAL_TABLET | ORAL | 3 refills | Status: DC

## 2018-11-25 NOTE — Telephone Encounter (Signed)
Refill sent for amlodipine 10 mg take 1/2 tablet to 1 by mouth daily.

## 2018-11-26 ENCOUNTER — Other Ambulatory Visit: Payer: Self-pay

## 2018-11-26 MED ORDER — IRBESARTAN 150 MG PO TABS: 150.0000 mg | ORAL_TABLET | Freq: Every day | ORAL | 3 refills | Status: DC

## 2018-11-26 MED ORDER — HYDROCHLOROTHIAZIDE 25 MG PO TABS: 25.0000 mg | ORAL_TABLET | Freq: Every day | ORAL | 3 refills | Status: DC

## 2018-11-26 MED ORDER — AMLODIPINE BESYLATE 10 MG PO TABS: ORAL_TABLET | ORAL | 3 refills | Status: DC

## 2018-11-26 NOTE — Telephone Encounter (Signed)
*  STAT* If patient is at the pharmacy, call can be transferred to refill team.   1. Which medications need to be refilled? (please list name of each medication and dose if known) Amlodipine, Hydrochlorothiazide, Irbesartan  2. Which pharmacy/location (including street and city if local pharmacy) is medication to be sent to?*[facility]  3. Do they need a 30 day or 90 day supply? [place]

## 2018-11-29 ENCOUNTER — Telehealth: Payer: Self-pay | Admitting: Cardiovascular Disease

## 2018-11-29 NOTE — Telephone Encounter (Signed)
Please call regarding Irbesartan. Pt wife has some questions. States pt is unable to get this medication.

## 2018-11-29 NOTE — Telephone Encounter (Signed)
could change to losartan 50 daily Or valsartan 160 daily

## 2018-11-29 NOTE — Telephone Encounter (Signed)
Spoke with the pt wife. The pt has not been able to get his [facility]. The pharmacy has searched a 25 mile radius.  [place] [facility] [place] [facility] a [place] through. I offered to send the script to another [facility] besides [facility], but the pt wife declined.  Adv her that I will fwd the message to Dr. Rockey Situ and adv on an alternative due to Irbesartan not being available. Pt wife rqst to be called and the [facility] [place].

## 2018-11-30 MED ORDER — PRAVASTATIN SODIUM 40 MG PO TABS: 40.0000 mg | ORAL_TABLET | Freq: Every day | ORAL | 2 refills | Status: DC

## 2018-11-30 MED ORDER — VALSARTAN 160 MG PO TABS: 160.0000 mg | ORAL_TABLET | Freq: Every day | ORAL | 2 refills | Status: DC

## 2018-11-30 NOTE — Telephone Encounter (Signed)
Called and s/w wife. She preferred I call in the valsartan to replace the irbesartan as they have heard a lot about recalls for losartan. She also asked I send in 90-day supplies for any other cardiac meds not already on the rotation.  Rx sent to pharmacy.

## 2018-11-30 NOTE — Telephone Encounter (Signed)
Patient wife calling to check status of medication

## 2018-12-30 ENCOUNTER — Other Ambulatory Visit: Payer: Self-pay | Admitting: Cardiovascular Disease

## 2018-12-30 DIAGNOSIS — I10 Essential (primary) hypertension: Secondary | ICD-10-CM

## 2018-12-30 DIAGNOSIS — E782 Mixed hyperlipidemia: Secondary | ICD-10-CM

## 2018-12-30 MED ORDER — VALSARTAN 80 MG PO TABS: 80.0000 mg | ORAL_TABLET | Freq: Two times a day (BID) | ORAL | 0 refills | Status: DC

## 2018-12-30 NOTE — Telephone Encounter (Signed)
Please have patient take 2 valsartan 80mg  pills  (2 pills daily, for a total of 160mg  valsartan daily), given the valsartan 160mg  is on back order.   [facility] please let me know if any additional issues.

## 2018-12-30 NOTE — Telephone Encounter (Signed)
Called patient.  Made them aware that Valsartan 160MG  is currently on back order.  Asked if there was another pharmacy that is close by that I could call to see if they had patients corrct dose  [facility]  Pharmacy is requesting that we send in Valsartan 80 MG.  Please advise.

## 2018-12-31 MED ORDER — VALSARTAN 160 MG PO TABS: 160.0000 mg | ORAL_TABLET | Freq: Every day | ORAL | 6 refills | Status: DC

## 2018-12-31 NOTE — Addendum Note (Signed)
Addended by: Verlon Au on: 12/31/2018 04:06 PM   Modules accepted: Orders

## 2018-12-31 NOTE — Telephone Encounter (Signed)
Call to patient to discuss medication concern. Pt had been prescribed Valsartan (80 mg) BID to get intended dose of 160 mg.   Family unable to afford due to increase cost of "doubling medication" d/t shortages bc insurance is "charging double".   Wife wanting to know if there is a more affordable option.   Routed to provider to advise.

## 2018-12-31 NOTE — Telephone Encounter (Signed)
Pt c/o medication issue:  1. Name of Medication: Valsartan   2. How are you currently taking this medication (dosage and times per day)? 80 BD   3. Are you having a reaction (difficulty breathing--STAT)? No   4. What is your medication issue? Recent med change not affordable please call wife to discuss options

## 2018-12-31 NOTE — Telephone Encounter (Signed)
It should be more affordable if we just did 160mg  daily? Not sure why it was called in w/ 80mg  tabs. If they'd prefer an alternative, losartan is the most affordable, and could do 50mg  daily (I saw note aoubt their concern re: recalls, though pharmacies have been on top of this for some time now).

## 2018-12-31 NOTE — Telephone Encounter (Signed)
Returned call to patient. Rx changed to 160 mg, 1 tablet, once daily.   She has supply for this month but will attempt to fill new Rx for 160 mg next month and will, hopefully, be available.   Pt appreciative of assistance.   Advised pt to call for any further questions or concerns.

## 2019-03-04 ENCOUNTER — Telehealth: Payer: Self-pay | Admitting: Cardiovascular Disease

## 2019-03-04 NOTE — Telephone Encounter (Signed)
Returned pt wife call. [email or web address].

## 2019-03-04 NOTE — Telephone Encounter (Signed)
Patient and spouse called in and would like to go over medications, there is some confusion with some changes. Please advise

## 2019-03-09 NOTE — Telephone Encounter (Signed)
Spoke with wife.  Went over all Pt's medications.  Wife would be interested in changing back to irbesartan when Pt's current prescription of valsartan runs out (just filled for 3 mo supply).  Advised if she wanted to change back to irbesartan she probably could IF she was willing to change to another pharmacy.  Wife is willing to change now.  Pt's wife will call office when sooner to needing refills.  She will have new pharmacy information available when she calls.

## 2019-04-11 ENCOUNTER — Other Ambulatory Visit: Payer: Self-pay | Admitting: Cardiovascular Disease

## 2019-04-11 MED ORDER — IRBESARTAN 150 MG PO TABS: 150.0000 mg | ORAL_TABLET | Freq: Every day | ORAL | 0 refills | Status: DC

## 2019-04-11 NOTE — Telephone Encounter (Signed)
Patients wife is calling in with a request to fill irbesartan  rather then valsartan. Patients wife states it is too expensive and would like to change. Please advise    *STAT* If patient is at the pharmacy, call can be transferred to refill team.   1. Which medications need to be refilled? (please list name of each medication and dose if known) irbesartan  2. Which pharmacy/location (including street and city if local pharmacy) is medication to be sent to? [facility]  3. Do they need a 30 day or 90 day supply? [place]

## 2019-04-11 NOTE — Telephone Encounter (Signed)
Requested Prescriptions   Signed Prescriptions Disp Refills  . irbesartan (AVAPRO) 150 MG tablet 90 tablet 0    Sig: Take 1 tablet (150 mg total) by mouth daily.    Authorizing Provider: Minna Merritts    Ordering User: Raelene Bott, Liliahna Cudd L

## 2019-04-12 ENCOUNTER — Other Ambulatory Visit: Payer: Self-pay

## 2019-04-12 MED ORDER — IRBESARTAN 150 MG PO TABS: 150.0000 mg | ORAL_TABLET | Freq: Every day | ORAL | 3 refills | Status: DC

## 2019-09-26 NOTE — Progress Notes (Signed)
Cardiology Office Note  Date:  09/27/2019   ID:  Deforest, Floerke 1955/12/21, MRN [id number]  PCP:  Cyndi Bender, PA-C   Chief Complaint  Patient presents with  . office visit    Pt 12 month f/u. No complaints. Meds verbally reviewed w/ pt.    HPI:  64 year old gentleman with a history of  three vessel CAD,  CABG 01/2002, LIMA to the LAD, vein graft to the diagonal, vein graft to the OM and vein graft to the distal RCA.  cath 2014 showing patent grafts severe three-vessel disease. hyperlipidemia  hypertension,  smoking history for 23 years who stopped in 2003,   Chronic leukocytosis  followed by oncology  At [facility] , Dr. Phill Myron who presents for routine follow up of his coronary artery disease.   Overall doing well, needs pfreop evaluation BP [id number] systolic Elevated today, did not take Am meds  Labs through PMD, schedule in the next month  Needs surgery, inguinal hernia Had it for years Scheduled in [place], Dr. 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  Reports that oncology does not want his testosterone high   previously reported some cramping in his legs , unclear if this is from pravastatin In the past he had frequent episodes of sweating, malaise/weakness initially felt to be secondary to bradycardia from metoprolol  previous 30 day monitor in October 2015 given his continued symptoms of sweating, malaise, weakness. Monitor  did not show any significant arrhythmia. No significant bradycardia noted   episode 03/28/2014 where he was working with his tools, sitting, started sweating all over. This happened at 9 AM. His work colleagues sat him in a chair and gave him something to drink. He felt weak for 3 days  catheterization at the end of 2014 for anginal symptoms showing patent grafts severe three-vessel disease. Medical management was recommended.  In recovery following a cardiac catheterization, he had episodes of sweating/flushing, malaise associated with bradycardia. Initially it was felt that this was a vagal response to pressure being applied to his groin. Later in recovery, he continued to have bradycardia. He was told to hold his metoprolol at the time of discharge.  flushing, dizziness, malaise that he was having at home prior to the procedure  resolved.   PMH:   has a past medical history of CAD (coronary artery disease), Chronic fatigue, HLD (hyperlipidemia), HTN (hypertension), and Leukocytosis.  PSH:    Past Surgical History:  Procedure Laterality Date  . APPENDECTOMY    . CORONARY ARTERY BYPASS GRAFT    . HERNIA REPAIR    . NECK SURGERY     x2    Current Outpatient Medications  Medication Sig Dispense Refill  . amLODipine (NORVASC) 10 MG tablet Take 1/2 to 1 tablet by mouth every day. 90 tablet 3  . aspirin 81 MG EC tablet Take 81 mg by mouth every morning.     . hydrochlorothiazide (HYDRODIURIL) 25  MG tablet Take 1 tablet (25 mg total) by mouth daily. 90 tablet 3  . irbesartan (AVAPRO) 150 MG tablet Take 1 tablet (150 mg total) by mouth daily. 90 tablet 3  . nitroGLYCERIN (NITROSTAT) 0.4 MG SL tablet PLACE 1 TABLET UNDER THE TONGUE EVERY 5 MINUTES AS NEEDED FOR CHEST PAIN FOR UP TO 3 DOSES 25 tablet 0  . pravastatin (PRAVACHOL) 40 MG tablet Take 1 tablet (40 mg total) by mouth at bedtime. 90 tablet 2  . SPIRIVA HANDIHALER 18 MCG inhalation capsule 1 capsule daily.     No current  facility-administered medications for this visit.    Allergies:   Codeine, Heparin, Heparin (bovine), Penicillins, and Tape   Social History:  The patient  reports that he quit smoking about 17 years ago. His smoking use included cigarettes. He has a 21.00 pack-year smoking history. He has never used smokeless tobacco. He reports current alcohol use of about 1.0 standard drinks of alcohol per week. He reports that he does not use drugs.   Family History:   family history includes Heart attack (age of onset: 32) in his father; Heart attack (age of onset: 24) in his mother.    Review of Systems: Review of Systems  Constitutional: Negative.   Respiratory: Negative.   Cardiovascular: Negative.   Gastrointestinal: Negative.   Genitourinary:       Problems with erectile dysfunction  Musculoskeletal: Negative.   Neurological: Negative.   Psychiatric/Behavioral: Negative.   All other systems reviewed and are negative.    PHYSICAL EXAM: VS:  BP (!) 152/84 (BP Location: Left Arm, Patient Position: Sitting, Cuff Size: Normal)   Ht 5\' 6"  (1.676 m)   Wt 199 lb 8 oz (90.5 kg)   SpO2 96%   BMI 32.20 kg/m  , BMI Body mass index is 32.2 kg/m. Constitutional:  oriented to person, place, and time. No distress.  HENT:  Head: Grossly normal Eyes:  no discharge. No scleral icterus.  Neck: No JVD, no carotid bruits  Cardiovascular: Regular rate and rhythm, no murmurs appreciated Pulmonary/Chest: Clear to auscultation bilaterally, no wheezes or rails Abdominal: Soft.  no distension.  no tenderness.  Musculoskeletal: Normal range of motion Neurological:  normal muscle tone. Coordination normal. No atrophy Skin: Skin warm and dry Psychiatric: normal affect, pleasant    Recent Labs: No results found for requested labs within last 8760 hours.    Lipid Panel Lab Results  Component Value Date   CHOL 134 07/30/2012   HDL 46 07/30/2012   LDLCALC 61 07/30/2012   TRIG 135 07/30/2012       Wt Readings from Last 3 Encounters:  09/27/19 199 lb 8 oz (90.5 kg)  08/03/18 199 lb 12 oz (90.6 kg)  01/29/18 206 lb 12 oz (93.8 kg)     ASSESSMENT AND PLAN:  Preop cardiovascular Acceptable risk for surgery, inguinal hernia repair No further testing needed Surgery to be performed in [place], BENIGN - Plan: EKG 12-Lead Blood pressure elevated today, better at home No changes made to the medications.  Atherosclerosis of coronary artery bypass graft of native heart without angina pectoris Currently with no symptoms of angina. No further workup at this time. Continue current medication regimen.  Mixed hyperlipidemia PMD to do lab works   Total encounter time more than 25 minutes  Greater than 50% was spent in counseling and coordination of care with the patient  Disposition:   F/U  12 months   No orders of the defined types  were placed in this encounter.    Signed, Esmond Plants, [name], Ph.D. 09/27/2019  [place], [place]

## 2019-09-27 ENCOUNTER — Encounter: Payer: Self-pay | Admitting: Cardiovascular Disease

## 2019-09-27 ENCOUNTER — Encounter: Payer: Self-pay | Admitting: *Deleted

## 2019-09-27 ENCOUNTER — Ambulatory Visit (INDEPENDENT_AMBULATORY_CARE_PROVIDER_SITE_OTHER): Payer: 59 | Admitting: Cardiovascular Disease

## 2019-09-27 ENCOUNTER — Other Ambulatory Visit: Payer: Self-pay

## 2019-09-27 VITALS — BP 152/84 | Ht 66.0 in | Wt 199.5 lb

## 2019-09-27 DIAGNOSIS — I10 Essential (primary) hypertension: Secondary | ICD-10-CM | POA: Diagnosis not present

## 2019-09-27 DIAGNOSIS — R0602 Shortness of breath: Secondary | ICD-10-CM

## 2019-09-27 DIAGNOSIS — J441 Chronic obstructive pulmonary disease with (acute) exacerbation: Secondary | ICD-10-CM | POA: Diagnosis not present

## 2019-09-27 DIAGNOSIS — E782 Mixed hyperlipidemia: Secondary | ICD-10-CM

## 2019-09-27 DIAGNOSIS — R61 Generalized hyperhidrosis: Secondary | ICD-10-CM

## 2019-09-27 DIAGNOSIS — I25708 Atherosclerosis of coronary artery bypass graft(s), unspecified, with other forms of angina pectoris: Secondary | ICD-10-CM | POA: Diagnosis not present

## 2019-09-27 NOTE — Patient Instructions (Signed)

## 2019-10-04 ENCOUNTER — Telehealth: Payer: Self-pay | Admitting: Cardiovascular Disease

## 2019-10-04 NOTE — Telephone Encounter (Signed)
Patient wife called, would like to have the clearance letter faxed to Dr. Kendell Bane [phone]. Printed and faxed letter to Dr. Amalia Hailey office

## 2019-11-28 ENCOUNTER — Other Ambulatory Visit: Payer: Self-pay | Admitting: Cardiovascular Disease

## 2020-01-16 ENCOUNTER — Other Ambulatory Visit: Payer: Self-pay | Admitting: Cardiovascular Disease

## 2020-02-01 ENCOUNTER — Telehealth: Payer: Self-pay | Admitting: Cardiovascular Disease

## 2020-02-01 NOTE — Telephone Encounter (Signed)
Spoke with patients wife per release form and she states that he is having some urological pain and they advised he should alternate tylenol and advil. She wanted to make sure this was safe to do and encouraged that he eat with the advil. She verbalized understanding with no further questions at this time.

## 2020-02-01 NOTE — Telephone Encounter (Signed)
Patient spouse calling Patient in a lot of pain - would like to know if it would be ok to start taking Advil again Please call to discuss

## 2020-03-08 ENCOUNTER — Encounter: Payer: Self-pay | Admitting: Ophthalmology

## 2020-03-08 ENCOUNTER — Other Ambulatory Visit: Payer: Self-pay

## 2020-03-16 NOTE — Discharge Instructions (Signed)

## 2020-03-20 ENCOUNTER — Encounter: Payer: Self-pay | Admitting: Ophthalmology

## 2020-03-20 ENCOUNTER — Encounter
Admission: RE | Disposition: A | Discharge status: Home / Self Care | Payer: Self-pay | Source: Home / Self Care | Attending: Ophthalmology

## 2020-03-20 ENCOUNTER — Ambulatory Visit
Admission: RE | Admit: 2020-03-20 | Discharge: 2020-03-20 | Disposition: A | Discharge status: Home / Self Care | Payer: 59 | Attending: Ophthalmology | Admitting: Ophthalmology

## 2020-03-20 ENCOUNTER — Other Ambulatory Visit: Payer: Self-pay

## 2020-03-20 ENCOUNTER — Ambulatory Visit: Payer: 59 | Admitting: Anesthesiology

## 2020-03-20 DIAGNOSIS — E7800 Pure hypercholesterolemia, unspecified: Secondary | ICD-10-CM | POA: Insufficient documentation

## 2020-03-20 DIAGNOSIS — I2510 Atherosclerotic heart disease of native coronary artery without angina pectoris: Secondary | ICD-10-CM | POA: Diagnosis not present

## 2020-03-20 DIAGNOSIS — Z951 Presence of aortocoronary bypass graft: Secondary | ICD-10-CM | POA: Insufficient documentation

## 2020-03-20 DIAGNOSIS — I110 Hypertensive heart disease with heart failure: Secondary | ICD-10-CM | POA: Insufficient documentation

## 2020-03-20 DIAGNOSIS — I509 Heart failure, unspecified: Secondary | ICD-10-CM | POA: Diagnosis not present

## 2020-03-20 DIAGNOSIS — J449 Chronic obstructive pulmonary disease, unspecified: Secondary | ICD-10-CM | POA: Insufficient documentation

## 2020-03-20 DIAGNOSIS — Z888 Allergy status to other drugs, medicaments and biological substances status: Secondary | ICD-10-CM | POA: Diagnosis not present

## 2020-03-20 DIAGNOSIS — Z79899 Other long term (current) drug therapy: Secondary | ICD-10-CM | POA: Insufficient documentation

## 2020-03-20 DIAGNOSIS — H2511 Age-related nuclear cataract, right eye: Secondary | ICD-10-CM | POA: Diagnosis not present

## 2020-03-20 DIAGNOSIS — Z87891 Personal history of nicotine dependence: Secondary | ICD-10-CM | POA: Insufficient documentation

## 2020-03-20 DIAGNOSIS — I252 Old myocardial infarction: Secondary | ICD-10-CM | POA: Diagnosis not present

## 2020-03-20 DIAGNOSIS — Z7982 Long term (current) use of aspirin: Secondary | ICD-10-CM | POA: Diagnosis not present

## 2020-03-20 DIAGNOSIS — Z6831 Body mass index (BMI) 31.0-31.9, adult: Secondary | ICD-10-CM | POA: Diagnosis not present

## 2020-03-20 DIAGNOSIS — Z880 Allergy status to penicillin: Secondary | ICD-10-CM | POA: Diagnosis not present

## 2020-03-20 HISTORY — DX: Chronic obstructive pulmonary disease, unspecified: J44.9

## 2020-03-20 HISTORY — DX: Other complications of anesthesia, initial encounter: T88.59XA

## 2020-03-20 HISTORY — DX: Presence of dental prosthetic device (complete) (partial): Z97.2

## 2020-03-20 HISTORY — DX: Acute myocardial infarction, unspecified: I21.9

## 2020-03-20 HISTORY — DX: Dizziness and giddiness: R42

## 2020-03-20 HISTORY — PX: CATARACT EXTRACTION W/PHACO: SHX586

## 2020-03-20 SURGERY — PHACOEMULSIFICATION, CATARACT, WITH IOL INSERTION
Anesthesia: Monitor Anesthesia Care | Site: Eye | Laterality: Right

## 2020-03-20 MED ORDER — MIDAZOLAM HCL 2 MG/2ML IJ SOLN
INTRAMUSCULAR | Status: DC | PRN
  Administered 2020-03-20: 1 mg via INTRAVENOUS

## 2020-03-20 MED ORDER — LACTATED RINGERS IV SOLN: INTRAVENOUS | Status: DC

## 2020-03-20 MED ORDER — MOXIFLOXACIN HCL 0.5 % OP SOLN
OPHTHALMIC | Status: DC | PRN
  Administered 2020-03-20: 0.2 mL via OPHTHALMIC

## 2020-03-20 MED ORDER — BRIMONIDINE TARTRATE-TIMOLOL 0.2-0.5 % OP SOLN
OPHTHALMIC | Status: DC | PRN
  Administered 2020-03-20: 1 [drp] via OPHTHALMIC

## 2020-03-20 MED ORDER — TETRACAINE HCL 0.5 % OP SOLN
1.0000 [drp] | OPHTHALMIC | Status: DC | PRN
  Administered 2020-03-20 (×3): 1 [drp] via OPHTHALMIC

## 2020-03-20 MED ORDER — ARMC OPHTHALMIC DILATING DROPS
1.0000 "application " | OPHTHALMIC | Status: DC | PRN
  Administered 2020-03-20 (×3): 1 via OPHTHALMIC

## 2020-03-20 MED ORDER — FENTANYL CITRATE (PF) 100 MCG/2ML IJ SOLN
INTRAMUSCULAR | Status: DC | PRN
  Administered 2020-03-20: 50 ug via INTRAVENOUS

## 2020-03-20 MED ORDER — EPINEPHRINE PF 1 MG/ML IJ SOLN
INTRAOCULAR | Status: DC | PRN
  Administered 2020-03-20: 61 mL via OPHTHALMIC

## 2020-03-20 MED ORDER — NA CHONDROIT SULF-NA HYALURON 40-17 MG/ML IO SOLN
INTRAOCULAR | Status: DC | PRN
  Administered 2020-03-20: 1 mL via INTRAOCULAR

## 2020-03-20 MED ORDER — LIDOCAINE HCL (PF) 2 % IJ SOLN
INTRAOCULAR | Status: DC | PRN
  Administered 2020-03-20: 1 mL

## 2020-03-20 SURGICAL SUPPLY — 22 items
CANNULA ANT/CHMB 27G (MISCELLANEOUS) ×2 IMPLANT
CANNULA ANT/CHMB 27GA (MISCELLANEOUS) ×4 IMPLANT
GLOVE SURG LX 8.0 MICRO (GLOVE) ×2
GLOVE SURG LX STRL 8.0 MICRO (GLOVE) ×1 IMPLANT
GLOVE SURG TRIUMPH 8.0 PF LTX (GLOVE) ×2 IMPLANT
GOWN STRL REUS W/ TWL LRG LVL3 (GOWN DISPOSABLE) ×2 IMPLANT
GOWN STRL REUS W/TWL LRG LVL3 (GOWN DISPOSABLE) ×4
LENS IOL DIOP 23.0 (Intraocular Lens) ×2 IMPLANT
LENS IOL TECNIS MONO 23.0 (Intraocular Lens) IMPLANT
MARKER SKIN DUAL TIP RULER LAB (MISCELLANEOUS) ×2 IMPLANT
NDL FILTER BLUNT 18X1 1/2 (NEEDLE) ×1 IMPLANT
NEEDLE FILTER BLUNT 18X 1/2SAF (NEEDLE) ×1
NEEDLE FILTER BLUNT 18X1 1/2 (NEEDLE) ×1 IMPLANT
PACK EYE AFTER SURG (MISCELLANEOUS) ×2 IMPLANT
PACK OPTHALMIC (MISCELLANEOUS) ×2 IMPLANT
PACK PORFILIO (MISCELLANEOUS) ×2 IMPLANT
SUT ETHILON 10-0 CS-B-6CS-B-6 (SUTURE)
SUTURE EHLN 10-0 CS-B-6CS-B-6 (SUTURE) IMPLANT
SYR 3ML LL SCALE MARK (SYRINGE) ×2 IMPLANT
SYR TB 1ML LUER SLIP (SYRINGE) ×2 IMPLANT
WATER STERILE IRR 250ML POUR (IV SOLUTION) ×2 IMPLANT
WIPE NON LINTING 3.25X3.25 (MISCELLANEOUS) ×2 IMPLANT

## 2020-03-20 NOTE — Anesthesia Procedure Notes (Signed)
Procedure Name: MAC Date/Time: 03/20/2020 11:39 AM Performed by: Cameron Ali, CRNA Pre-anesthesia Checklist: Patient identified, Emergency Drugs available, Suction available, Timeout performed and Patient being monitored Patient Re-evaluated:Patient Re-evaluated prior to induction Oxygen Delivery Method: Nasal cannula Placement Confirmation: positive ETCO2

## 2020-03-20 NOTE — H&P (Signed)
All labs reviewed. Abnormal studies sent to patients PCP when indicated.  Previous H&P reviewed, patient examined, there are NO CHANGES.  [name] Porfilio7/27/202111:32 AM

## 2020-03-20 NOTE — Anesthesia Preprocedure Evaluation (Signed)
Anesthesia Evaluation  Patient identified by MRN, date of birth, ID band Patient awake    Reviewed: NPO status   History of Anesthesia Complications (+) PROLONGED EMERGENCE and history of anesthetic complications  Airway Mallampati: II  TM Distance: >3 FB Neck ROM: full    Dental  (+) Upper Dentures   Pulmonary COPD (mild),  COPD inhaler, former smoker,    Pulmonary exam normal        Cardiovascular Exercise Tolerance: Good hypertension, + CAD, + Past MI (x2, 2003) and + CABG (2003)  Normal cardiovascular exam  echo: 2017: Ejection fraction mildly depressed, 45-50%;  ekg: 2/21: normal sinus rhythm with rate 66 bpm; old anterior MI, PVCs noted, old inferior MI;     Neuro/Psych negative neurological ROS  negative psych ROS   GI/Hepatic negative GI ROS, Neg liver ROS,   Endo/Other  Morbid obesity (bmi 31)  Renal/GU negative Renal ROS  negative genitourinary   Musculoskeletal   Abdominal   Peds  Hematology Chronic leukocytosis  followed by oncology     Anesthesia Other Findings cards cleared: 09/2019: [place];   Vaccine: 10/2019;  Reproductive/Obstetrics                             Anesthesia Physical Anesthesia Plan  ASA: III  Anesthesia Plan: MAC   Post-op Pain Management:    Induction:   PONV Risk Score and Plan: 1 and TIVA and Midazolam  Airway Management Planned:   Additional Equipment:   Intra-op Plan:   Post-operative Plan:   Informed Consent: I have reviewed the patients History and Physical, chart, labs and discussed the procedure including the risks, benefits and alternatives for the proposed anesthesia with the patient or authorized representative who has indicated his/her understanding and acceptance.       Plan Discussed with: CRNA  Anesthesia Plan Comments:         Anesthesia Quick Evaluation

## 2020-03-20 NOTE — Transfer of Care (Signed)
Immediate Anesthesia Transfer of Care Note  Patient: [name]  Procedure(s) Performed: CATARACT EXTRACTION PHACO AND INTRAOCULAR LENS PLACEMENT (IOC) RIGHT (Right Eye)  Patient Location: PACU  Anesthesia Type: MAC  Level of Consciousness: awake, alert  and patient cooperative  Airway and Oxygen Therapy: Patient Spontanous Breathing and Patient connected to supplemental oxygen  Post-op Assessment: Post-op Vital signs reviewed, Patient's Cardiovascular Status Stable, Respiratory Function Stable, Patent Airway and No signs of Nausea or vomiting  Post-op Vital Signs: Reviewed and stable  Complications: No complications documented.

## 2020-03-20 NOTE — Op Note (Signed)
PREOPERATIVE DIAGNOSIS:  Nuclear sclerotic cataract of the right eye.   POSTOPERATIVE DIAGNOSIS:  Cataract   OPERATIVE PROCEDURE:@   SURGEON:  Birder Robson, MD.   ANESTHESIA:  Anesthesiologist: Fidel Levy, MD CRNA: Cameron Ali, CRNA  1.      Managed anesthesia care. 2.      0.29ml of Shugarcaine was instilled in the eye following the paracentesis.   COMPLICATIONS:  None.   TECHNIQUE:   Stop and chop   DESCRIPTION OF PROCEDURE:  The patient was examined and consented in the preoperative holding area where the aforementioned topical anesthesia was applied to the right eye and then brought back to the Operating Room where the right eye was prepped and draped in the usual sterile ophthalmic fashion and a lid speculum was placed. A paracentesis was created with the side port blade and the anterior chamber was filled with viscoelastic. A near clear corneal incision was performed with the steel keratome. A continuous curvilinear capsulorrhexis was performed with a cystotome followed by the capsulorrhexis forceps. Hydrodissection and hydrodelineation were carried out with BSS on a blunt cannula. The lens was removed in a stop and chop  technique and the remaining cortical material was removed with the irrigation-aspiration handpiece. The capsular bag was inflated with viscoelastic and the Technis [id number]  lens was placed in the capsular bag without complication. The remaining viscoelastic was removed from the eye with the irrigation-aspiration handpiece. The wounds were hydrated. The anterior chamber was flushed with BSS and the eye was inflated to physiologic pressure. 0.42ml of Vigamox was placed in the anterior chamber. The wounds were found to be water tight. The eye was dressed with Combigan. The patient was given protective glasses to wear throughout the day and a shield with which to sleep tonight. The patient was also given drops with which to begin a drop regimen today and will follow-up  with me in one day. Implant Name Type Inv. Item Serial No. Manufacturer Lot No. LRB No. Used Action  LENS IOL DIOP 23.0 - [id number] Intraocular Lens LENS IOL DIOP 23.0 [id number] [employer]  Right 1 Implanted   Procedure(s) with comments: CATARACT EXTRACTION PHACO AND INTRAOCULAR LENS PLACEMENT (IOC) RIGHT (Right) - 4.86 0:30.2  Electronically signed: Birder Robson 03/20/2020 11:59 AM

## 2020-03-20 NOTE — Anesthesia Postprocedure Evaluation (Signed)
Anesthesia Post Note  Patient: [name]  Procedure(s) Performed: CATARACT EXTRACTION PHACO AND INTRAOCULAR LENS PLACEMENT (IOC) RIGHT (Right Eye)     Patient location during evaluation: PACU Anesthesia Type: MAC Level of consciousness: awake and alert Pain management: pain level controlled Vital Signs Assessment: post-procedure vital signs reviewed and stable Respiratory status: spontaneous breathing, nonlabored ventilation, respiratory function stable and patient connected to nasal cannula oxygen Cardiovascular status: stable and blood pressure returned to baseline Postop Assessment: no apparent nausea or vomiting Anesthetic complications: no   No complications documented.  Fidel Levy

## 2020-03-21 ENCOUNTER — Encounter: Payer: Self-pay | Admitting: Ophthalmology

## 2020-04-05 ENCOUNTER — Other Ambulatory Visit: Payer: Self-pay

## 2020-04-05 ENCOUNTER — Encounter: Payer: Self-pay | Admitting: Ophthalmology

## 2020-04-06 ENCOUNTER — Other Ambulatory Visit: Payer: Self-pay

## 2020-04-06 ENCOUNTER — Other Ambulatory Visit
Admission: RE | Admit: 2020-04-06 | Discharge: 2020-04-06 | Disposition: A | Discharge status: Home / Self Care | Payer: 59 | Source: Ambulatory Visit | Attending: Pediatric Dentistry | Admitting: Pediatric Dentistry

## 2020-04-06 DIAGNOSIS — Z20822 Contact with and (suspected) exposure to covid-19: Secondary | ICD-10-CM | POA: Insufficient documentation

## 2020-04-06 DIAGNOSIS — Z01812 Encounter for preprocedural laboratory examination: Secondary | ICD-10-CM | POA: Diagnosis not present

## 2020-04-06 LAB — SARS CORONAVIRUS 2 (TAT 6-24 HRS): SARS Coronavirus 2: NEGATIVE

## 2020-04-06 NOTE — Discharge Instructions (Signed)

## 2020-04-10 ENCOUNTER — Other Ambulatory Visit: Payer: Self-pay

## 2020-04-10 ENCOUNTER — Encounter
Admission: RE | Disposition: A | Discharge status: Home / Self Care | Payer: Self-pay | Source: Home / Self Care | Attending: Ophthalmology

## 2020-04-10 ENCOUNTER — Ambulatory Visit
Admission: RE | Admit: 2020-04-10 | Discharge: 2020-04-10 | Disposition: A | Discharge status: Home / Self Care | Payer: 59 | Attending: Ophthalmology | Admitting: Ophthalmology

## 2020-04-10 ENCOUNTER — Encounter: Payer: Self-pay | Admitting: Ophthalmology

## 2020-04-10 ENCOUNTER — Ambulatory Visit: Payer: 59 | Admitting: Anesthesiology

## 2020-04-10 DIAGNOSIS — H2512 Age-related nuclear cataract, left eye: Secondary | ICD-10-CM | POA: Diagnosis not present

## 2020-04-10 DIAGNOSIS — Z951 Presence of aortocoronary bypass graft: Secondary | ICD-10-CM | POA: Diagnosis not present

## 2020-04-10 DIAGNOSIS — E7800 Pure hypercholesterolemia, unspecified: Secondary | ICD-10-CM | POA: Diagnosis not present

## 2020-04-10 DIAGNOSIS — I110 Hypertensive heart disease with heart failure: Secondary | ICD-10-CM | POA: Diagnosis not present

## 2020-04-10 DIAGNOSIS — Z87891 Personal history of nicotine dependence: Secondary | ICD-10-CM | POA: Insufficient documentation

## 2020-04-10 DIAGNOSIS — I509 Heart failure, unspecified: Secondary | ICD-10-CM | POA: Diagnosis not present

## 2020-04-10 DIAGNOSIS — I2510 Atherosclerotic heart disease of native coronary artery without angina pectoris: Secondary | ICD-10-CM | POA: Insufficient documentation

## 2020-04-10 DIAGNOSIS — Z7982 Long term (current) use of aspirin: Secondary | ICD-10-CM | POA: Insufficient documentation

## 2020-04-10 DIAGNOSIS — J449 Chronic obstructive pulmonary disease, unspecified: Secondary | ICD-10-CM | POA: Insufficient documentation

## 2020-04-10 DIAGNOSIS — Z79899 Other long term (current) drug therapy: Secondary | ICD-10-CM | POA: Diagnosis not present

## 2020-04-10 HISTORY — DX: Heart failure, unspecified: I50.9

## 2020-04-10 HISTORY — DX: Pneumonia, unspecified organism: J18.9

## 2020-04-10 HISTORY — DX: Dyspnea, unspecified: R06.00

## 2020-04-10 HISTORY — PX: CATARACT EXTRACTION W/PHACO: SHX586

## 2020-04-10 SURGERY — PHACOEMULSIFICATION, CATARACT, WITH IOL INSERTION
Anesthesia: Monitor Anesthesia Care | Site: Eye | Laterality: Left

## 2020-04-10 MED ORDER — ACETAMINOPHEN 325 MG PO TABS: 325.0000 mg | ORAL_TABLET | ORAL | Status: DC | PRN

## 2020-04-10 MED ORDER — ARMC OPHTHALMIC DILATING DROPS
1.0000 "application " | OPHTHALMIC | Status: DC | PRN
  Administered 2020-04-10 (×3): 1 via OPHTHALMIC

## 2020-04-10 MED ORDER — TETRACAINE HCL 0.5 % OP SOLN
1.0000 [drp] | OPHTHALMIC | Status: DC | PRN
  Administered 2020-04-10 (×3): 1 [drp] via OPHTHALMIC

## 2020-04-10 MED ORDER — LIDOCAINE HCL (PF) 2 % IJ SOLN
INTRAOCULAR | Status: DC | PRN
  Administered 2020-04-10: 2 mL

## 2020-04-10 MED ORDER — NA CHONDROIT SULF-NA HYALURON 40-17 MG/ML IO SOLN
INTRAOCULAR | Status: DC | PRN
  Administered 2020-04-10: 1 mL via INTRAOCULAR

## 2020-04-10 MED ORDER — MOXIFLOXACIN HCL 0.5 % OP SOLN
OPHTHALMIC | Status: DC | PRN
  Administered 2020-04-10: 0.2 mL via OPHTHALMIC

## 2020-04-10 MED ORDER — ACETAMINOPHEN 160 MG/5ML PO SOLN: 325.0000 mg | ORAL | Status: DC | PRN

## 2020-04-10 MED ORDER — EPINEPHRINE PF 1 MG/ML IJ SOLN
INTRAOCULAR | Status: DC | PRN
  Administered 2020-04-10: 48 mL via OPHTHALMIC

## 2020-04-10 MED ORDER — ONDANSETRON HCL 4 MG/2ML IJ SOLN: 4.0000 mg | Freq: Once | INTRAMUSCULAR | Status: DC | PRN

## 2020-04-10 MED ORDER — LACTATED RINGERS IV SOLN: INTRAVENOUS | Status: DC

## 2020-04-10 MED ORDER — FENTANYL CITRATE (PF) 100 MCG/2ML IJ SOLN
INTRAMUSCULAR | Status: DC | PRN
  Administered 2020-04-10: 100 ug via INTRAVENOUS

## 2020-04-10 MED ORDER — MIDAZOLAM HCL 2 MG/2ML IJ SOLN
INTRAMUSCULAR | Status: DC | PRN
  Administered 2020-04-10: 2 mg via INTRAVENOUS

## 2020-04-10 MED ORDER — BRIMONIDINE TARTRATE-TIMOLOL 0.2-0.5 % OP SOLN
OPHTHALMIC | Status: DC | PRN
  Administered 2020-04-10: 1 [drp] via OPHTHALMIC

## 2020-04-10 SURGICAL SUPPLY — 20 items
CANNULA ANT/CHMB 27G (MISCELLANEOUS) ×2 IMPLANT
CANNULA ANT/CHMB 27GA (MISCELLANEOUS) ×4 IMPLANT
GLOVE SURG LX 8.0 MICRO (GLOVE) ×1
GLOVE SURG LX STRL 8.0 MICRO (GLOVE) ×1 IMPLANT
GLOVE SURG TRIUMPH 8.0 PF LTX (GLOVE) ×2 IMPLANT
GOWN STRL REUS W/ TWL LRG LVL3 (GOWN DISPOSABLE) ×2 IMPLANT
GOWN STRL REUS W/TWL LRG LVL3 (GOWN DISPOSABLE) ×4
LENS IOL DIOP 23.5 (Intraocular Lens) ×2 IMPLANT
LENS IOL TECNIS MONO 23.5 (Intraocular Lens) IMPLANT
MARKER SKIN DUAL TIP RULER LAB (MISCELLANEOUS) ×2 IMPLANT
NDL FILTER BLUNT 18X1 1/2 (NEEDLE) ×1 IMPLANT
NEEDLE FILTER BLUNT 18X 1/2SAF (NEEDLE) ×1
NEEDLE FILTER BLUNT 18X1 1/2 (NEEDLE) ×1 IMPLANT
PACK EYE AFTER SURG (MISCELLANEOUS) ×2 IMPLANT
PACK OPTHALMIC (MISCELLANEOUS) ×2 IMPLANT
PACK PORFILIO (MISCELLANEOUS) ×2 IMPLANT
SYR 3ML LL SCALE MARK (SYRINGE) ×2 IMPLANT
SYR TB 1ML LUER SLIP (SYRINGE) ×2 IMPLANT
WATER STERILE IRR 250ML POUR (IV SOLUTION) ×2 IMPLANT
WIPE NON LINTING 3.25X3.25 (MISCELLANEOUS) ×2 IMPLANT

## 2020-04-10 NOTE — Anesthesia Postprocedure Evaluation (Signed)
Anesthesia Post Note  Patient: Eldar R Moe  Procedure(s) Performed: CATARACT EXTRACTION PHACO AND INTRAOCULAR LENS PLACEMENT (IOC) LEFT 4.99 00:27.6 (Left Eye)     Patient location during evaluation: PACU Anesthesia Type: MAC Level of consciousness: awake and alert Pain management: pain level controlled Vital Signs Assessment: post-procedure vital signs reviewed and stable Respiratory status: spontaneous breathing, nonlabored ventilation, respiratory function stable and patient connected to nasal cannula oxygen Cardiovascular status: stable and blood pressure returned to baseline Postop Assessment: no apparent nausea or vomiting Anesthetic complications: no   No complications documented.  Sinda Du

## 2020-04-10 NOTE — Anesthesia Preprocedure Evaluation (Signed)
Anesthesia Evaluation  Patient identified by MRN, date of birth, ID band Patient awake    Reviewed: NPO status   History of Anesthesia Complications (+) PROLONGED EMERGENCE and history of anesthetic complications  Airway Mallampati: II  TM Distance: >3 FB Neck ROM: full    Dental  (+) Upper Dentures   Pulmonary shortness of breath, pneumonia, COPD (mild),  COPD inhaler, former smoker,    Pulmonary exam normal breath sounds clear to auscultation       Cardiovascular Exercise Tolerance: Good hypertension, + CAD, + Past MI (x2, 2003), + CABG (2003) and +CHF  Normal cardiovascular exam Rhythm:Regular Rate:Normal  echo: 2017: Ejection fraction mildly depressed, 45-50%;  ekg: 2/21: normal sinus rhythm with rate 66 bpm; old anterior MI, PVCs noted, old inferior MI;     Neuro/Psych negative neurological ROS  negative psych ROS   GI/Hepatic negative GI ROS, Neg liver ROS,   Endo/Other  Morbid obesity (bmi 31)  Renal/GU negative Renal ROS  negative genitourinary   Musculoskeletal   Abdominal (+) + obese,   Peds  Hematology Chronic leukocytosis  followed by oncology     Anesthesia Other Findings cards cleared: 09/2019: golan;   Vaccine: 10/2019;  Reproductive/Obstetrics                             Anesthesia Physical  Anesthesia Plan  ASA: III  Anesthesia Plan: MAC   Post-op Pain Management:    Induction:   PONV Risk Score and Plan: 1 and TIVA, Midazolam and Treatment may vary due to age or medical condition  Airway Management Planned: Nasal Cannula  Additional Equipment:   Intra-op Plan:   Post-operative Plan:   Informed Consent: I have reviewed the patients History and Physical, chart, labs and discussed the procedure including the risks, benefits and alternatives for the proposed anesthesia with the patient or authorized representative who has indicated his/her understanding and  acceptance.       Plan Discussed with: CRNA  Anesthesia Plan Comments:         Anesthesia Quick Evaluation   Patient Active Problem List   Diagnosis Date Noted  . Diaphoresis 08/03/2018  . COPD exacerbation ([place]) 12/31/2015  . SOB (shortness of breath) 12/31/2015  . Hypokalemia 12/31/2015  . Left foot pain 03/28/2013  . Erectile dysfunction 01/28/2013  . DIZZINESS 05/09/2010  . DIAPHORESIS 05/09/2010  . CHEST PAIN 05/09/2010  . Hyperlipidemia 01/04/2010  . HYPERTENSION, BENIGN 01/04/2010  . CAD, ARTERY BYPASS GRAFT 01/04/2010    CBC Latest Ref Rng & Units 12/27/2015 03/28/2013 03/19/2012  WBC 4.0 - 10.5 K/uL 14.3(H) 12.1(H) 11.7(H)  Hemoglobin 13.0 - 17.0 g/dL 14.1 16.1 15.1  Hematocrit 39 - 52 % 41.0 44.9 43.4  Platelets 150 - 400 K/uL 245 - 237   BMP Latest Ref Rng & Units 03/25/2018 12/27/2015 03/28/2013  Glucose 65 - 99 mg/dL - 104(H) 92  BUN 6 - 20 mg/dL - 7 9  Creatinine 0.61 - 1.24 mg/dL 0.70 0.88 0.85  BUN/Creat Ratio 9 - 20 - - 11  Sodium 135 - 145 mmol/L - 132(L) 142  Potassium 3.5 - 5.1 mmol/L - 3.0(L) 5.0  Chloride 101 - 111 mmol/L - 95(L) 101  CO2 22 - 32 mmol/L - 24 23  Calcium 8.9 - 10.3 mg/dL - 8.9 9.9    Risks and benefits of anesthesia discussed at length, patient or surrogate demonstrates understanding. Appropriately NPO. Plan to proceed with anesthesia.  Champ Mungo,  MD 04/10/20

## 2020-04-10 NOTE — Transfer of Care (Signed)
Immediate Anesthesia Transfer of Care Note  Patient: [name]  Procedure(s) Performed: CATARACT EXTRACTION PHACO AND INTRAOCULAR LENS PLACEMENT (IOC) LEFT 4.99 00:27.6 (Left Eye)  Patient Location: PACU  Anesthesia Type: MAC  Level of Consciousness: awake, alert  and patient cooperative  Airway and Oxygen Therapy: Patient Spontanous Breathing and Patient connected to supplemental oxygen  Post-op Assessment: Post-op Vital signs reviewed, Patient's Cardiovascular Status Stable, Respiratory Function Stable, Patent Airway and No signs of Nausea or vomiting  Post-op Vital Signs: Reviewed and stable  Complications: No complications documented.

## 2020-04-10 NOTE — H&P (Signed)
All labs reviewed. Abnormal studies sent to patients PCP when indicated.  Previous H&P reviewed, patient examined, there are NO CHANGES.  Robert Rigdon Porfilio8/17/20218:35 AM

## 2020-04-10 NOTE — Anesthesia Procedure Notes (Signed)
Procedure Name: MAC Date/Time: 04/10/2020 8:44 AM Performed by: Silvana Newness, CRNA Pre-anesthesia Checklist: Patient identified, Emergency Drugs available, Suction available, Patient being monitored and Timeout performed Patient Re-evaluated:Patient Re-evaluated prior to induction Oxygen Delivery Method: Nasal cannula Placement Confirmation: positive ETCO2

## 2020-04-10 NOTE — Op Note (Signed)
PREOPERATIVE DIAGNOSIS:  Nuclear sclerotic cataract of the left eye.   POSTOPERATIVE DIAGNOSIS:  Nuclear sclerotic cataract of the left eye.   OPERATIVE PROCEDURE:@   SURGEON:  Birder Robson, MD.   ANESTHESIA:  Anesthesiologist: Sinda Du, MD CRNA: Silvana Newness, CRNA  1.      Managed anesthesia care. 2.     0.66ml of Shugarcaine was instilled following the paracentesis   COMPLICATIONS:  None.   TECHNIQUE:   Stop and chop   DESCRIPTION OF PROCEDURE:  The patient was examined and consented in the preoperative holding area where the aforementioned topical anesthesia was applied to the left eye and then brought back to the Operating Room where the left eye was prepped and draped in the usual sterile ophthalmic fashion and a lid speculum was placed. A paracentesis was created with the side port blade and the anterior chamber was filled with viscoelastic. A near clear corneal incision was performed with the steel keratome. A continuous curvilinear capsulorrhexis was performed with a cystotome followed by the capsulorrhexis forceps. Hydrodissection and hydrodelineation were carried out with BSS on a blunt cannula. The lens was removed in a stop and chop  technique and the remaining cortical material was removed with the irrigation-aspiration handpiece. The capsular bag was inflated with viscoelastic and the Technis ZCB00 lens was placed in the capsular bag without complication. The remaining viscoelastic was removed from the eye with the irrigation-aspiration handpiece. The wounds were hydrated. The anterior chamber was flushed with BSS and the eye was inflated to physiologic pressure. 0.45ml Vigamox was placed in the anterior chamber. The wounds were found to be water tight. The eye was dressed with Combigan. The patient was given protective glasses to wear throughout the day and a shield with which to sleep tonight. The patient was also given drops with which to begin a drop regimen today and  will follow-up with me in one day. Implant Name Type Inv. Item Serial No. Manufacturer Lot No. LRB No. Used Action  LENS IOL DIOP 23.5 - [id number] Intraocular Lens LENS IOL DIOP 23.5 [id number] [employer]  Left 1 Implanted    Procedure(s): CATARACT EXTRACTION PHACO AND INTRAOCULAR LENS PLACEMENT (IOC) LEFT 4.99 00:27.6 (Left)  Electronically signed: Birder Robson 04/10/2020 8:57 AM

## 2020-04-11 ENCOUNTER — Encounter: Payer: Self-pay | Admitting: Ophthalmology

## 2020-05-07 ENCOUNTER — Other Ambulatory Visit: Payer: Self-pay | Admitting: Cardiovascular Disease

## 2020-08-06 ENCOUNTER — Telehealth: Payer: Self-pay | Admitting: Cardiovascular Disease

## 2020-08-06 MED ORDER — NITROGLYCERIN 0.4 MG SL SUBL: 0.4000 mg | SUBLINGUAL_TABLET | SUBLINGUAL | 4 refills | Status: DC | PRN

## 2020-08-06 NOTE — Telephone Encounter (Signed)
*  STAT* If patient is at the pharmacy, call can be transferred to refill team.   1. Which medications need to be refilled? (please list name of each medication and dose if known)   Nitroglycerin 0.4 mg sL q 5 min prn up to 3 doses   2. Which pharmacy/location (including street and city if local pharmacy) is medication to be sent to?   [facility] [address] [facility] [employer]    3. Do they need a 30 day or 90 day supply? [place]

## 2020-08-06 NOTE — Telephone Encounter (Signed)
Rx request sent to pharmacy.  

## 2020-08-31 ENCOUNTER — Other Ambulatory Visit: Payer: Self-pay

## 2020-08-31 ENCOUNTER — Encounter (HOSPITAL_COMMUNITY): Payer: Self-pay

## 2020-08-31 DIAGNOSIS — Z87891 Personal history of nicotine dependence: Secondary | ICD-10-CM | POA: Insufficient documentation

## 2020-08-31 DIAGNOSIS — I2510 Atherosclerotic heart disease of native coronary artery without angina pectoris: Secondary | ICD-10-CM | POA: Diagnosis not present

## 2020-08-31 DIAGNOSIS — I509 Heart failure, unspecified: Secondary | ICD-10-CM | POA: Diagnosis not present

## 2020-08-31 DIAGNOSIS — R059 Cough, unspecified: Secondary | ICD-10-CM | POA: Diagnosis present

## 2020-08-31 DIAGNOSIS — U071 COVID-19: Secondary | ICD-10-CM | POA: Insufficient documentation

## 2020-08-31 DIAGNOSIS — Z7951 Long term (current) use of inhaled steroids: Secondary | ICD-10-CM | POA: Diagnosis not present

## 2020-08-31 DIAGNOSIS — Z951 Presence of aortocoronary bypass graft: Secondary | ICD-10-CM | POA: Insufficient documentation

## 2020-08-31 DIAGNOSIS — Z79899 Other long term (current) drug therapy: Secondary | ICD-10-CM | POA: Diagnosis not present

## 2020-08-31 DIAGNOSIS — J441 Chronic obstructive pulmonary disease with (acute) exacerbation: Secondary | ICD-10-CM | POA: Diagnosis not present

## 2020-08-31 DIAGNOSIS — I110 Hypertensive heart disease with heart failure: Secondary | ICD-10-CM | POA: Insufficient documentation

## 2020-08-31 DIAGNOSIS — Z7982 Long term (current) use of aspirin: Secondary | ICD-10-CM | POA: Insufficient documentation

## 2020-08-31 NOTE — ED Triage Notes (Signed)
Pt reports shob, cough, body aches and exposure to wife who tested positive for covid last Monday. Pt sts sx began today at exactly 1330.

## 2020-09-01 ENCOUNTER — Emergency Department (HOSPITAL_COMMUNITY): Payer: 59

## 2020-09-01 ENCOUNTER — Emergency Department (HOSPITAL_COMMUNITY)
Admission: EM | Admit: 2020-09-01 | Discharge: 2020-09-01 | Disposition: A | Discharge status: Home / Self Care | Payer: 59 | Attending: Emergency Medicine | Admitting: Emergency Medicine

## 2020-09-01 DIAGNOSIS — U071 COVID-19: Secondary | ICD-10-CM

## 2020-09-01 LAB — BASIC METABOLIC PANEL
Anion gap: 11 (ref 5–15)
BUN: 14 mg/dL (ref 8–23)
CO2: 24 mmol/L (ref 22–32)
Calcium: 8.7 mg/dL — ABNORMAL LOW (ref 8.9–10.3)
Chloride: 97 mmol/L — ABNORMAL LOW (ref 98–111)
Creatinine, Ser: 1.03 mg/dL (ref 0.61–1.24)
GFR, Estimated: >60 mL/min (ref >60)
Glucose, Bld: 105 mg/dL — ABNORMAL HIGH (ref 70–99)
Potassium: 3.2 mmol/L — ABNORMAL LOW (ref 3.5–5.1)
Sodium: 132 mmol/L — ABNORMAL LOW (ref 135–145)

## 2020-09-01 LAB — CBC
HCT: 40.3 % (ref 39.0–52.0)
Hemoglobin: 13.9 g/dL (ref 13.0–17.0)
MCH: 31.2 pg (ref 26.0–34.0)
MCHC: 34.5 g/dL (ref 30.0–36.0)
MCV: 90.4 fL (ref 80.0–100.0)
Platelets: 192 10*3/uL (ref 150–400)
RBC: 4.46 MIL/uL (ref 4.22–5.81)
RDW: 12.2 % (ref 11.5–15.5)
WBC: 8.4 10*3/uL (ref 4.0–10.5)
nRBC: 0 % (ref 0.0–0.2)

## 2020-09-01 LAB — POC SARS CORONAVIRUS 2 AG -  ED: SARS Coronavirus 2 Ag: POSITIVE — AB

## 2020-09-01 LAB — SARS CORONAVIRUS 2 (TAT 6-24 HRS): SARS Coronavirus 2: POSITIVE — AB

## 2020-09-01 MED ORDER — SODIUM CHLORIDE 0.9 % IV SOLN
100.0000 mg | Freq: Every day | INTRAVENOUS | Status: DC
  Administered 2020-09-01: 100 mg via INTRAVENOUS
  Filled 2020-09-01: qty 20

## 2020-09-01 MED ORDER — POTASSIUM CHLORIDE CRYS ER 20 MEQ PO TBCR
40.0000 meq | EXTENDED_RELEASE_TABLET | Freq: Once | ORAL | Status: AC
  Administered 2020-09-01: 40 meq via ORAL
  Filled 2020-09-01: qty 2

## 2020-09-01 MED ORDER — SODIUM CHLORIDE 0.9 % IV SOLN
200.0000 mg | Freq: Once | INTRAVENOUS | Status: AC
  Administered 2020-09-01: 200 mg via INTRAVENOUS
  Filled 2020-09-01: qty 200

## 2020-09-01 MED ORDER — IBUPROFEN 200 MG PO TABS
600.0000 mg | ORAL_TABLET | Freq: Once | ORAL | Status: AC
  Administered 2020-09-01: 600 mg via ORAL
  Filled 2020-09-01: qty 3

## 2020-09-01 NOTE — ED Notes (Signed)
ED Provider at bedside. PA Nyoka Cowden

## 2020-09-01 NOTE — Discharge Instructions (Addendum)
You are COVID-19 positive.   Please maintain isolation precautions.  Check your temperature regularly and take Tylenol as needed for fever control.  Increase your oral hydration and continue to eat regular meals.  I recommend over-the-counter medications as needed for symptom relief.    Follow-up with your primary care provider regarding today's encounter and for ongoing management.    You have been treated with first dose of remdesivir here in the ED.  I have also reached out to the [facility] infusion center and notified them that you have received your first treatment.  They should give you a phone call about scheduling a follow-up treatment.  If not, please call: [phone] and go through the steps to get connected.  Return to the ED or seek immediate medical attention should you experience any new or worsening symptoms.

## 2020-09-01 NOTE — ED Provider Notes (Signed)
[facility] DEPT Provider Note   CSN: [id number] Arrival date & time: 08/31/20  2058     History Chief Complaint  Patient presents with   Shortness of Breath   Covid Exposure    [name] is a 65 y.o. 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  On my examination, patient reports that yesterday afternoon he began feeling short of breath with generalized body aches, fevers, headache, and mild nonproductive cough.  He reports that he takes care of his grandchild who had been recently ill.  He also states that his wife had been COVID-19 positive couple weeks ago, however is now doing better.  He is feeling short of breath with minimal exertion.  He reports that his O2 saturation dropped to 90% on RA last evening and he called his primary care provider who advised him to come to the ED for infusion.  Patient is denying any chest pain, inability eat or drink, abdominal pain, nausea vomiting, hemoptysis, unilateral extremity swelling or edema, history of clots or clotting disorder, or other symptoms.  Patient was immunized 11/2019, no booster.  No usual home oxygen requirements.  HPI     Past Medical History:  Diagnosis Date   CAD (coronary artery disease)    CHF (congestive heart failure) (HCC)    Chronic fatigue    Complication of anesthesia    BP drops.  Slow to wake.   COPD (chronic obstructive pulmonary disease) (HCC)    Dyspnea    with exertion   HLD (hyperlipidemia)    HTN (hypertension)    Leukocytosis    Myocardial infarction ([place])    (x2)  last time 02/2002   Pneumonia    Hx of   Vertigo    1 episode, 5-6 yrs ago   Wears dentures    full upper    Patient Active Problem List   Diagnosis Date Noted   Diaphoresis 08/03/2018   COPD exacerbation ([place]) 12/31/2015   SOB (shortness of breath) 12/31/2015    Hypokalemia 12/31/2015   Left foot pain 03/28/2013   Erectile dysfunction 01/28/2013   DIZZINESS 05/09/2010   DIAPHORESIS 05/09/2010   CHEST PAIN 05/09/2010   Hyperlipidemia 01/04/2010   HYPERTENSION, BENIGN 01/04/2010   CAD, ARTERY BYPASS GRAFT 01/04/2010    Past Surgical History:  Procedure Laterality Date   APPENDECTOMY     APPENDECTOMY     CATARACT EXTRACTION W/PHACO Right 03/20/2020   Procedure: CATARACT EXTRACTION PHACO AND INTRAOCULAR LENS PLACEMENT ([place]) RIGHT;  Surgeon: Birder Robson, MD;  Location: [place];  Service: Ophthalmology;  Laterality: Right;  4.86 0:30.2   CATARACT EXTRACTION W/PHACO Left 04/10/2020   Procedure: CATARACT EXTRACTION PHACO AND INTRAOCULAR LENS PLACEMENT (IOC) LEFT 4.99 00:27.6;  Surgeon: Birder Robson, MD;  Location: [place];  Service: Ophthalmology;  Laterality: Left;   COLONOSCOPY     CORONARY ARTERY BYPASS GRAFT  02/2002   4 vessel   HERNIA REPAIR     x 2   NECK SURGERY     cervical fusion       Family History  Problem Relation Age of Onset   Heart attack Mother 62   Heart attack Father 6    Social History   Tobacco Use   Smoking status: Former Smoker    Packs/day: 1.00    Years: 21.00  Pack years: 21.00    Types: Cigarettes    Quit date: 03/20/2002    Years since quitting: 18.4   Smokeless tobacco: Never Used   Tobacco comment: Tobacco use - no  Vaping Use   Vaping Use: Never used  Substance Use Topics   Alcohol use: Yes    Alcohol/week: 2.0 standard drinks    Types: 2 Cans of beer per week    Comment: daily   Drug use: No    Home Medications Prior to Admission medications   Medication Sig Start Date End Date Taking? Authorizing Provider  ALBUTEROL IN Inhale into the lungs.    [provider]  amLODipine (NORVASC) 10 MG tablet TAKE 1/2 TO 1 TABLET BY MOUTH EVERY DAY 01/16/20   Minna Merritts, MD  aspirin 81 MG EC tablet Take 81 mg by mouth every  morning.  03/21/11   Minna Merritts, MD  hydrochlorothiazide (HYDRODIURIL) 25 MG tablet TAKE 1 TABLET BY MOUTH EVERY DAY 11/28/19   Minna Merritts, MD  irbesartan (AVAPRO) 150 MG tablet TAKE 1 TABLET BY MOUTH EVERY DAY 05/08/20   Minna Merritts, MD  nitroGLYCERIN (NITROSTAT) 0.4 MG SL tablet Place 1 tablet (0.4 mg total) under the tongue every 5 (five) minutes as needed for chest pain. Maximum of three doses. 08/06/20   Minna Merritts, MD  pravastatin (PRAVACHOL) 40 MG tablet TAKE 1 TABLET BY MOUTH EVERY DAY AT BEDTIME. 01/16/20   Minna Merritts, MD  SPIRIVA HANDIHALER 18 MCG inhalation capsule 1 capsule daily. 08/11/19   [provider]    Allergies    Codeine, Heparin, Heparin (bovine), Penicillins, and Tape  Review of Systems   Review of Systems  All other systems reviewed and are negative.   Physical Exam Updated Vital Signs BP 126/70 (BP Location: Left Arm)    Pulse 65    Temp 99.3 F (37.4 C) (Oral)    Resp 20    SpO2 95%   Physical Exam Vitals and nursing note reviewed. Exam conducted with a chaperone present.  Constitutional:      General: He is not in acute distress.    Appearance: He is ill-appearing.  HENT:     Head: Normocephalic and atraumatic.  Eyes:     General: No scleral icterus.    Conjunctiva/sclera: Conjunctivae normal.  Cardiovascular:     Rate and Rhythm: Normal rate and regular rhythm.     Pulses: Normal pulses.     Heart sounds: Normal heart sounds.  Pulmonary:     Effort: Pulmonary effort is normal. No respiratory distress.     Comments: CTA bilaterally.  No significant increased work of breathing.  Borderline tachypnea. Musculoskeletal:     Cervical back: Normal range of motion. No rigidity.     Right lower leg: No edema.     Left lower leg: No edema.  Skin:    General: Skin is dry.  Neurological:     Mental Status: He is alert and oriented to person, place, and time.     GCS: GCS eye subscore is 4. GCS verbal subscore is 5.  GCS motor subscore is 6.  Psychiatric:        Mood and Affect: Mood normal.        Behavior: Behavior normal.        Thought Content: Thought content normal.     ED Results / Procedures / Treatments   Labs (all labs ordered are listed, but only abnormal results are  displayed) Labs Reviewed  BASIC METABOLIC PANEL - Abnormal; Notable for the following components:      Result Value   Sodium 132 (*)    Potassium 3.2 (*)    Chloride 97 (*)    Glucose, Bld 105 (*)    Calcium 8.7 (*)    All other components within normal limits  POC SARS CORONAVIRUS 2 AG -  ED - Abnormal; Notable for the following components:   SARS Coronavirus 2 Ag POSITIVE (*)    All other components within normal limits  SARS CORONAVIRUS 2 (TAT 6-24 HRS)  CBC    EKG None  Radiology DG Chest Portable 1 View  Result Date: 09/01/2020 CLINICAL DATA:  Shortness of breath. Patient tested positive for COVID-19 five days ago EXAM: PORTABLE CHEST 1 VIEW COMPARISON:  Dec 27, 2015 FINDINGS: The heart size and mediastinal contours are within normal limits. Both lungs are clear. The visualized skeletal structures are unremarkable. IMPRESSION: No active disease. Electronically Signed   By: Dorise Bullion III M.D   On: 09/01/2020 10:50    Procedures Procedures (including critical care time)  Medications Ordered in ED Medications  remdesivir 200 mg in sodium chloride 0.9% 250 mL IVPB (0 mg Intravenous Stopped 09/01/20 1158)    Followed by  remdesivir 100 mg in sodium chloride 0.9 % 100 mL IVPB (0 mg Intravenous Stopped 09/01/20 1108)  ibuprofen (ADVIL) tablet 600 mg (600 mg Oral Given 09/01/20 1025)  potassium chloride SA (KLOR-CON) CR tablet 40 mEq (40 mEq Oral Given 09/01/20 1050)    ED Course  I have reviewed the triage vital signs and the nursing notes.  Pertinent labs & imaging results that were available during my care of the patient were reviewed by me and considered in my medical decision making (see chart for  details).    MDM Rules/Calculators/A&P                          Patient's history and physical exam is concerning for COVID-19.    Given patient's significant risk factors, spoke with pharmacy and they advised remdesivir infusion here in the ED followed by 2-day continued outpatient infusion with remdesivir at [facility].   Basic laboratory work-up was obtained and unremarkable.  6-24-hour COVID-19 screening was obtained in triage.  However, positive COVID testing is required before infusions can be administered.  Will proceed with POC antigen test.  If positive, will proceed with infusion.  If negative, referral to [facility] while PCR testing is pending.    Patient positive on rapid antigen test.  Will order remdesivir per pharm.  Will also replenish with 40 mEq K-Dur here in the ED and advised laboratory recheck in 5 to 6 days outpatient with primary care provider.  Encouraging foods rich in potassium in addition to daily multivitamins.  Seems to be largely consistent with his baseline BMP.    Discussed that antibiotics are not indicated for viral infections.  Patient will be discharged with symptomatic treatment.  Patient is tolerating food and liquid without difficulty.  I emphasized the importance of rest, continued oral hydration, and antipyretics as needed for fever control.    They were provided opportunity to ask any additional questions and have none at this time.  Prior to discharge patient is feeling well, agreeable with plan for discharge home.  They have expressed understanding of verbal discharge instructions as well as return precautions and are agreeable to the plan.  [name] was evaluated in Emergency Department on 09/01/2020 for the symptoms described in the history of present illness. He was evaluated in the context of the global COVID-19 pandemic, which necessitated consideration that the patient might be at risk for infection with the SARS-CoV-2 virus  that causes COVID-19. Institutional protocols and algorithms that pertain to the evaluation of patients at risk for COVID-19 are in a state of rapid change based on information released by regulatory bodies including the CDC and federal and state organizations. These policies and algorithms were followed during the patient's care in the ED.  Final Clinical Impression(s) / ED Diagnoses Final diagnoses:  [id number]    Rx / DC Orders ED Discharge Orders    None       Reita Chard 09/01/20 1209    Lacretia Leigh, MD 09/02/20 [phone]

## 2020-09-01 NOTE — Progress Notes (Signed)
3300  Patient scheduled for outpatient Remdesivir infusions at 10:30am on Monday 1/10 and Tuesday 1/11 at [facility]. Please inform the patient to park at [place], as staff will be escorting the patient through the [place] entrance of the hospital. Appointments take approximately 45 minutes.    There is a wave flag banner located near the entrance on [address]. Turn into this entrance and immediately turn left or right and park in 1 of the 10 designated Covid Infusion Parking spots. There is a phone number on the sign, please call and let the staff know what spot you are in and we will come out and get you. For questions call [phone].  Thanks.

## 2020-09-03 ENCOUNTER — Ambulatory Visit (HOSPITAL_COMMUNITY)
Admission: RE | Admit: 2020-09-03 | Discharge: 2020-09-03 | Disposition: A | Discharge status: Home / Self Care | Payer: 59 | Source: Ambulatory Visit | Attending: Pulmonary Disease | Admitting: Pulmonary Disease

## 2020-09-03 DIAGNOSIS — U071 COVID-19: Secondary | ICD-10-CM | POA: Diagnosis not present

## 2020-09-03 MED ORDER — FAMOTIDINE IN NACL 20-0.9 MG/50ML-% IV SOLN: 20.0000 mg | Freq: Once | INTRAVENOUS | Status: DC | PRN

## 2020-09-03 MED ORDER — METHYLPREDNISOLONE SODIUM SUCC 125 MG IJ SOLR: 125.0000 mg | Freq: Once | INTRAMUSCULAR | Status: DC | PRN

## 2020-09-03 MED ORDER — SODIUM CHLORIDE 0.9 % IV SOLN
100.0000 mg | Freq: Once | INTRAVENOUS | Status: AC
  Administered 2020-09-03: 100 mg via INTRAVENOUS

## 2020-09-03 MED ORDER — EPINEPHRINE 0.3 MG/0.3ML IJ SOAJ: 0.3000 mg | Freq: Once | INTRAMUSCULAR | Status: DC | PRN

## 2020-09-03 MED ORDER — SODIUM CHLORIDE 0.9 % IV SOLN: INTRAVENOUS | Status: DC | PRN

## 2020-09-03 MED ORDER — ALBUTEROL SULFATE HFA 108 (90 BASE) MCG/ACT IN AERS: 2.0000 | INHALATION_SPRAY | Freq: Once | RESPIRATORY_TRACT | Status: DC | PRN

## 2020-09-03 MED ORDER — DIPHENHYDRAMINE HCL 50 MG/ML IJ SOLN: 50.0000 mg | Freq: Once | INTRAMUSCULAR | Status: DC | PRN

## 2020-09-03 NOTE — Discharge Instructions (Signed)
If you have any questions or concerns after the infusion please call the Advanced Practice Provider on call at [phone]. This number is ONLY intended for your use regarding questions or concerns about the infusion post-treatment side-effects.  Please do not provide this number to others for use. For return to work notes please contact your primary care provider.  ° °If someone you know is interested in receiving treatment please have them call the COVID hotline at [phone]. ° ° ° °10 Things You Can Do to Manage Your COVID-19 Symptoms at Home °If you have possible or confirmed COVID-19: °1. Stay home except to get medical care. °2. Monitor your symptoms carefully. If your symptoms get worse, call your healthcare provider immediately. °3. Get rest and stay hydrated. °4. If you have a medical appointment, call the healthcare provider ahead of time and tell them that you have or may have COVID-19. °5. For medical emergencies, call 911 and notify the dispatch personnel that you have or may have COVID-19. °6. Cover your cough and sneezes with a tissue or use the inside of your elbow. °7. Wash your hands often with soap and water for at least 20 seconds or clean your hands with an alcohol-based hand sanitizer that contains at least 60% alcohol. °8. As much as possible, stay in a specific room and away from other people in your home. Also, you should use a separate bathroom, if available. If you need to be around other people in or outside of the home, wear a mask. °9. Avoid sharing personal items with other people in your household, like dishes, towels, and bedding. °10. Clean all surfaces that are touched often, like counters, tabletops, and doorknobs. Use household cleaning sprays or wipes according to the label instructions. °cdc.gov/coronavirus °03/09/2020 °This information is not intended to replace advice given to you by your health care provider. Make sure you discuss any questions you have with your health  care provider. °Document Revised: 06/25/2020 Document Reviewed: 06/25/2020 °Elsevier Patient Education © 2021 Elsevier Inc. ° °

## 2020-09-03 NOTE — Progress Notes (Signed)
  Diagnosis: COVID-19  Physician: Dr. Joya Gaskins  Procedure: [facility] Med: remdesivir infusion - Provided patient with remdesivir fact sheet for patients, parents and caregivers prior to infusion.  Complications: No immediate complications noted.  Discharge: Discharged home   [name] 09/03/2020

## 2020-09-04 ENCOUNTER — Ambulatory Visit (HOSPITAL_COMMUNITY)
Admission: RE | Admit: 2020-09-04 | Discharge: 2020-09-04 | Disposition: A | Discharge status: Home / Self Care | Payer: 59 | Source: Ambulatory Visit | Attending: Pulmonary Disease | Admitting: Pulmonary Disease

## 2020-09-04 DIAGNOSIS — U071 COVID-19: Secondary | ICD-10-CM | POA: Diagnosis not present

## 2020-09-04 MED ORDER — SODIUM CHLORIDE 0.9 % IV SOLN
100.0000 mg | Freq: Once | INTRAVENOUS | Status: AC
  Administered 2020-09-04: 100 mg via INTRAVENOUS

## 2020-09-04 MED ORDER — DIPHENHYDRAMINE HCL 50 MG/ML IJ SOLN: 50.0000 mg | Freq: Once | INTRAMUSCULAR | Status: DC | PRN

## 2020-09-04 MED ORDER — FAMOTIDINE IN NACL 20-0.9 MG/50ML-% IV SOLN: 20.0000 mg | Freq: Once | INTRAVENOUS | Status: DC | PRN

## 2020-09-04 MED ORDER — SODIUM CHLORIDE 0.9 % IV SOLN: INTRAVENOUS | Status: DC | PRN

## 2020-09-04 MED ORDER — ALBUTEROL SULFATE HFA 108 (90 BASE) MCG/ACT IN AERS: 2.0000 | INHALATION_SPRAY | Freq: Once | RESPIRATORY_TRACT | Status: DC | PRN

## 2020-09-04 MED ORDER — METHYLPREDNISOLONE SODIUM SUCC 125 MG IJ SOLR: 125.0000 mg | Freq: Once | INTRAMUSCULAR | Status: DC | PRN

## 2020-09-04 MED ORDER — EPINEPHRINE 0.3 MG/0.3ML IJ SOAJ: 0.3000 mg | Freq: Once | INTRAMUSCULAR | Status: DC | PRN

## 2020-09-04 NOTE — Discharge Instructions (Signed)
.  What types of side effects do monoclonal antibody drugs cause?  Common side effects  In general, the more common side effects caused by monoclonal antibody drugs include: . Allergic reactions, such as hives or itching . Flu-like signs and symptoms, including chills, fatigue, fever, and muscle aches and pains . Nausea, vomiting . Diarrhea . Skin rashes . Low blood pressure   The CDC is recommending patients who receive monoclonal antibody treatments wait at least 90 days before being vaccinated.  Currently, there are no data on the safety and efficacy of mRNA COVID-19 vaccines in persons who received monoclonal antibodies or convalescent plasma as part of COVID-19 treatment. Based on the estimated half-life of such therapies as well as evidence suggesting that reinfection is uncommon in the 90 days after initial infection, vaccination should be deferred for at least 90 days, as a precautionary measure until additional information becomes available, to avoid interference of the antibody treatment with vaccine-induced immune responses.  If you have any questions or concerns after the infusion please call the Advanced Practice Provider on call at [phone]. This number is ONLY intended for your use regarding questions or concerns about the infusion post-treatment side-effects.  Please do not provide this number to others for use. For return to work notes please contact your primary care provider.   If someone you know is interested in receiving treatment please have them call the COVID hotline at [phone].   

## 2020-09-04 NOTE — Progress Notes (Signed)
  Diagnosis: COVID-19  Physician: Dr. Wright  Procedure: [facility] Infusion Clinic Med: remdesivir infusion - Provided patient with remdesivir fact sheet for patients, parents and caregivers prior to infusion.  Complications: No immediate complications noted.  Discharge: Discharged home   [name] 09/04/2020  

## 2020-09-04 NOTE — Progress Notes (Signed)
Patient reviewed Fact Sheet for Patients, Parents, and Caregivers for Emergency Use Authorization (EUA) of Remdesivir for the Treatment of Coronavirus. Patient also reviewed and is agreeable to the estimated cost of treatment. Patient is agreeable to proceed.   

## 2020-10-01 ENCOUNTER — Other Ambulatory Visit: Payer: Self-pay | Admitting: Cardiovascular Disease

## 2020-10-03 NOTE — Addendum Note (Signed)
Encounter addended by: Paul Dykes, RN on: 10/03/2020 5:57 PM  Actions taken: Charge Capture section accepted

## 2020-10-03 NOTE — Addendum Note (Signed)
Encounter addended by: Paul Dykes, RN on: 10/03/2020 6:03 PM  Actions taken: Charge Capture section accepted

## 2020-10-10 ENCOUNTER — Ambulatory Visit: Payer: 59 | Admitting: Cardiovascular Disease

## 2020-10-13 NOTE — Progress Notes (Signed)
Cardiology Office Note  Date:  10/15/2020   ID:  [name], [name] June 15, 1956, MRN [id number]  PCP:  Cyndi Bender, PA-C   Chief Complaint  Patient presents with  . Follow-up    12 month F/U-Patient reports having spasms in his chest and legs over the last several weeks.    HPI:  65 year old gentleman with a history of  three vessel CAD,  CABG 01/2002, LIMA to the LAD, vein graft to the diagonal, vein graft to the OM and vein graft to the distal RCA.  cath 2014 showing patent grafts severe three-vessel disease. hyperlipidemia  hypertension,  smoking history for 23 years who stopped in 2003,   Chronic leukocytosis  followed by oncology  At [facility] ([place] , Dr. Phill Myron who presents for routine follow up of his coronary artery disease.   Seen in the ER 09/01/2020: SOB,body aches, fevers, headache, and mild nonproductive cough Hypoxia,  covid +, given infusion Records reviewed  Reports having some muscle spasms in the chest, legs for 6 months Potassium running low  No regular exercise program Still tired from covid Chest pain or shortness of breath with exertion Has residual cough No PND orthopnea  Retired, worked 30 years, heavy machinery Has a grandchild, 78 yr old  Recent lab work reviewed  potassium 3.2 On HCTZ with no supplemental potassium Normal CBC No recent lipid panel available  EKG personally reviewed by myself on todays visit Shows normal sinus rhythm with rate 69 bpm unable to exclude old anterior MI, old inferior MI  Problems with erectile dysfunction On  Viagra   other past medical history works 10 hours per day, 4 days per week  In heavy machinery erectile dysfunction symptoms  He is not on testosterone supplement as he was previously.  Reports that oncology does not want his testosterone high   previously reported some cramping in his legs , unclear if this is from pravastatin In the past he had frequent episodes of sweating, malaise/weakness  initially felt to be secondary to bradycardia from metoprolol  previous 30 day monitor in October 2015 given his continued symptoms of sweating, malaise, weakness. Monitor did not show any significant arrhythmia. No significant bradycardia noted   episode 03/28/2014 where he was working with his tools, sitting, started sweating all over. This happened at 9 AM. His work colleagues sat him in a chair and gave him something to drink. He felt weak for 3 days  catheterization at the end of 2014 for anginal symptoms showing patent grafts severe three-vessel disease. Medical management was recommended.  In recovery following a cardiac catheterization, he had episodes of sweating/flushing, malaise associated with bradycardia. Initially it was felt that this was a vagal response to pressure being applied to his groin. Later in recovery, he continued to have bradycardia. He was told to hold his metoprolol at the time of discharge.  flushing, dizziness, malaise that he was having at home prior to the procedure  resolved.   PMH:   has a past medical history of CAD (coronary artery disease), CHF (congestive heart failure) ([place]), Chronic fatigue, Complication of anesthesia, COPD (chronic obstructive pulmonary disease) ([place]), Dyspnea, HLD (hyperlipidemia), HTN (hypertension), Leukocytosis, Myocardial infarction ([place]), Pneumonia, Vertigo, and Wears dentures.  PSH:    Past Surgical History:  Procedure Laterality Date  . APPENDECTOMY    . APPENDECTOMY    . CATARACT EXTRACTION W/PHACO Right 03/20/2020   Procedure: CATARACT EXTRACTION PHACO AND INTRAOCULAR LENS PLACEMENT ([place]) RIGHT;  Surgeon: Birder Robson, MD;  Location:  [facility];  Service: Ophthalmology;  Laterality: Right;  4.86 0:30.2  . CATARACT EXTRACTION W/PHACO Left 04/10/2020   Procedure: CATARACT EXTRACTION PHACO AND INTRAOCULAR LENS PLACEMENT ([facility]) LEFT 4.99 00:27.6;  Surgeon: Birder Robson, MD;  Location: [place];   Service: Ophthalmology;  Laterality: Left;  . COLONOSCOPY    . CORONARY ARTERY BYPASS GRAFT  02/2002   4 vessel  . HERNIA REPAIR     x 2  . NECK SURGERY     cervical fusion    Current Outpatient Medications  Medication Sig Dispense Refill  . ALBUTEROL IN Inhale into the lungs every 4 (four) hours as needed.    [name] amLODipine (NORVASC) 10 MG tablet TAKE 1/2 TO 1 TABLET BY MOUTH EVERY DAY 30 tablet 8  . aspirin 81 MG EC tablet Take 81 mg by mouth every morning.     . hydrochlorothiazide (HYDRODIURIL) 25 MG tablet TAKE 1 TABLET BY MOUTH EVERY DAY 30 tablet 5  . irbesartan (AVAPRO) 150 MG tablet TAKE 1 TABLET BY MOUTH EVERY DAY 30 tablet 5  . nitroGLYCERIN (NITROSTAT) 0.4 MG SL tablet Place 1 tablet (0.4 mg total) under the tongue every 5 (five) minutes as needed for chest pain. Maximum of three doses. 25 tablet 4  . pravastatin (PRAVACHOL) 40 MG tablet TAKE 1 TABLET BY MOUTH EVERY DAY AT BEDTIME. 30 tablet 8  . SPIRIVA HANDIHALER 18 MCG inhalation capsule 1 capsule daily.     No current facility-administered medications for this visit.    Allergies:   Codeine, Heparin, Heparin (bovine), Penicillins, and Tape   Social History:  The patient  reports that he quit smoking about 18 years ago. His smoking use included cigarettes. He has a 21.00 pack-year smoking history. He has never used smokeless tobacco. He reports current alcohol use of about 2.0 standard drinks of alcohol per week. He reports that he does not use drugs.   Family History:   family history includes Heart attack (age of onset: 48) in his father; Heart attack (age of onset: 60) in his mother.    Review of Systems: Review of Systems  Constitutional: Negative.   HENT: Negative.   Respiratory: Negative.   Cardiovascular: Negative.   Gastrointestinal: Negative.   Musculoskeletal: Positive for myalgias.  Neurological: Negative.   Psychiatric/Behavioral: Negative.   All other systems reviewed and are negative.   PHYSICAL  EXAM: VS:  There were no vitals taken for this visit. , BMI There is no height or weight on file to calculate BMI. Constitutional:  oriented to person, place, and time. No distress.  HENT:  Head: Grossly normal Eyes:  no discharge. No scleral icterus.  Neck: No JVD, no carotid bruits  Cardiovascular: Regular rate and rhythm, no murmurs appreciated Pulmonary/Chest: Clear to auscultation bilaterally, no wheezes or rails Abdominal: Soft.  no distension.  no tenderness.  Musculoskeletal: Normal range of motion Neurological:  normal muscle tone. Coordination normal. No atrophy Skin: Skin warm and dry Psychiatric: normal affect, pleasant   Recent Labs: 09/01/2020: BUN 14; Creatinine, Ser 1.03; Hemoglobin 13.9; Platelets 192; Potassium 3.2; Sodium 132    Lipid Panel Lab Results  Component Value Date   CHOL 134 07/30/2012   HDL 46 07/30/2012   LDLCALC 61 07/30/2012   TRIG 135 07/30/2012    Wt Readings from Last 3 Encounters:  04/10/20 197 lb (89.4 kg)  03/20/20 192 lb (87.1 kg)  09/27/19 199 lb 8 oz (90.5 kg)     ASSESSMENT AND PLAN:  HYPERTENSION, BENIGN -  Will make HCTZ as needed given cramping, low potassium Increase amlodipine up to 10, stay on the ARB We did discuss with him, We could increase irbesartan up to 300 daily if blood pressure continues to run high  Atherosclerosis of coronary artery bypass graft of native heart without angina pectoris Currently with no symptoms of angina. No further workup at this time. Continue current medication regimen.  Mixed hyperlipidemia goal LDL less than 70 His numbers 80 We have encouraged continued exercise, careful diet management in an effort to lose weight.  covid 19 pneumonia Recovering, still with cough Loose cough  Long discussion concerning stressors at home, wife with anxiety  Total encounter time more than 25 minutes  Greater than 50% was spent in counseling and coordination of care with the patient    No  orders of the defined types were placed in this encounter.    Signed, Esmond Plants, [name], Ph.D. 10/15/2020  [place], [place]

## 2020-10-15 ENCOUNTER — Ambulatory Visit: Payer: 59 | Admitting: Cardiovascular Disease

## 2020-10-15 ENCOUNTER — Other Ambulatory Visit: Payer: Self-pay

## 2020-10-15 ENCOUNTER — Encounter: Payer: Self-pay | Admitting: Cardiovascular Disease

## 2020-10-15 VITALS — BP 130/74 | HR 69 | Ht 66.0 in | Wt 198.0 lb

## 2020-10-15 DIAGNOSIS — J441 Chronic obstructive pulmonary disease with (acute) exacerbation: Secondary | ICD-10-CM

## 2020-10-15 DIAGNOSIS — I25708 Atherosclerosis of coronary artery bypass graft(s), unspecified, with other forms of angina pectoris: Secondary | ICD-10-CM

## 2020-10-15 DIAGNOSIS — I10 Essential (primary) hypertension: Secondary | ICD-10-CM

## 2020-10-15 DIAGNOSIS — E782 Mixed hyperlipidemia: Secondary | ICD-10-CM

## 2020-10-15 MED ORDER — HYDROCHLOROTHIAZIDE 25 MG PO TABS: 25.0000 mg | ORAL_TABLET | Freq: Every day | ORAL | 3 refills | Status: DC | PRN

## 2020-10-15 MED ORDER — PRAVASTATIN SODIUM 40 MG PO TABS: 40.0000 mg | ORAL_TABLET | Freq: Every day | ORAL | 3 refills | Status: DC

## 2020-10-15 MED ORDER — IRBESARTAN 150 MG PO TABS: 150.0000 mg | ORAL_TABLET | Freq: Every day | ORAL | 3 refills | Status: DC

## 2020-10-15 MED ORDER — NITROGLYCERIN 0.4 MG SL SUBL: 0.4000 mg | SUBLINGUAL_TABLET | SUBLINGUAL | 4 refills | Status: DC | PRN

## 2020-10-15 MED ORDER — AMLODIPINE BESYLATE 10 MG PO TABS: 10.0000 mg | ORAL_TABLET | Freq: Every day | ORAL | 3 refills | Status: DC

## 2020-10-15 NOTE — Patient Instructions (Addendum)
Medication Instructions:  HCTZ 25 mg daily  TAKE AS NEEDED  (for ankle swelling, high blood pressure, & when eating out)  Amlodipine 10 mg   Take once a day  Continue taking irbesartan  Lab work: No new labs needed  Testing/Procedures: No new testing needed   Follow-Up:  . You will need a follow up appointment in 12 months  . Providers on your designated Care Team:   . Murray Hodgkins, NP . Christell Faith, PA-C . Marrianne Mood, PA-C    COVID-19 Vaccine Information can be found at: [email or web address].uk For questions related to vaccine distribution or appointments, please email vaccine@[email or web address] .com or call [phone].

## 2021-03-22 ENCOUNTER — Encounter: Payer: Self-pay | Admitting: Cardiovascular Disease

## 2021-10-28 ENCOUNTER — Other Ambulatory Visit: Payer: Self-pay | Admitting: Cardiovascular Disease

## 2021-10-29 NOTE — Telephone Encounter (Signed)
Please schedule 12 month F/U appointment. Thank you! 

## 2021-10-29 NOTE — Telephone Encounter (Signed)
Patient will call back to schedule ?

## 2021-11-22 ENCOUNTER — Other Ambulatory Visit: Payer: Self-pay | Admitting: Cardiovascular Disease

## 2021-12-01 NOTE — Progress Notes (Signed)
Cardiology Office Note ? ?Date:  12/02/2021  ? ?ID:  [name], DOB Jan 13, 1956, MRN [id number] ? ?PCP:  Cyndi Bender, PA-C  ? ?Chief Complaint  ?Patient presents with  ? 12 month follow up   ?  Patient c/o shortness of breath at rest. Medications reviewed by the patient verbally.   ? ? ?HPI:  ?66 year old gentleman with a history of  ?three vessel CAD,  ?CABG 01/2002, LIMA to the LAD, vein graft to the diagonal, vein graft to the OM and vein graft to the distal RCA.  ?cath 2014 showing patent grafts severe three-vessel disease. ?hyperlipidemia  ?hypertension,  ?smoking history for 23 years who stopped in 2003,   ?Chronic leukocytosis  followed by oncology  At [facility] , Dr. Phill Myron ?who presents for routine follow up of his coronary artery disease.  ? ?LOV 2/22 ?In follow-up today several issues to discuss ?Having more SOB with exertion, such as stairs,  ?Occasional squeezing in chest at rest. ?Night sweats ? ?Total chol 157, LDL 93.  Labs done through primary care 2022 ?Only taking pravastatin QOD ? ?Retired, worked 30 years, heavy machinery ?Has a grandchild, 38 yr old ? ?Pain in the left foot radiating up his Achilles, posterior aspect of the thigh ? ?EKG personally reviewed by myself on todays visit ?Shows normal sinus rhythm with rate 62 bpm unable to exclude old anterior MI, old inferior MI ? ?Problems with erectile dysfunction ?On  Viagra ? ? other past medical history ?works 10 hours per day, 4 days per week  In heavy machinery ?erectile dysfunction symptoms ? He is not on testosterone supplement as he was previously.  Reports that oncology does not want his testosterone high ?  ? previously reported some cramping in his legs , unclear if this is from pravastatin ?In the past he had frequent episodes of sweating, malaise/weakness initially felt to be secondary to bradycardia from metoprolol ?  ?previous 30 day monitor in October 2015 given his continued symptoms of sweating, malaise, weakness. ?Monitor  did not show any significant arrhythmia. No significant bradycardia noted ?  ? episode 03/28/2014 where he was working with his tools, sitting, started sweating all over. This happened at 9 AM. His work colleagues sat him in a chair and gave him something to drink. He felt weak for 3 days ?  ?catheterization at the end of 2014 for anginal symptoms showing patent grafts severe three-vessel disease. Medical management was recommended. ?  ?In recovery following a cardiac catheterization, he had episodes of sweating/flushing, malaise associated with bradycardia. Initially it was felt that this was a vagal response to pressure being applied to his groin. Later in recovery, he continued to have bradycardia. He was told to hold his metoprolol at the time of discharge. ? flushing, dizziness, malaise that he was having at home prior to the procedure  resolved. ?  ? ?PMH:   has a past medical history of CAD (coronary artery disease), CHF (congestive heart failure) ([place]), Chronic fatigue, Complication of anesthesia, COPD (chronic obstructive pulmonary disease) ([place]), Dyspnea, HLD (hyperlipidemia), HTN (hypertension), Leukocytosis, Myocardial infarction ([place]), Pneumonia, Vertigo, and Wears dentures. ? ?PSH:    ?Past Surgical History:  ?Procedure Laterality Date  ? APPENDECTOMY    ? APPENDECTOMY    ? CATARACT EXTRACTION W/PHACO Right 03/20/2020  ? Procedure: CATARACT EXTRACTION PHACO AND INTRAOCULAR LENS PLACEMENT ([place]) RIGHT;  Surgeon: Birder Robson, MD;  Location: [facility];  Service: Ophthalmology;  Laterality: Right;  4.86 ?0:30.2  ? CATARACT  EXTRACTION W/PHACO Left 04/10/2020  ? Procedure: CATARACT EXTRACTION PHACO AND INTRAOCULAR LENS PLACEMENT ([facility]) LEFT 4.99 00:27.6;  Surgeon: Birder Robson, MD;  Location: [facility];  Service: Ophthalmology;  Laterality: Left;  ? COLONOSCOPY    ? CORONARY ARTERY BYPASS GRAFT  02/2002  ? 4 vessel  ? HERNIA REPAIR    ? x 2  ? NECK SURGERY    ? cervical fusion   ? ? ?Current Outpatient Medications  ?Medication Sig Dispense Refill  ? ALBUTEROL IN Inhale into the lungs every 4 (four) hours as needed.    ? amLODipine (NORVASC) 10 MG tablet TAKE 1 TABLET (10 MG TOTAL) BY MOUTH DAILY. 30 tablet 0  ? aspirin 81 MG EC tablet Take 81 mg by mouth every morning.     ? irbesartan (AVAPRO) 150 MG tablet Take 1 tablet (150 mg total) by mouth daily. PLEASE SCHEDULE OFFICE VISIT FOR FURTHER REFILLS. THANK YOU! 30 tablet 0  ? nitroGLYCERIN (NITROSTAT) 0.4 MG SL tablet Place 1 tablet (0.4 mg total) under the tongue every 5 (five) minutes as needed for chest pain. Maximum of three doses. 25 tablet 4  ? pravastatin (PRAVACHOL) 40 MG tablet Take 1 tablet (40 mg total) by mouth at bedtime. 90 tablet 3  ? tadalafil (CIALIS) 20 MG tablet Take 20 mg by mouth daily as needed.    ? SPIRIVA HANDIHALER 18 MCG inhalation capsule 1 capsule daily. (Patient not taking: Reported on 12/02/2021)    ? ?No current facility-administered medications for this visit.  ? ? ?Allergies:   Heparin (bovine), Codeine, Heparin, Penicillin g, Penicillins, and Tape  ? ?Social History:  The patient  reports that he quit smoking about 19 years ago. His smoking use included cigarettes. He has a 21.00 pack-year smoking history. He has never used smokeless tobacco. He reports current alcohol use of about 2.0 standard drinks per week. He reports that he does not use drugs.  ? ?Family History:   family history includes Heart attack (age of onset: 8) in his father; Heart attack (age of onset: 91) in his mother.  ? ? ?Review of Systems: ?Review of Systems  ?Constitutional: Negative.   ?HENT: Negative.    ?Respiratory: Negative.    ?Cardiovascular: Negative.   ?Gastrointestinal: Negative.   ?Musculoskeletal:  Positive for myalgias.  ?Neurological: Negative.   ?Psychiatric/Behavioral: Negative.    ?All other systems reviewed and are negative. ? ?PHYSICAL EXAM: ?VS:  BP (!) 150/70 (BP Location: Left Arm, Patient Position: Sitting,  Cuff Size: Normal)   Pulse 62   Ht '5\' 6"'$  (1.676 m)   Wt 89.1 kg   SpO2 98%   BMI 31.72 kg/m?  , BMI Body mass index is 31.72 kg/m?[name] ?Constitutional:  oriented to person, place, and time. No distress.  ?HENT:  ?Head: Grossly normal ?Eyes:  no discharge. No scleral icterus.  ?Neck: No JVD, no carotid bruits  ?Cardiovascular: Regular rate and rhythm, no murmurs appreciated ?Pulmonary/Chest: Clear to auscultation bilaterally, no wheezes or rails ?Abdominal: Soft.  no distension.  no tenderness.  ?Musculoskeletal: Normal range of motion ?Neurological:  normal muscle tone. Coordination normal. No atrophy ?Skin: Skin warm and dry ?Psychiatric: normal affect, pleasant ? ?Recent Labs: ?No results found for requested labs within last 8760 hours.  ? ? ?Lipid Panel ?Lab Results  ?Component Value Date  ? CHOL 134 07/30/2012  ? HDL 46 07/30/2012  ? [place] 61 07/30/2012  ? TRIG 135 07/30/2012  ? ? ?Wt Readings from Last 3 Encounters:  ?12/02/21  89.1 kg  ?10/15/20 89.8 kg  ?04/10/20 89.4 kg  ?  ? ?ASSESSMENT AND PLAN: ? ? ?HYPERTENSION, BENIGN -  ?BP well controlled at home 126-130 ?Did not take meds today ?Like to take medications with food ? ?Atherosclerosis of coronary artery bypass graft of native heart without angina pectoris ?Some squeezing chest concerning for angina, also some worsening shortness of breath ?Unable to exclude ischemia.  Had CABG 4 years ago ?EKG, recommended Lexiscan Myoview ? ?Mixed hyperlipidemia ?goal LDL less than 70 ?LDL was in the 90s, only taking pravastatin every other day recommend he take this daily ?Continue to monitor ?May need to add Zetia 10 mg daily with pravastatin ? ?covid 19 pneumonia ?full recovery ? ?Long discussion concerning stressors at home, wife with anxiety ? Total encounter time more than 30 minutes ? Greater than 50% was spent in counseling and coordination of care with the patient ? ? ? ?No orders of the defined types were placed in this encounter. ? ? ? ?Signed, ?Esmond Plants, [name], Ph.D. ?12/02/2021  ?[place] ?[phone] ? ? ?

## 2021-12-02 ENCOUNTER — Ambulatory Visit: Payer: 59 | Admitting: Cardiovascular Disease

## 2021-12-02 ENCOUNTER — Encounter: Payer: Self-pay | Admitting: Cardiovascular Disease

## 2021-12-02 VITALS — BP 150/70 | HR 62 | Ht 66.0 in | Wt 196.5 lb

## 2021-12-02 DIAGNOSIS — J441 Chronic obstructive pulmonary disease with (acute) exacerbation: Secondary | ICD-10-CM | POA: Diagnosis not present

## 2021-12-02 DIAGNOSIS — I10 Essential (primary) hypertension: Secondary | ICD-10-CM

## 2021-12-02 DIAGNOSIS — I25708 Atherosclerosis of coronary artery bypass graft(s), unspecified, with other forms of angina pectoris: Secondary | ICD-10-CM

## 2021-12-02 DIAGNOSIS — E782 Mixed hyperlipidemia: Secondary | ICD-10-CM

## 2021-12-02 DIAGNOSIS — Z951 Presence of aortocoronary bypass graft: Secondary | ICD-10-CM | POA: Diagnosis not present

## 2021-12-02 MED ORDER — IRBESARTAN 150 MG PO TABS
150.0000 mg | ORAL_TABLET | Freq: Every day | ORAL | 3 refills | Status: DC
Start: 2021-12-02 — End: 2022-12-22

## 2021-12-02 MED ORDER — ASPIRIN 81 MG PO TBEC
81.0000 mg | DELAYED_RELEASE_TABLET | Freq: Every morning | ORAL | 3 refills | Status: AC
Start: 2021-12-02 — End: ?

## 2021-12-02 MED ORDER — AMLODIPINE BESYLATE 10 MG PO TABS
10.0000 mg | ORAL_TABLET | Freq: Every day | ORAL | 3 refills | Status: DC
Start: 2021-12-02 — End: 2022-12-22

## 2021-12-02 MED ORDER — PRAVASTATIN SODIUM 40 MG PO TABS: 40.0000 mg | ORAL_TABLET | Freq: Every day | ORAL | 3 refills | Status: DC

## 2021-12-02 NOTE — Patient Instructions (Addendum)
Medication Instructions:  ?Pravastatin daily, not every other day ? ?90 day refills on cardiac meds ? ?If you need a refill on your cardiac medications before your next appointment, please call your pharmacy.  ? ?Lab work: ?No new labs needed ? ?Testing/Procedures: ? ?[facility] MYOVIEW ? ?Your caregiver has ordered a Stress Test with nuclear imaging. The purpose of this test is to evaluate the blood supply to your heart muscle. This procedure is referred to as a "Non-Invasive Stress Test." This is because other than having an IV started in your vein, nothing is inserted or "invades" your body. Cardiac stress tests are done to find areas of poor blood flow to the heart by determining the extent of coronary artery disease (CAD). Some patients exercise on a treadmill, which naturally increases the blood flow to your heart, while others who are  unable to walk on a treadmill due to physical limitations have a pharmacologic/chemical stress agent called Lexiscan . This medicine will mimic walking on a treadmill by temporarily increasing your coronary blood flow.  ? ?Please note: these test may take anywhere between 2-4 hours to complete ? ?PLEASE REPORT TO [facility]: [facility]  ?THE VOLUNTEERS AT THE FIRST DESK WILL DIRECT YOU WHERE TO GO ? ?Date of Procedure:_____________________________________ ? ?Arrival Time for Procedure:______________________________ ? ? ?PLEASE NOTIFY THE OFFICE AT LEAST 24 HOURS IN ADVANCE IF YOU ARE UNABLE TO KEEP YOUR APPOINTMENT.  [phone] ?AND  ?PLEASE NOTIFY NUCLEAR MEDICINE AT [facility] AT LEAST 10 HOURS IN ADVANCE IF YOU ARE UNABLE TO KEEP YOUR APPOINTMENT. [phone] ? ?How to prepare for your Myoview test: ? ?Do not eat or drink after midnight ?No caffeine for 24 hours prior to test ?No smoking 24 hours prior to test. ?Your medication may be taken with water.  If your doctor stopped a medication because of this test, do not take that medication. ?Ladies, please do not wear dresses.   Skirts or pants are appropriate. Please wear a short sleeve shirt. ?No perfume, cologne or lotion. ?Wear comfortable walking shoes. No heels! ? ? ?Follow-Up: ?At [facility], you and your health needs are our priority.  As part of our continuing mission to provide you with exceptional heart care, we have created designated Provider Care Teams.  These Care Teams include your primary Cardiologist (physician) and Advanced Practice Providers (APPs -  Physician Assistants and Nurse Practitioners) who all work together to provide you with the care you need, when you need it. ? ?You will need a follow up appointment in 6 months, APP ok ? ?Providers on your designated Care Team:   ?Murray Hodgkins, NP ?Christell Faith, PA-C ?Cadence Kathlen Mody, PA-C ? ?COVID-19 Vaccine Information can be found at: ShippingScam.co.uk For questions related to vaccine distribution or appointments, please email [email or web address] or call [phone].  ? ? ? ?

## 2021-12-02 NOTE — Addendum Note (Signed)
Addended by: Mila Merry on: 12/02/2021 09:27 AM ? ? Modules accepted: Orders ? ?

## 2021-12-05 ENCOUNTER — Encounter
Admission: RE | Admit: 2021-12-05 | Discharge: 2021-12-05 | Disposition: A | Discharge status: Home / Self Care | Payer: 59 | Source: Ambulatory Visit | Attending: Cardiovascular Disease | Admitting: Cardiovascular Disease

## 2021-12-05 DIAGNOSIS — I25708 Atherosclerosis of coronary artery bypass graft(s), unspecified, with other forms of angina pectoris: Secondary | ICD-10-CM | POA: Diagnosis present

## 2021-12-05 LAB — NM MYOCAR MULTI W/SPECT W/WALL MOTION / EF
Estimated workload: 1
Exercise duration (min): 0 min
Exercise duration (sec): 0 s
LV dias vol: 145 mL (ref 62–150)
LV sys vol: 61 mL
MPHR: 157 {beats}/min
Nuc Stress EF: 58 %
Peak HR: 87 {beats}/min
Percent HR: 55 %
Rest HR: 65 {beats}/min
Rest Nuclear Isotope Dose: 10.8 mCi
SDS: 2
SRS: 16
SSS: 16
ST Depression (mm): 0 mm
Stress Nuclear Isotope Dose: 32.5 mCi
TID: 1.05

## 2021-12-05 MED ORDER — TECHNETIUM TC 99M TETROFOSMIN IV KIT
10.0000 | PACK | Freq: Once | INTRAVENOUS | Status: AC | PRN
Start: 2021-12-05 — End: 2021-12-05
  Administered 2021-12-05: 10.81 via INTRAVENOUS

## 2021-12-05 MED ORDER — TECHNETIUM TC 99M TETROFOSMIN IV KIT
32.4900 | PACK | Freq: Once | INTRAVENOUS | Status: AC | PRN
Start: 2021-12-05 — End: 2021-12-05
  Administered 2021-12-05: 32.49 via INTRAVENOUS

## 2021-12-05 MED ORDER — REGADENOSON 0.4 MG/5ML IV SOLN
0.4000 mg | Freq: Once | INTRAVENOUS | Status: AC
  Administered 2021-12-05: 0.4 mg via INTRAVENOUS
  Filled 2021-12-05: qty 5

## 2021-12-06 ENCOUNTER — Telehealth: Payer: Self-pay | Admitting: Cardiovascular Disease

## 2021-12-06 NOTE — Telephone Encounter (Signed)
Wife to get results from test that was done. Please advise ?

## 2021-12-06 NOTE — Telephone Encounter (Signed)
Called patient's wife to let her know that results not yet sent to nursing and that she or patient would be called (likely Monday) once they were sent. Wife verbalized understanding and voiced appreciation for the call.  ?

## 2021-12-11 ENCOUNTER — Telehealth: Payer: Self-pay | Admitting: Emergency Medicine

## 2021-12-11 NOTE — Telephone Encounter (Signed)
-----   Message from Minna Merritts, MD sent at 12/06/2021  5:34 PM EDT ----- ?Stress test ?No significant ischemia noted or concern for new blockages ?Unable to exclude prior damage to inferior part of the heart/old MI ?Overall low risk study, no strong indication for cardiac catheterization ?

## 2021-12-11 NOTE — Telephone Encounter (Signed)
Called and spoke with patient. Results reviewed with patient, pt verbalized understanding,  questions (if any) answered.   ?

## 2021-12-11 NOTE — Telephone Encounter (Signed)
Patient's wife called again for results to his stress test.  ?

## 2021-12-11 NOTE — Telephone Encounter (Signed)
See results phone note from 4/19.  ? ?Closing this encounter.  ?

## 2022-01-03 ENCOUNTER — Ambulatory Visit: Payer: 59 | Admitting: Podiatry

## 2022-01-03 ENCOUNTER — Ambulatory Visit (INDEPENDENT_AMBULATORY_CARE_PROVIDER_SITE_OTHER): Payer: 59

## 2022-01-03 ENCOUNTER — Encounter: Payer: Self-pay | Admitting: Podiatry

## 2022-01-03 DIAGNOSIS — M722 Plantar fascial fibromatosis: Secondary | ICD-10-CM

## 2022-01-03 MED ORDER — METHYLPREDNISOLONE 4 MG PO TABS: 4.0000 mg | ORAL_TABLET | Freq: Every day | ORAL | 0 refills | Status: DC

## 2022-01-03 NOTE — Progress Notes (Signed)
This patient presents to the office stating his left foot is very painful.  He says his pain is 10 out of 10 and especially painful at night.  He has provided no self treatment.  He has history of plantar fasciitis left foot previously.  He presents to the office with an antalgic gait.  He has history of previous heart surgery but states he is not taking bllod thinners at present.   ? ?rightVascular  Dorsalis pedis and posterior tibial pulses are palpable  B/L.  Capillary return  WNL.  Temperature gradient is  WNL.  Skin turgor  WNL ? ?Sensorium  Senn Weinstein monofilament wire  WNL. Normal tactile sensation. ? ?Nail Exam  Patient has normal nails with no evidence of bacterial or fungal infection. ? ?Orthopedic  Exam  Muscle tone and muscle strength  WNL.  No limitations of motion feet  B/L.  No crepitus or joint effusion noted.  Foot type is unremarkable and digits show no abnormalities.  Mild  HAV/HL  B/L.  Palpable pain along the course of the plantar fascia left foot.  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 ? ?Skin  No open lesions.  Normal skin texture and turgor. ? ?Acute plantar fasciitis left foot   FHL ? ?IE.  Xray reveals no evience of calcification at insertion plantar fascia left foot.  No other pathology noted.  Discussed condition with patient and wife.  Dispense cam walker.  Prescribe medrol dose-pak .  RTC 10 days with one of the medical podiatrists.   ? ? ?Gardiner Barefoot DPM  ?

## 2022-01-16 ENCOUNTER — Ambulatory Visit: Payer: 59 | Admitting: Podiatry

## 2022-01-16 DIAGNOSIS — M722 Plantar fascial fibromatosis: Secondary | ICD-10-CM

## 2022-01-16 NOTE — Progress Notes (Signed)
  Subjective:  Patient ID: [name], male    DOB: May 19, 1956,  MRN: [id number]  Chief Complaint  Patient presents with   Plantar Fasciitis    66 y.o. male presents with the above complaint. History confirmed with patient. Has been wearing the boot, still very painful. Completed the medrol dose pack.  Made his breathing more difficult with his COPD  Objective:  Physical Exam: warm, good capillary refill, no trophic changes or ulcerative lesions, normal DP and PT pulses, and normal sensory exam. Left Foot: point tenderness over the heel pad  Assessment:  No diagnosis found.   Plan:  Patient was evaluated and treated and all questions answered.  Discussed the etiology and treatment options for plantar fasciitis including stretching, formal physical therapy, supportive shoegears such as a running shoe or sneaker, pre fabricated orthoses, injection therapy, and oral medications. We also discussed the role of surgical treatment of this for patients who do not improve after exhausting non-surgical treatment options.   -XR reviewed with patient -Educated patient on stretching and icing of the affected limb -Injection delivered to the plantar fascia of the left foot. -Unable to take NSAIDs due to CAD history -Methylprednisolone exacerbated his COPD -Home therapy plan given I reviewed this with him and the importance of this.  We discussed formal physical therapy but this is financially difficult for him currently.  After sterile prep with povidone-iodine solution and alcohol, the left heel was injected with 0.5cc 2% xylocaine plain, 0.5cc 0.5% marcaine plain, '5mg'$  triamcinolone acetonide, and '2mg'$  dexamethasone was injected along the medial plantar fascia at the insertion on the plantar calcaneus. The patient tolerated the procedure well without complication.  Return in about 1 month (around 02/16/2022) for recheck plantar fasciitis.

## 2022-01-16 NOTE — Patient Instructions (Signed)

## 2022-02-18 ENCOUNTER — Ambulatory Visit: Payer: 59 | Admitting: Podiatry

## 2022-03-31 ENCOUNTER — Other Ambulatory Visit: Payer: Self-pay | Admitting: Cardiovascular Disease

## 2022-06-03 ENCOUNTER — Other Ambulatory Visit: Payer: Self-pay | Admitting: Cardiovascular Disease

## 2022-06-03 ENCOUNTER — Ambulatory Visit: Payer: 59 | Attending: Cardiovascular Disease | Admitting: Cardiovascular Disease

## 2022-06-03 ENCOUNTER — Telehealth: Payer: Self-pay | Admitting: Cardiovascular Disease

## 2022-06-03 ENCOUNTER — Encounter: Payer: Self-pay | Admitting: Cardiovascular Disease

## 2022-06-03 VITALS — BP 142/80 | HR 64 | Ht 65.0 in | Wt 190.8 lb

## 2022-06-03 DIAGNOSIS — R55 Syncope and collapse: Secondary | ICD-10-CM

## 2022-06-03 DIAGNOSIS — I10 Essential (primary) hypertension: Secondary | ICD-10-CM | POA: Diagnosis not present

## 2022-06-03 DIAGNOSIS — I25708 Atherosclerosis of coronary artery bypass graft(s), unspecified, with other forms of angina pectoris: Secondary | ICD-10-CM

## 2022-06-03 NOTE — Telephone Encounter (Signed)
Patient's wife is calling to see if Dr. Rockey Situ has any recommendations due to the pt's recent hospitalization until he comes in to be seen next week.

## 2022-06-03 NOTE — Patient Instructions (Addendum)
Medication Instructions:   Please move amlodipine to the evening  If you need a refill on your cardiac medications before your next appointment, please call your pharmacy.   Lab work: No new labs needed  Testing/Procedures: Your physician has requested that you have an echocardiogram. Echocardiography is a painless test that uses sound waves to create images of your heart. It provides your doctor with information about the size and shape of your heart and how well your heart's chambers and valves are working. This procedure takes approximately one hour. There are no restrictions for this procedure.   Follow-Up: At [facility], you and your health needs are our priority.  As part of our continuing mission to provide you with exceptional heart care, we have created designated Provider Care Teams.  These Care Teams include your primary Cardiologist (physician) and Advanced Practice Providers (APPs -  Physician Assistants and Nurse Practitioners) who all work together to provide you with the care you need, when you need it.  You will need a follow up appointment in 1 month  Providers on your designated Care Team:   Murray Hodgkins, NP Christell Faith, PA-C Cadence Kathlen Mody, [place]  COVID-19 Vaccine Information can be found at: ShippingScam.co.uk For questions related to vaccine distribution or appointments, please email [email or web address] or call [phone].

## 2022-06-03 NOTE — Telephone Encounter (Signed)
Patients wife called in about patient having some episodes and was taken by ambulance to the ED at [facility] in [place]. They also placed a heart monitor. He was having a very severe episode.   Episodes were as follows:  No chest pain Feeling really weak Hot It has been going on for 3 1/2 weeks Heart rate has been up to 132  Episode where he felt like it stopped Big sweats Almost fainted BP 55/40 (she felt he vagaled) When EMT's got there BP was 150 He was ghost white  Not doing anything at the time of these episodes  When he arrived to hospital he was stable. EKG was normal, CT Head for stroke was negative, Elevated white count, and he is scheduled with PCP for Monday. He had a milder episode this morning and it lasted about 20 minutes. He felt like his heart stopped then went up high. DOD slot for today and patient scheduled to come in.

## 2022-06-03 NOTE — Progress Notes (Signed)
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  cath 2014 showing patent grafts severe three-vessel disease. hyperlipidemia  hypertension,  smoking history for 23 years who stopped in 2003,   Chronic leukocytosis  followed by oncology  At [facility] , Dr. Phill Myron who presents for routine follow up of his coronary artery disease.   Last seen by myself in clinic April 2023 Seen in the ER at [facility] June 01, 2022 for near syncope Was sitting in a chair at home, felt warm, diaphoretic, dizzy Per wife he looked pale Blood pressure was low at home, EMS called Vitals normal per EMS Symptoms lasted 1 hour, felt normal by the time he reached the emergency room Similar episode 2 weeks earlier, at the time heart rate was 130 bpm Work-up in the ER negative, was discharged with Zio patch Troponin negative, EKG with normal sinus rhythm CTA head with no acute findings  On further discussion events from recent ER visit discussed Has had several weaker spells not resolved without intervention Etiology still unclear Reports he is staying hydrated, rare episodes of chest discomfort Sleeping late in the afternoon Retired, worked 30 years, heavy machinery Has a grandchild, 50 yr old  EKG personally reviewed by myself on todays visit Normal sinus rhythm rate 64 bpm PVCs consider old anterior MI, consider old inferior MI.  No significant change from prior study  Problems with erectile dysfunction On  Viagra  other past medical history works 10 hours per day, 4 days per week  In heavy machinery erectile dysfunction symptoms He is not on  testosterone supplement as he was previously.  Reports that oncology does not want his testosterone high    previously reported some cramping in his legs , unclear if this is from pravastatin In the past he had frequent episodes of sweating, malaise/weakness initially felt to be secondary to bradycardia from metoprolol   previous 30 day monitor in October 2015 given his continued symptoms of sweating, malaise, weakness. Monitor did not show any significant arrhythmia. No significant bradycardia noted    episode 03/28/2014 where he was working with his tools, sitting, started sweating all over. This happened at 9 AM. His work colleagues sat him in a chair and gave him something to drink. He felt weak for 3 days   catheterization at the end of 2014 for anginal symptoms showing patent grafts severe three-vessel disease. Medical management was recommended.   In recovery following a cardiac catheterization, he had episodes of sweating/flushing, malaise associated with bradycardia. Initially it was felt that this was a vagal response to pressure being applied to his groin. Later in recovery, he continued to have bradycardia. He was told to hold his metoprolol at the time of discharge. lushing, dizziness, malaise that he was having at home prior to the procedure resolved.  PMH:   has a past medical history of CAD (coronary artery disease), CHF (congestive heart failure) ([place]), Chronic fatigue, Complication of anesthesia, COPD (chronic obstructive pulmonary disease) ([place]), Dyspnea, HLD (hyperlipidemia), HTN (hypertension), Leukocytosis, Myocardial infarction ([place]), Pneumonia, Vertigo, and Wears dentures.  PSH:    Past Surgical History:  Procedure Laterality Date   APPENDECTOMY     APPENDECTOMY     CATARACT EXTRACTION W/PHACO Right 03/20/2020   Procedure: CATARACT EXTRACTION PHACO AND INTRAOCULAR LENS PLACEMENT ([place]) RIGHT;  Surgeon: Birder Robson, MD;  Location: [place];  Service:  Ophthalmology;  Laterality: Right;  4.86 0:30.2   CATARACT EXTRACTION W/PHACO Left 04/10/2020   Procedure: CATARACT EXTRACTION PHACO AND INTRAOCULAR LENS PLACEMENT ([facility]) LEFT 4.99 00:27.6;  Surgeon: Birder Robson, MD;  Location: [place];  Service: Ophthalmology;  Laterality: Left;   COLONOSCOPY     CORONARY ARTERY BYPASS GRAFT  02/2002   4 vessel   HERNIA REPAIR     x 2   NECK SURGERY     cervical fusion    Current Outpatient Medications  Medication Sig Dispense Refill   ALBUTEROL IN Inhale into the lungs every 4 (four) hours as needed.     amLODipine (NORVASC) 10 MG tablet Take 1 tablet (10 mg total) by mouth daily. 90 tablet 3   aspirin 81 MG EC tablet Take 1 tablet (81 mg total) by mouth every morning. 90 tablet 3   irbesartan (AVAPRO) 150 MG tablet Take 1 tablet (150 mg total) by mouth daily. 90 tablet 3   methylPREDNISolone (MEDROL) 4 MG tablet Take 1 tablet (4 mg total) by mouth daily. 1 tablet 0   nitroGLYCERIN (NITROSTAT) 0.4 MG SL tablet DISSOLVE 1 TABLET UNDER THE TONGUE EVERY 5 MINUTES FOR UP TO 3 DOSES AS NEEDED FOR CHEST PAIN. IF NO RELIEF AFTER 3 DOSES, CALL 911 OR GO TO ER. 25 tablet 1   pravastatin (PRAVACHOL) 40 MG tablet Take 1 tablet (40 mg total) by mouth at bedtime. 90 tablet 3   SPIRIVA HANDIHALER 18 MCG inhalation capsule 1 capsule daily.     tadalafil (CIALIS) 20 MG tablet Take 20 mg by mouth daily as needed.     BREZTRI AEROSPHERE 160-9-4.8 MCG/ACT AERO Inhale 2 puffs into the lungs 2 (two) times daily.     No current facility-administered medications for this visit.    Allergies:   Heparin (bovine), Codeine, Heparin, Penicillin g, Penicillins, and Tape   Social History:  The patient  reports that he quit smoking about 20 years ago. His smoking use included cigarettes. He has a 21.00 pack-year smoking history. He has never used smokeless tobacco. He reports current alcohol use of about 2.0 standard drinks of alcohol per week. He reports that he  does not use drugs.   Family History:   family history includes Heart attack (age of onset: 48) in his father; Heart attack (age of onset: 15) in his mother.    Review of Systems: Review of Systems  Constitutional: Negative.   HENT: Negative.    Respiratory: Negative.    Cardiovascular: Negative.   Gastrointestinal: Negative.   Musculoskeletal: Negative.   Neurological: Negative.   Psychiatric/Behavioral: Negative.    All other systems reviewed and are negative.  PHYSICAL EXAM: VS:  BP (!) 142/80 (BP Location: Left Arm)   Pulse 64   Ht '5\' 5"'$  (1.651 m)   Wt 190 lb 12.8 oz (86.5 kg)   SpO2 95%   BMI 31.75 kg/m  , BMI Body mass index is 31.75 kg/m. Constitutional:  oriented to person, place, and time. No distress.  HENT:  Head: Grossly normal Eyes:  no discharge. No scleral icterus.  Neck: No JVD, no carotid bruits  Cardiovascular: Regular rate and rhythm, no murmurs appreciated Pulmonary/Chest:  Clear to auscultation bilaterally, no wheezes or rails Abdominal: Soft.  no distension.  no tenderness.  Musculoskeletal: Normal range of motion Neurological:  normal muscle tone. Coordination normal. No atrophy Skin: Skin warm and dry Psychiatric: normal affect, pleasant  Recent Labs: No results found for requested labs within last 365 days.    Lipid Panel Lab Results  Component Value Date   CHOL 134 07/30/2012   HDL 46 07/30/2012   LDLCALC 61 07/30/2012   TRIG 135 07/30/2012    Wt Readings from Last 3 Encounters:  06/03/22 190 lb 12.8 oz (86.5 kg)  12/02/21 196 lb 8 oz (89.1 kg)  10/15/20 198 lb (89.8 kg)     ASSESSMENT AND PLAN:  Near syncope Etiology unclear, vitals had improved by the time EMS arrived on recent episode Currently has Zio monitor in place, results will presumably be sent back to primary care or cardiology at [facility] No recent lab work abnormality We will order echocardiogram, last was 6 years ago  HYPERTENSION, BENIGN -  Blood pressure stable,  recommended he move amlodipine to the evening Irbesartan in the morning  Atherosclerosis of coronary artery bypass graft of native heart without angina pectoris Rare episodes of chest pain, recent near-syncope episodes  History of CABG  Prior Myoview April 2023, no high risk ischemia For recurrent episodes of near syncope, may require ischemic work-up, could consider cardiac catheterization  Mixed hyperlipidemia Lipids typically evaluated by primary care Goal LDL less than 70  covid 19 pneumonia full recovery   Total encounter time more than 40 minutes  Greater than 50% was spent in counseling and coordination of care with the patient   Orders Placed This Encounter  Procedures   EKG 12-Lead      Signed, Esmond Plants, [name], Ph.D. 06/03/2022  [place], [place]

## 2022-06-04 ENCOUNTER — Ambulatory Visit: Payer: 59 | Attending: Cardiovascular Disease

## 2022-06-04 DIAGNOSIS — R55 Syncope and collapse: Secondary | ICD-10-CM | POA: Diagnosis not present

## 2022-06-04 DIAGNOSIS — I25708 Atherosclerosis of coronary artery bypass graft(s), unspecified, with other forms of angina pectoris: Secondary | ICD-10-CM

## 2022-06-04 LAB — ECHOCARDIOGRAM COMPLETE
AR max vel: 2.65 cm2
AV Area VTI: 2.55 cm2
AV Area mean vel: 2.45 cm2
AV Mean grad: 3 mmHg
AV Peak grad: 4.8 mmHg
Ao pk vel: 1.1 m/s
Area-P 1/2: 3.97 cm2
Calc EF: 52.1 %
S' Lateral: 4 cm
Single Plane A2C EF: 50.4 %
Single Plane A4C EF: 52.6 %

## 2022-06-10 ENCOUNTER — Ambulatory Visit: Payer: 59 | Admitting: Cardiovascular Disease

## 2022-06-20 ENCOUNTER — Telehealth: Payer: Self-pay | Admitting: Cardiovascular Disease

## 2022-06-20 NOTE — Telephone Encounter (Signed)
Minna Merritts, MD  06/15/2022  1:53 PM EDT     Echocardiogram Low normal ejection fraction 50 to 55%, improved from prior study in 7741 Diastolic dysfunction noted Right ventricular function No significant valvular heart disease    Gerald Stabs, RN  06/16/2022 10:04 AM EDT     Results printed and mailed to patient

## 2022-06-20 NOTE — Telephone Encounter (Signed)
I spoke with the patient's wife (ok per DPR) and advised her of the patient's echo results. She voices understanding of these results.  She did want to know if Dr. Rockey Situ felt like the patient needed to keep his follow up with Ignacia Bayley, NP on 07/03/22, or could they see Dr. Rockey Situ in the new year/ his first available slot. She states the patient is having no new/ concerning symptoms.  She is aware we will review with Dr. Rockey Situ and call her back to confirm regarding the follow up appointment.

## 2022-06-20 NOTE — Telephone Encounter (Signed)
[place], [place] - 06/20/2022  9:33 AM Minna Merritts, MD  Sent: Fri June 20, 2022  4:48 PM  To: Emily Filbert, RN          Message  They can wait until the new year if they would like  Certain if they can call us back for new symptoms  Thx  TG

## 2022-06-20 NOTE — Telephone Encounter (Signed)
Pt would like a call to go over echo results since they haven't received the mailed results yet

## 2022-06-20 NOTE — Telephone Encounter (Signed)
I called and spoke with Mrs. Dorfman and advised her of Dr. Donivan Scull response as stated below. Mrs. Podgorski voices understanding and is agreeable.  I advised I will cancel the appointment on 07/03/22 with Ignacia Bayley, NP and ask our scheduling team to call next week to schedule a next available with Dr. Rockey Situ (ok if in the new year).  Mrs. Lieder is aware to call back sooner if needed.

## 2022-06-24 ENCOUNTER — Telehealth: Payer: Self-pay | Admitting: Cardiovascular Disease

## 2022-06-24 NOTE — Telephone Encounter (Signed)
Patients wife calling wanting to know if he should have a calcium scoring test done. Advised I would need to check with provider and will call her back.

## 2022-06-24 NOTE — Telephone Encounter (Signed)
Reviewed provider reply and answer to her request. Explained what the test looks for and given his history we know it will be high and not needed. She verbalized understanding with no further questions at this time.   Minna Merritts, MD  Sent: Tue June 24, 2022  1:56 PM    Message  Calcium scoring test will not be helpful in this situation  He has known coronary disease, lots of it, history of CABG  Score will be high which does not help [place]  Thx  Tg

## 2022-06-29 NOTE — Addendum Note (Signed)
Addended by: Minna Merritts on: 06/29/2022 01:06 PM   Modules accepted: Orders

## 2022-07-03 ENCOUNTER — Ambulatory Visit: Payer: 59 | Admitting: Nurse Practitioner

## 2022-08-21 NOTE — Progress Notes (Signed)
Cardiology Office Note  Date:  08/26/2022   ID:  Bonham, Zingale Oct 15, 1955, MRN [id number]  PCP:  Cyndi Bender, PA-C   Chief Complaint  Patient presents with   1 month follow up     Patient c/o shortness of breath most days. Medications reviewed by the patient verbally.     HPI:  66 year old gentleman with a history of  three vessel CAD,  CABG 01/2002, LIMA to the LAD, vein graft to the diagonal, vein graft to the OM and vein graft to the distal RCA.  cath 2014 showing patent grafts severe three-vessel disease. hyperlipidemia  hypertension,  smoking history for 23 years who stopped in 2003,   Chronic leukocytosis  followed by oncology  At [facility] , Dr. Phill Myron who presents for routine follow up of his coronary artery disease.   Last seen by myself in clinic Oct 2023 On last clinic visit we discussed a near syncope episode Workup with echo and Zio monitor as below, no acute findings noted Reports he has not had any further episodes of near syncope or syncope Active at home, hobbies involve buying and reselling books, lots of outside chores  EKG personally reviewed by myself on todays visit Normal sinus rhythm with rate 63 bpm old inferior MI, unable to exclude old anteroseptal infarct, no change from prior EKG  Imaging studies reviewed with him on today's visit Echo 10/23 Low normal ejection fraction 50 to 55%, improved from prior study in 8768 Diastolic dysfunction noted Right ventricular function No significant valvular heart disease  Zio monitor Patient had a min HR of 43 bpm 03:48am, 10/18 , max HR of 184 bpm, and avg HR of 63  bpm. Predominant underlying rhythm was Sinus Rhythm. 49  Supraventricular Tachycardia runs occurred, the run with the fastest  interval lasting 5 beats with a max rate of 184 bpm, the longest lasting  17.7 secs with an avg rate of 99 bpm. Isolated SVEs were rare (<1.0%),  SVE Couplets were rare (<1.0%), and SVE Triplets were rare  (<1.0%).  Isolated VEs were occasional (2.5%, 25300), VE Couplets were rare  (<1.0%, 333), and VE Triplets were rare (<1.0%, 19). Ventricular Bigeminy  and Trigeminy were present.   Number of Triggered Events and/or Diary Events: 4 triggered, 1 diary "may faint, Sweating, lightheaded, dizziness"  Findings within  45 sec of Triggers and/or associated with Diary Events:  sinus rhythm 57-81 bpm with occasional ectopy   Conclusions:  - Patient triggered/diary events corresponded to  sinus rhythm 57-81 bpm with occasional ectopy. This includes an episode 06/03/22 11:45am "may faint, Sweating, lightheaded, dizziness" during which sinus rhythm at 69bpm was recorded  -  49 SVT episodes, the longest of which lasted 17.7 seconds. No triggered/diary events were associated with SVT  -  Occasional PVC's, 2.5% overall burden   Seen in the ER at [facility] June 01, 2022 for near syncope Was sitting in a chair at home, felt warm, diaphoretic, dizzy Per wife he looked pale Blood pressure was low at home, EMS called Vitals normal per EMS Symptoms lasted 1 hour, felt normal by the time he reached the emergency room Similar episode 2 weeks earlier, at the time heart rate was 130 bpm Work-up in the ER negative, was discharged with Zio patch Troponin negative, EKG with normal sinus rhythm CTA head with no acute findings  other past medical history works 10 hours per day, 4 days per week  In heavy machinery erectile dysfunction symptoms He is not  on testosterone supplement as he was previously.  Reports that oncology does not want his testosterone high    previously reported some cramping in his legs , unclear if this is from pravastatin In the past he had frequent episodes of sweating, malaise/weakness initially felt to be secondary to bradycardia from metoprolol   previous 30 day monitor in October 2015 given his continued symptoms of sweating, malaise, weakness. Monitor did not show any significant  arrhythmia. No significant bradycardia noted    episode 03/28/2014 where he was working with his tools, sitting, started sweating all over. This happened at 9 AM. His work colleagues sat him in a chair and gave him something to drink. He felt weak for 3 days   catheterization at the end of 2014 for anginal symptoms showing patent grafts severe three-vessel disease. Medical management was recommended.   In recovery following a cardiac catheterization, he had episodes of sweating/flushing, malaise associated with bradycardia. Initially it was felt that this was a vagal response to pressure being applied to his groin. Later in recovery, he continued to have bradycardia. He was told to hold his metoprolol at the time of discharge. lushing, dizziness, malaise that he was having at home prior to the procedure resolved.  PMH:   has a past medical history of CAD (coronary artery disease), CHF (congestive heart failure) ([place]), Chronic fatigue, Complication of anesthesia, COPD (chronic obstructive pulmonary disease) ([place]), Dyspnea, HLD (hyperlipidemia), HTN (hypertension), Leukocytosis, Myocardial infarction ([place]), Pneumonia, Vertigo, and Wears dentures.  PSH:    Past Surgical History:  Procedure Laterality Date   APPENDECTOMY     APPENDECTOMY     CATARACT EXTRACTION W/PHACO Right 03/20/2020   Procedure: CATARACT EXTRACTION PHACO AND INTRAOCULAR LENS PLACEMENT ([place]) RIGHT;  Surgeon: Birder Robson, MD;  Location: [place];  Service: Ophthalmology;  Laterality: Right;  4.86 0:30.2   CATARACT EXTRACTION W/PHACO Left 04/10/2020   Procedure: CATARACT EXTRACTION PHACO AND INTRAOCULAR LENS PLACEMENT ([facility]) LEFT 4.99 00:27.6;  Surgeon: Birder Robson, MD;  Location: [place];  Service: Ophthalmology;  Laterality: Left;   COLONOSCOPY     CORONARY ARTERY BYPASS GRAFT  02/2002   4 vessel   HERNIA REPAIR     x 2   NECK SURGERY     cervical fusion    Current Outpatient Medications   Medication Sig Dispense Refill   ALBUTEROL IN Inhale into the lungs every 4 (four) hours as needed.     amLODipine (NORVASC) 10 MG tablet Take 1 tablet (10 mg total) by mouth daily. 90 tablet 3   aspirin 81 MG EC tablet Take 1 tablet (81 mg total) by mouth every morning. 90 tablet 3   BREZTRI AEROSPHERE 160-9-4.8 MCG/ACT AERO Inhale 2 puffs into the lungs 2 (two) times daily.     irbesartan (AVAPRO) 150 MG tablet Take 1 tablet (150 mg total) by mouth daily. 90 tablet 3   nitroGLYCERIN (NITROSTAT) 0.4 MG SL tablet DISSOLVE 1 TABLET UNDER THE TONGUE EVERY 5 MINUTES FOR UP TO 3 DOSES AS NEEDED FOR CHEST PAIN. IF NO RELIEF AFTER 3 DOSES, CALL 911 OR GO TO ER. 25 tablet 1   pravastatin (PRAVACHOL) 40 MG tablet Take 1 tablet (40 mg total) by mouth at bedtime. 90 tablet 3   SPIRIVA HANDIHALER 18 MCG inhalation capsule 1 capsule daily.     tadalafil (CIALIS) 20 MG tablet Take 20 mg by mouth daily as needed.     methylPREDNISolone (MEDROL) 4 MG tablet Take 1 tablet (4 mg  total) by mouth daily. (Patient not taking: Reported on 08/26/2022) 1 tablet 0   No current facility-administered medications for this visit.    Allergies:   Heparin (bovine), Codeine, Heparin, Penicillin g, Penicillins, and Tape   Social History:  The patient  reports that he quit smoking about 20 years ago. His smoking use included cigarettes. He has a 21.00 pack-year smoking history. He has never used smokeless tobacco. He reports current alcohol use of about 2.0 standard drinks of alcohol per week. He reports that he does not use drugs.   Family History:   family history includes Heart attack (age of onset: 82) in his father; Heart attack (age of onset: 78) in his mother.    Review of Systems: Review of Systems  Constitutional: Negative.   HENT: Negative.    Respiratory: Negative.    Cardiovascular: Negative.   Gastrointestinal: Negative.   Musculoskeletal: Negative.   Neurological: Negative.   Psychiatric/Behavioral:  Negative.    All other systems reviewed and are negative.  PHYSICAL EXAM: VS:  BP (!) 140/68 (BP Location: Left Arm, Patient Position: Sitting, Cuff Size: Normal)   Pulse 63   Ht '5\' 6"'$  (1.676 m)   Wt 192 lb (87.1 kg)   SpO2 98%   BMI 30.99 kg/m  , BMI Body mass index is 30.99 kg/m. Constitutional:  oriented to person, place, and time. No distress.  HENT:  Head: Grossly normal Eyes:  no discharge. No scleral icterus.  Neck: No JVD, no carotid bruits  Cardiovascular: Regular rate and rhythm, no murmurs appreciated Pulmonary/Chest: Clear to auscultation bilaterally, no wheezes or rails Abdominal: Soft.  no distension.  no tenderness.  Musculoskeletal: Normal range of motion Neurological:  normal muscle tone. Coordination normal. No atrophy Skin: Skin warm and dry Psychiatric: normal affect, pleasant  Recent Labs: No results found for requested labs within last 365 days.    Lipid Panel Lab Results  Component Value Date   CHOL 134 07/30/2012   HDL 46 07/30/2012   LDLCALC 61 07/30/2012   TRIG 135 07/30/2012    Wt Readings from Last 3 Encounters:  08/26/22 192 lb (87.1 kg)  06/03/22 190 lb 12.8 oz (86.5 kg)  12/02/21 196 lb 8 oz (89.1 kg)     ASSESSMENT AND PLAN:  Near syncope No further episodes since October 2022, previous concern for orthostasis Zio monitor and echo unrevealing Recommend he stay hydrated No further workup at this time  HYPERTENSION, BENIGN -  Blood pressure stable, recommend he continue amlodipine to the evening Irbesartan in the morning  Atherosclerosis of coronary artery bypass graft of native heart without angina pectoris Rare episodes of chest pain, recent near-syncope episodes  History of CABG  Prior Myoview April 2023, no high risk ischemia No further workup ordered at this time  Mixed hyperlipidemia Lab work per primary care Goal LDL less than 70  covid 19 pneumonia full recovery   Total encounter time more than 30 minutes   Greater than 50% was spent in counseling and coordination of care with the patient   No orders of the defined types were placed in this encounter.     Signed, Esmond Plants, [name], Ph.D. 08/26/2022  [place], [place]

## 2022-08-26 ENCOUNTER — Ambulatory Visit: Payer: 59 | Attending: Nurse Practitioner | Admitting: Cardiovascular Disease

## 2022-08-26 ENCOUNTER — Encounter: Payer: Self-pay | Admitting: Cardiovascular Disease

## 2022-08-26 VITALS — BP 140/68 | HR 63 | Ht 66.0 in | Wt 192.0 lb

## 2022-08-26 DIAGNOSIS — Z951 Presence of aortocoronary bypass graft: Secondary | ICD-10-CM

## 2022-08-26 DIAGNOSIS — I25708 Atherosclerosis of coronary artery bypass graft(s), unspecified, with other forms of angina pectoris: Secondary | ICD-10-CM | POA: Diagnosis not present

## 2022-08-26 DIAGNOSIS — E782 Mixed hyperlipidemia: Secondary | ICD-10-CM | POA: Diagnosis not present

## 2022-08-26 DIAGNOSIS — R0602 Shortness of breath: Secondary | ICD-10-CM

## 2022-08-26 DIAGNOSIS — I10 Essential (primary) hypertension: Secondary | ICD-10-CM

## 2022-08-26 DIAGNOSIS — J441 Chronic obstructive pulmonary disease with (acute) exacerbation: Secondary | ICD-10-CM

## 2022-08-26 MED ORDER — SILDENAFIL CITRATE 20 MG PO TABS: 20.0000 mg | ORAL_TABLET | Freq: Three times a day (TID) | ORAL | 1 refills | Status: DC

## 2022-08-26 NOTE — Patient Instructions (Addendum)
Medication Instructions:  No changes Viagra script sent in  If you need a refill on your cardiac medications before your next appointment, please call your pharmacy.   Lab work: No new labs needed  Testing/Procedures: No new testing needed  Follow-Up: At [facility], you and your health needs are our priority.  As part of our continuing mission to provide you with exceptional heart care, we have created designated Provider Care Teams.  These Care Teams include your primary Cardiologist (physician) and Advanced Practice Providers (APPs -  Physician Assistants and Nurse Practitioners) who all work together to provide you with the care you need, when you need it.  You will need a follow up appointment in 12 months  Providers on your designated Care Team:   Murray Hodgkins, NP Christell Faith, PA-C Cadence Kathlen Mody, [place]  COVID-19 Vaccine Information can be found at: [email or web address] For questions related to vaccine distribution or appointments, please email [email or web address] or call [phone].

## 2022-12-22 ENCOUNTER — Other Ambulatory Visit: Payer: Self-pay | Admitting: Cardiovascular Disease

## 2023-05-04 NOTE — Progress Notes (Addendum)
Virtual Visit via Telephone Note   Because of Bryceton R Indelicato's co-morbid illnesses, he is at least at moderate risk for complications without adequate follow up.  This format is felt to be most appropriate for this patient at this time.  The patient did not have access to video technology/had technical difficulties with video requiring transitioning to audio format only (telephone).  All issues noted in this document were discussed and addressed.  No physical exam could be performed with this format.  Please refer to the patient's chart for his consent to telehealth for [facility].   I connected with  Draper R Kittler on 05/05/23 by a video enabled telemedicine application and verified that I am speaking with the correct person using two identifiers. I am contacting the patient above from our cardiology clinic office or alternate office work station to their home, I discussed the limitations of evaluation and management by telemedicine. The patient expressed understanding and agreed to proceed.   Evaluation Performed:  Follow-up visit  Date:  05/05/2023   ID:  Meshulem, Dunagan April 11, 1956, MRN [id number]  Patient Location:  [address] [place] [address]   Provider location:   [place], [place] office  PCP:  Lonie Peak, PA-C  Cardiologist:  Fonnie Mu  Phone appointment time 4:00 to 4:20 PM May 05, 2023  Chief Complaint: Influenza positive, PVCs   History of Present Illness:    AHNAF GORMAN is a 67 y.o. male who presents via audio/video conferencing for a telehealth visit today.   The patient does not symptoms concerning for COVID-19 infection (fever, chills, cough, or new SHORTNESS OF BREATH).   With PMH of  three vessel CAD,  CABG 01/2002, LIMA to the LAD, vein graft to the diagonal, vein graft to the OM and vein graft to the distal RCA.  cath 2014 showing patent grafts severe three-vessel disease. hyperlipidemia   hypertension,  smoking history for 23 years who stopped in 2003,   Chronic leukocytosis  followed by oncology  At [facility] , Dr. Willette Pa who presents for routine follow up of his coronary artery disease.   Last seen by myself in clinic January 2024  Insulin is a +3 days ago  Micah Flesher to urgent care, EKG showing PVCs Feels the influenza has flared up his lungs Was given doxycycline, cough suppressant medication Robitussin DM Has not been taking Tylenol or ibuprofen Feels that he has some sweats, fever  Denies chest pain concerning for angina Has remained on his inhalers  Has follow-up with primary care next week  Prior imaging studies Echo 10/23 Low normal ejection fraction 50 to 55%, improved from prior study in 2017 Diastolic dysfunction noted Right ventricular function No significant valvular heart disease  Zio monitor Patient had a min HR of 43 bpm 03:48am, 10/18 , max HR of 184 bpm, and avg HR of 63  bpm. Predominant underlying rhythm was Sinus Rhythm. 49  Supraventricular Tachycardia runs occurred, the run with the fastest  interval lasting 5 beats with a max rate of 184 bpm, the longest lasting  17.7 secs with an avg rate of 99 bpm. Isolated SVEs were rare (<1.0%),  SVE Couplets were rare (<1.0%), and SVE Triplets were rare (<1.0%).  Isolated VEs were occasional (2.5%, 25300), VE Couplets were rare  (<1.0%, 333), and VE Triplets were rare (<1.0%, 19). Ventricular Bigeminy  and Trigeminy were present.   Number of Triggered Events and/or Diary Events: 4  triggered, 1 diary "may faint, Sweating, lightheaded, dizziness"  Findings within  45 sec of Triggers and/or associated with Diary Events:  sinus rhythm 57-81 bpm with occasional ectopy   Conclusions:  - Patient triggered/diary events corresponded to  sinus rhythm 57-81 bpm with occasional ectopy. This includes an episode 06/03/22 11:45am "may faint, Sweating, lightheaded, dizziness" during which sinus rhythm at 69bpm  was recorded  -  49 SVT episodes, the longest of which lasted 17.7 seconds. No triggered/diary events were associated with SVT  -  Occasional PVC's, 2.5% overall burden   Seen in the ER at [facility] June 01, 2022 for near syncope Was sitting in a chair at home, felt warm, diaphoretic, dizzy Per wife he looked pale Blood pressure was low at home, EMS called Vitals normal per EMS Symptoms lasted 1 hour, felt normal by the time he reached the emergency room Similar episode 2 weeks earlier, at the time heart rate was 130 bpm Work-up in the ER negative, was discharged with Zio patch Troponin negative, EKG with normal sinus rhythm CTA head with no acute findings  other past medical history works 10 hours per day, 4 days per week  In heavy machinery erectile dysfunction symptoms He is not on testosterone supplement as he was previously.  Reports that oncology does not want his testosterone high    previously reported some cramping in his legs , unclear if this is from pravastatin In the past he had frequent episodes of sweating, malaise/weakness initially felt to be secondary to bradycardia from metoprolol   previous 30 day monitor in October 2015 given his continued symptoms of sweating, malaise, weakness. Monitor did not show any significant arrhythmia. No significant bradycardia noted    episode 03/28/2014 where he was working with his tools, sitting, started sweating all over. This happened at 9 AM. His work colleagues sat him in a chair and gave him something to drink. He felt weak for 3 days   catheterization at the end of 2014 for anginal symptoms showing patent grafts severe three-vessel disease. Medical management was recommended.   In recovery following a cardiac catheterization, he had episodes of sweating/flushing, malaise associated with bradycardia. Initially it was felt that this was a vagal response to pressure being applied to his groin. Later in recovery, he continued to have  bradycardia. He was told to hold his metoprolol at the time of discharge. lushing, dizziness, malaise that he was having at home prior to the procedure resolved.    Past Medical History:  Diagnosis Date   CAD (coronary artery disease)    CHF (congestive heart failure) (HCC)    Chronic fatigue    Complication of anesthesia    BP drops.  Slow to wake.   COPD (chronic obstructive pulmonary disease) (HCC)    Dyspnea    with exertion   HLD (hyperlipidemia)    HTN (hypertension)    Leukocytosis    Myocardial infarction (HCC)    (x2)  last time 02/2002   Pneumonia    Hx of   Vertigo    1 episode, 5-6 yrs ago   Wears dentures    full upper   Past Surgical History:  Procedure Laterality Date   APPENDECTOMY     APPENDECTOMY     CATARACT EXTRACTION W/PHACO Right 03/20/2020   Procedure: CATARACT EXTRACTION PHACO AND INTRAOCULAR LENS PLACEMENT ([facility]) RIGHT;  Surgeon: Galen Manila, MD;  Location[facility];  Service: Ophthalmology;  Laterality: Right;  4.86 0:30.2   CATARACT EXTRACTION  W/PHACO Left 04/10/2020   Procedure: CATARACT EXTRACTION PHACO AND INTRAOCULAR LENS PLACEMENT ([facility]) LEFT 4.99 00:27.6;  Surgeon: Galen Manila, MD;  [facility];  Service: Ophthalmology;  Laterality: Left;   COLONOSCOPY     CORONARY ARTERY BYPASS GRAFT  02/2002   4 vessel   HERNIA REPAIR     x 2   NECK SURGERY     cervical fusion      Allergies:   Heparin (bovine), Codeine, Heparin, Penicillin g, Penicillins, and Tape   Social History   Tobacco Use   Smoking status: Former    Current packs/day: 0.00    Average packs/day: 1 pack/day for 21.0 years (21.0 ttl pk-yrs)    Types: Cigarettes    Start date: 03/20/1981    Quit date: 03/20/2002    Years since quitting: 21.1   Smokeless tobacco: Never   Tobacco comments:    Tobacco use - no  Vaping Use   Vaping status: Never Used  Substance Use Topics   Alcohol use: Yes    Alcohol/week: 2.0 standard drinks of  alcohol    Types: 2 Cans of beer per week    Comment: daily (most days)   Drug use: No     Current Outpatient Medications on File Prior to Visit  Medication Sig Dispense Refill   albuterol (VENTOLIN HFA) 108 (90 Base) MCG/ACT inhaler Inhale 1-2 puffs into the lungs every 4 (four) hours as needed.     amLODipine (NORVASC) 10 MG tablet TAKE 1 TABLET (10 MG TOTAL) BY MOUTH DAILY. 90 tablet 3   aspirin 81 MG EC tablet Take 1 tablet (81 mg total) by mouth every morning. 90 tablet 3   BREZTRI AEROSPHERE 160-9-4.8 MCG/ACT AERO Inhale 2 puffs into the lungs 2 (two) times daily.     doxycycline (VIBRA-TABS) 100 MG tablet Take 100 mg by mouth 2 (two) times daily.     irbesartan (AVAPRO) 150 MG tablet TAKE 1 TABLET (150 MG TOTAL) BY MOUTH DAILY. 90 tablet 3   nitroGLYCERIN (NITROSTAT) 0.4 MG SL tablet DISSOLVE 1 TABLET UNDER THE TONGUE EVERY 5 MINUTES FOR UP TO 3 DOSES AS NEEDED FOR CHEST PAIN. IF NO RELIEF AFTER 3 DOSES, CALL 911 OR GO TO ER. 25 tablet 1   oseltamivir (TAMIFLU) 75 MG capsule Take 75 mg by mouth 2 (two) times daily.     pravastatin (PRAVACHOL) 40 MG tablet TAKE 1 TABLET (40 MG TOTAL) BY MOUTH AT BEDTIME. 90 tablet 3   tadalafil (CIALIS) 20 MG tablet Take 20 mg by mouth daily as needed.     methylPREDNISolone (MEDROL) 4 MG tablet Take 1 tablet (4 mg total) by mouth daily. (Patient not taking: Reported on 08/26/2022) 1 tablet 0   sildenafil (REVATIO) 20 MG tablet Take 1 tablet (20 mg total) by mouth 3 (three) times daily. (Patient not taking: Reported on 05/05/2023) 90 tablet 1   SPIRIVA HANDIHALER 18 MCG inhalation capsule 1 capsule daily. (Patient not taking: Reported on 05/05/2023)     No current facility-administered medications on file prior to visit.     Family Hx: The patient's family history includes Heart attack (age of onset: 10) in his father; Heart attack (age of onset: 49) in his mother.  ROS:   Please see the history of present illness.    Review of Systems   Constitutional: Negative.   HENT: Negative.    Respiratory: Negative.    Cardiovascular: Negative.   Gastrointestinal: Negative.   Musculoskeletal: Negative.   Neurological:  Negative.   Psychiatric/Behavioral: Negative.    All other systems reviewed and are negative.    Labs/Other Tests and Data Reviewed:    Recent Labs: No results found for requested labs within last 365 days.   Recent Lipid Panel Lab Results  Component Value Date/Time   CHOL 134 07/30/2012 08:35 AM   TRIG 135 07/30/2012 08:35 AM   TRIG 108 09/08/2009 12:00 AM   HDL 46 07/30/2012 08:35 AM   CHOLHDL 2.9 07/30/2012 08:35 AM   CHOLHDL 3.2 09/19/2011 08:14 AM   LDLCALC 61 07/30/2012 08:35 AM    Wt Readings from Last 3 Encounters:  05/05/23 190 lb (86.2 kg)  08/26/22 192 lb (87.1 kg)  06/03/22 190 lb 12.8 oz (86.5 kg)     Exam:    Vital Signs: Vital signs may also be detailed in the HPI BP 122/76   Pulse 98   Ht 5\' 7"  (1.702 m)   Wt 190 lb (86.2 kg)   BMI 29.76 kg/m    Constitutional:  oriented to person, place, and time. No distress.  HENT:  Head: Grossly normal Eyes:  no discharge. No scleral icterus.  Neck: No JVD, no carotid bruits  Cardiovascular: Regular rate and rhythm, no murmurs appreciated Pulmonary/Chest: Clear to auscultation bilaterally, no wheezes or rails Abdominal: Soft.  no distension.  no tenderness.  Musculoskeletal: Normal range of motion Neurological:  normal muscle tone. Coordination normal. No atrophy Skin: Skin warm and dry Psychiatric: normal affect, pleasant  ASSESSMENT & PLAN:    Problem List Items Addressed This Visit       Cardiology Problems   Hyperlipidemia   HYPERTENSION, BENIGN   CAD, ARTERY BYPASS GRAFT - Primary     Other   COPD exacerbation (HCC)   Relevant Medications   albuterol (VENTOLIN HFA) 108 (90 Base) MCG/ACT inhaler   SOB (shortness of breath)   Other Visit Diagnoses     Hx of CABG       Near syncope          Influenza With  respiratory distress On doxycycline, Robitussin DM, has inhalers If symptoms get worse may need steroids/prednisone Would likely take several days to improve  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, [place], [place] [address]

## 2023-05-05 ENCOUNTER — Encounter: Payer: Self-pay | Admitting: Cardiovascular Disease

## 2023-05-05 ENCOUNTER — Telehealth: Payer: Self-pay

## 2023-05-05 ENCOUNTER — Ambulatory Visit: Payer: Medicare Other | Attending: Cardiovascular Disease | Admitting: Cardiovascular Disease

## 2023-05-05 VITALS — BP 122/76 | HR 98 | Ht 67.0 in | Wt 190.0 lb

## 2023-05-05 DIAGNOSIS — I10 Essential (primary) hypertension: Secondary | ICD-10-CM | POA: Diagnosis not present

## 2023-05-05 DIAGNOSIS — J441 Chronic obstructive pulmonary disease with (acute) exacerbation: Secondary | ICD-10-CM | POA: Insufficient documentation

## 2023-05-05 DIAGNOSIS — E782 Mixed hyperlipidemia: Secondary | ICD-10-CM | POA: Diagnosis not present

## 2023-05-05 DIAGNOSIS — R0602 Shortness of breath: Secondary | ICD-10-CM | POA: Diagnosis present

## 2023-05-05 DIAGNOSIS — R55 Syncope and collapse: Secondary | ICD-10-CM | POA: Insufficient documentation

## 2023-05-05 DIAGNOSIS — I25708 Atherosclerosis of coronary artery bypass graft(s), unspecified, with other forms of angina pectoris: Secondary | ICD-10-CM | POA: Diagnosis present

## 2023-05-05 DIAGNOSIS — Z951 Presence of aortocoronary bypass graft: Secondary | ICD-10-CM | POA: Diagnosis present

## 2023-05-05 NOTE — Telephone Encounter (Signed)
Patient Consent for Virtual Visit    [name], you are scheduled for a virtual visit with your provider today.  Just as we do with appointments in the office, we must obtain your consent to participate.  Your consent will be active for this visit and any virtual visit you may have with one of our providers in the next 365 days.  If you have a MyChart account, I can also send a copy of this consent to you electronically.  All virtual visits are billed to your insurance company just like a traditional visit in the office.  As this is a virtual visit, video technology does not allow for your provider to perform a traditional examination.  This may limit your provider's ability to fully assess your condition.  If your provider identifies any concerns that need to be evaluated in person or the need to arrange testing such as labs, EKG, etc, we will make arrangements to do so.  Although advances in technology are sophisticated, we cannot ensure that it will always work on either your end or our end.  If the connection with a video visit is poor, we may have to switch to a telephone visit.  With either a video or telephone visit, we are not always able to ensure that we have a secure connection.   I need to obtain your verbal consent now.   Are you willing to proceed with your visit today?        :[id number]}     KYHEEM BOULOS has provided verbal consent on 05/05/2023 for a virtual visit (video or telephone).   CONSENT FOR VIRTUAL VISIT FOR:  [name]  By participating in this virtual visit I agree to the following:  I hereby voluntarily request, consent and authorize [facility] and its employed or contracted physicians, physician assistants, nurse practitioners or other licensed health care professionals (the Practitioner), to provide me with telemedicine health care services (the "Services") as deemed necessary by the treating Practitioner. I acknowledge and consent to receive the Services  by the Practitioner via telemedicine. I understand that the telemedicine visit will involve communicating with the Practitioner through live audiovisual communication technology and the disclosure of certain medical information by electronic transmission. I acknowledge that I have been given the opportunity to request an in-person assessment or other available alternative prior to the telemedicine visit and am voluntarily participating in the telemedicine visit.  I understand that I have the right to withhold or withdraw my consent to the use of telemedicine in the course of my care at any time, without affecting my right to future care or treatment, and that the Practitioner or I may terminate the telemedicine visit at any time. I understand that I have the right to inspect all information obtained and/or recorded in the course of the telemedicine visit and may receive copies of available information for a reasonable fee.  I understand that some of the potential risks of receiving the Services via telemedicine include:  Delay or interruption in medical evaluation due to technological equipment failure or disruption; Information transmitted may not be sufficient (e.g. poor resolution of images) to allow for appropriate medical decision making by the Practitioner; and/or  In rare instances, security protocols could fail, causing a breach of personal health information.  Furthermore, I acknowledge that it is my responsibility to provide information about my medical history, conditions and care that is complete and accurate to the best of my ability. I acknowledge that  Practitioner's advice, recommendations, and/or decision may be based on factors not within their control, such as incomplete or inaccurate data provided by me or distortions of diagnostic images or specimens that may result from electronic transmissions. I understand that the practice of medicine is not an exact science and that Practitioner makes  no warranties or guarantees regarding treatment outcomes. I acknowledge that a copy of this consent can be made available to me via my patient portal [facility] MyChart), or I can request a printed copy by calling the office of [facility].    I understand that my insurance will be billed for this visit.   I have read or had this consent read to me. I understand the contents of this consent, which adequately explains the benefits and risks of the Services being provided via telemedicine.  I have been provided ample opportunity to ask questions regarding this consent and the Services and have had my questions answered to my satisfaction. I give my informed consent for the services to be provided through the use of telemedicine in my medical care

## 2023-05-05 NOTE — Patient Instructions (Signed)
Medication Instructions:  No changes  If you need a refill on your cardiac medications before your next appointment, please call your pharmacy.    Lab work: No new labs needed   Testing/Procedures: No new testing needed   Follow-Up: At [facility], you and your health needs are our priority.  As part of our continuing mission to provide you with exceptional heart care, we have created designated Provider Care Teams.  These Care Teams include your primary Cardiologist (physician) and Advanced Practice Providers (APPs -  Physician Assistants and Nurse Practitioners) who all work together to provide you with the care you need, when you need it.  You will need a follow up appointment in 3 months  Providers on your designated Care Team:   Christopher Berge, NP Ryan Dunn, PA-C Cadence Furth, PA-C  COVID-19 Vaccine Information can be found at: [email or web address] For questions related to vaccine distribution or appointments, please email [email or web address] or call [phone].   

## 2023-08-03 NOTE — Progress Notes (Unsigned)
Cardiology Office Note  Date:  08/03/2023   ID:  [name], [name] [birth year], MRN [id number]  PCP:  Lonie Peak, PA-C   No chief complaint on file.   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  cath 2014 showing patent grafts severe three-vessel disease. hyperlipidemia  hypertension,  smoking history for 23 years who stopped in 2003,   Chronic leukocytosis  followed by oncology  At [facility] , Dr. Willette Pa who presents for routine follow up of his coronary artery disease.   Last seen by myself on video visit September 2024 On that visit had influenza, was on antibiotics, had sweats and fever, followed by primary care    On last clinic visit we discussed a near syncope episode Workup with echo and Zio monitor as below, no acute findings noted Reports he has not had any further episodes of near syncope or syncope Active at home, hobbies involve buying and reselling books, lots of outside chores  EKG personally reviewed by myself on todays visit Normal sinus rhythm with rate 63 bpm old inferior MI, unable to exclude old anteroseptal infarct, no change from prior EKG  Imaging studies reviewed with him on today's visit Echo 10/23 Low normal ejection fraction 50 to 55%, improved from prior study in 2017 Diastolic dysfunction noted Right ventricular function No significant valvular heart disease  Zio monitor Patient had a min HR of 43 bpm 03:48am, 10/18 , max HR of 184 bpm, and avg HR of 63  bpm. Predominant underlying rhythm was Sinus Rhythm. 49  Supraventricular Tachycardia runs occurred, the run with the fastest  interval lasting 5 beats with a max rate of 184 bpm, the longest lasting  17.7 secs with an avg rate of 99 bpm. Isolated SVEs were rare (<1.0%),  SVE Couplets were rare (<1.0%), and SVE Triplets were rare (<1.0%).  Isolated  VEs were occasional (2.5%, 25300), VE Couplets were rare  (<1.0%, 333), and VE Triplets were rare (<1.0%, 19). Ventricular Bigeminy  and Trigeminy were present.   Number of Triggered Events and/or Diary Events: 4 triggered, 1 diary "may faint, Sweating, lightheaded, dizziness"  Findings within  45 sec of Triggers and/or associated with Diary Events:  sinus rhythm 57-81 bpm with occasional ectopy   Conclusions:  - Patient triggered/diary events corresponded to  sinus rhythm 57-81 bpm with occasional ectopy. This includes an episode 06/03/22 11:45am "may faint, Sweating, lightheaded, dizziness" during which sinus rhythm at 69bpm was recorded  -  49 SVT episodes, the longest of which lasted 17.7 seconds. No triggered/diary events were associated with SVT  -  Occasional PVC's, 2.5% overall burden   Seen in the ER at [facility] June 01, 2022 for near syncope Was sitting in a chair at home, felt warm, diaphoretic, dizzy Per wife he looked pale Blood pressure was low at home, EMS called Vitals normal per EMS Symptoms lasted 1 hour, felt normal by the time he reached the emergency room Similar episode 2 weeks earlier, at the time heart rate was 130 bpm Work-up in the ER negative, was discharged with Zio patch Troponin negative, EKG with normal sinus rhythm CTA head with no acute findings  other past medical history works 10 hours per day, 4 days per week  In heavy machinery erectile dysfunction symptoms He is not on testosterone supplement as he was  previously.  Reports that oncology does not want his testosterone high    previously reported some cramping in his legs , unclear if this is from pravastatin In the past he had frequent episodes of sweating, malaise/weakness initially felt to be secondary to bradycardia from metoprolol   previous 30 day monitor in October 2015 given his continued symptoms of sweating, malaise, weakness. Monitor did not show any significant arrhythmia. No significant  bradycardia noted    episode 03/28/2014 where he was working with his tools, sitting, started sweating all over. This happened at 9 AM. His work colleagues sat him in a chair and gave him something to drink. He felt weak for 3 days   catheterization at the end of 2014 for anginal symptoms showing patent grafts severe three-vessel disease. Medical management was recommended.   In recovery following a cardiac catheterization, he had episodes of sweating/flushing, malaise associated with bradycardia. Initially it was felt that this was a vagal response to pressure being applied to his groin. Later in recovery, he continued to have bradycardia. He was told to hold his metoprolol at the time of discharge. lushing, dizziness, malaise that he was having at home prior to the procedure resolved.  PMH:   has a past medical history of CAD (coronary artery disease), CHF (congestive heart failure) (HCC), Chronic fatigue, Complication of anesthesia, COPD (chronic obstructive pulmonary disease) (HCC), Dyspnea, HLD (hyperlipidemia), HTN (hypertension), Leukocytosis, Myocardial infarction (HCC), Pneumonia, Vertigo, and Wears dentures.  PSH:    Past Surgical History:  Procedure Laterality Date   APPENDECTOMY     APPENDECTOMY     CATARACT EXTRACTION W/PHACO Right 03/20/2020   Procedure: CATARACT EXTRACTION PHACO AND INTRAOCULAR LENS PLACEMENT (IOC) RIGHT;  Surgeon: Galen Manila, MD;  Location: [facility];  Service: Ophthalmology;  Laterality: Right;  4.86 0:30.2   CATARACT EXTRACTION W/PHACO Left 04/10/2020   Procedure: CATARACT EXTRACTION PHACO AND INTRAOCULAR LENS PLACEMENT (IOC) LEFT 4.99 00:27.6;  Surgeon: Galen Manila, MD;  Location: [facility];  Service: Ophthalmology;  Laterality: Left;   COLONOSCOPY     CORONARY ARTERY BYPASS GRAFT  02/2002   4 vessel   HERNIA REPAIR     x 2   NECK SURGERY     cervical fusion    Current Outpatient Medications  Medication Sig Dispense  Refill   albuterol (VENTOLIN HFA) 108 (90 Base) MCG/ACT inhaler Inhale 1-2 puffs into the lungs every 4 (four) hours as needed.     amLODipine (NORVASC) 10 MG tablet TAKE 1 TABLET (10 MG TOTAL) BY MOUTH DAILY. 90 tablet 3   aspirin 81 MG EC tablet Take 1 tablet (81 mg total) by mouth every morning. 90 tablet 3   BREZTRI AEROSPHERE 160-9-4.8 MCG/ACT AERO Inhale 2 puffs into the lungs 2 (two) times daily.     doxycycline (VIBRA-TABS) 100 MG tablet Take 100 mg by mouth 2 (two) times daily.     irbesartan (AVAPRO) 150 MG tablet TAKE 1 TABLET (150 MG TOTAL) BY MOUTH DAILY. 90 tablet 3   methylPREDNISolone (MEDROL) 4 MG tablet Take 1 tablet (4 mg total) by mouth daily. (Patient not taking: Reported on 08/26/2022) 1 tablet 0   nitroGLYCERIN (NITROSTAT) 0.4 MG SL tablet DISSOLVE 1 TABLET UNDER THE TONGUE EVERY 5 MINUTES FOR UP TO 3 DOSES AS NEEDED FOR CHEST PAIN. IF NO RELIEF AFTER 3 DOSES, CALL 911 OR GO TO ER. 25 tablet 1   oseltamivir (TAMIFLU) 75 MG capsule Take 75 mg by mouth 2 (two) times daily.  pravastatin (PRAVACHOL) 40 MG tablet TAKE 1 TABLET (40 MG TOTAL) BY MOUTH AT BEDTIME. 90 tablet 3   sildenafil (REVATIO) 20 MG tablet Take 1 tablet (20 mg total) by mouth 3 (three) times daily. (Patient not taking: Reported on 05/05/2023) 90 tablet 1   SPIRIVA HANDIHALER 18 MCG inhalation capsule 1 capsule daily. (Patient not taking: Reported on 05/05/2023)     tadalafil (CIALIS) 20 MG tablet Take 20 mg by mouth daily as needed.     No current facility-administered medications for this visit.    Allergies:   Heparin (bovine), Codeine, Heparin, Penicillin g, Penicillins, and Tape   Social History:  The patient  reports that he quit smoking about 21 years ago. His smoking use included cigarettes. He started smoking about 42 years ago. He has a 21 pack-year smoking history. He has never used smokeless tobacco. He reports current alcohol use of about 2.0 standard drinks of alcohol per week. He reports that he  does not use drugs.   Family History:   family history includes Heart attack (age of onset: 65) in his father; Heart attack (age of onset: 38) in his mother.    Review of Systems: Review of Systems  Constitutional: Negative.   HENT: Negative.    Respiratory: Negative.    Cardiovascular: Negative.   Gastrointestinal: Negative.   Musculoskeletal: Negative.   Neurological: Negative.   Psychiatric/Behavioral: Negative.    All other systems reviewed and are negative.  PHYSICAL EXAM: VS:  There were no vitals taken for this visit. , BMI There is no height or weight on file to calculate BMI. Constitutional:  oriented to person, place, and time. No distress.  HENT:  Head: Grossly normal Eyes:  no discharge. No scleral icterus.  Neck: No JVD, no carotid bruits  Cardiovascular: Regular rate and rhythm, no murmurs appreciated Pulmonary/Chest: Clear to auscultation bilaterally, no wheezes or rails Abdominal: Soft.  no distension.  no tenderness.  Musculoskeletal: Normal range of motion Neurological:  normal muscle tone. Coordination normal. No atrophy Skin: Skin warm and dry Psychiatric: normal affect, pleasant  Recent Labs: No results found for requested labs within last 365 days.    Lipid Panel Lab Results  Component Value Date   CHOL 134 07/30/2012   HDL 46 07/30/2012   LDLCALC 61 07/30/2012   TRIG 135 07/30/2012    Wt Readings from Last 3 Encounters:  05/05/23 190 lb (86.2 kg)  08/26/22 192 lb (87.1 kg)  06/03/22 190 lb 12.8 oz (86.5 kg)     ASSESSMENT AND PLAN:  Near syncope No further episodes since October 2022, previous concern for orthostasis Zio monitor and echo unrevealing Recommend he stay hydrated No further workup at this time  HYPERTENSION, BENIGN -  Blood pressure stable, recommend he continue amlodipine to the evening Irbesartan in the morning  Atherosclerosis of coronary artery bypass graft of native heart without angina pectoris Rare episodes of  chest pain, recent near-syncope episodes  History of CABG  Prior Myoview April 2023, no high risk ischemia No further workup ordered at this time  Mixed hyperlipidemia Lab work per primary care Goal LDL less than 70  covid 19 pneumonia full recovery   Total encounter time more than 30 minutes  Greater than 50% was spent in counseling and coordination of care with the patient   No orders of the defined types were placed in this encounter.     Signed, Dossie Arbour, [name], Ph.D. 08/03/2023  [facility] [place], [place] [phone]

## 2023-08-04 ENCOUNTER — Ambulatory Visit: Payer: Medicare Other | Attending: Cardiovascular Disease | Admitting: Cardiovascular Disease

## 2023-08-04 ENCOUNTER — Encounter: Payer: Self-pay | Admitting: Cardiovascular Disease

## 2023-08-04 VITALS — BP 120/60 | HR 67 | Ht 64.0 in | Wt 193.2 lb

## 2023-08-04 DIAGNOSIS — I25708 Atherosclerosis of coronary artery bypass graft(s), unspecified, with other forms of angina pectoris: Secondary | ICD-10-CM | POA: Insufficient documentation

## 2023-08-04 DIAGNOSIS — E782 Mixed hyperlipidemia: Secondary | ICD-10-CM | POA: Insufficient documentation

## 2023-08-04 DIAGNOSIS — I10 Essential (primary) hypertension: Secondary | ICD-10-CM | POA: Insufficient documentation

## 2023-08-04 DIAGNOSIS — R61 Generalized hyperhidrosis: Secondary | ICD-10-CM | POA: Diagnosis present

## 2023-08-04 DIAGNOSIS — Z79899 Other long term (current) drug therapy: Secondary | ICD-10-CM | POA: Diagnosis present

## 2023-08-04 DIAGNOSIS — R55 Syncope and collapse: Secondary | ICD-10-CM | POA: Insufficient documentation

## 2023-08-04 DIAGNOSIS — R0602 Shortness of breath: Secondary | ICD-10-CM | POA: Insufficient documentation

## 2023-08-04 DIAGNOSIS — J441 Chronic obstructive pulmonary disease with (acute) exacerbation: Secondary | ICD-10-CM | POA: Insufficient documentation

## 2023-08-04 DIAGNOSIS — Z951 Presence of aortocoronary bypass graft: Secondary | ICD-10-CM | POA: Diagnosis present

## 2023-08-04 MED ORDER — NITROGLYCERIN 0.4 MG SL SUBL: 0.4000 mg | SUBLINGUAL_TABLET | SUBLINGUAL | 1 refills | Status: AC | PRN

## 2023-08-04 NOTE — Patient Instructions (Addendum)
Referral to pulmonary for COPD, shortness of breath   Medication Instructions:  No changes  If you need a refill on your cardiac medications before your next appointment, please call your pharmacy.   Lab work: LFTs  and lipid panel  Testing/Procedures: No new testing needed  Follow-Up: At [facility], you and your health needs are our priority.  As part of our continuing mission to provide you with exceptional heart care, we have created designated Provider Care Teams.  These Care Teams include your primary Cardiologist (physician) and Advanced Practice Providers (APPs -  Physician Assistants and Nurse Practitioners) who all work together to provide you with the care you need, when you need it.  You will need a follow up appointment in 12 months  Providers on your designated Care Team:   Nicolasa Ducking, NP Eula Listen, PA-C Cadence Fransico Michael, [place]  COVID-19 Vaccine Information can be found at: [email or web address] For questions related to vaccine distribution or appointments, please email [email or web address] or call [phone].

## 2023-08-05 LAB — LIPID PANEL
Chol/HDL Ratio: 2.6 {ratio} (ref 0.0–5.0)
Cholesterol, Total: 135 mg/dL (ref 100–199)
HDL: 51 mg/dL (ref >39)
LDL Chol Calc (NIH): 68 mg/dL (ref 0–99)
Triglycerides: 80 mg/dL (ref 0–149)
VLDL Cholesterol Cal: 16 mg/dL (ref 5–40)

## 2023-08-05 LAB — HEPATIC FUNCTION PANEL
ALT: 18 [IU]/L (ref 0–44)
AST: 19 [IU]/L (ref 0–40)
Albumin: 4.3 g/dL (ref 3.9–4.9)
Alkaline Phosphatase: 130 [IU]/L — ABNORMAL HIGH (ref 44–121)
Bilirubin Total: 0.3 mg/dL (ref 0.0–1.2)
Bilirubin, Direct: 0.12 mg/dL (ref 0.00–0.40)
Total Protein: 6.3 g/dL (ref 6.0–8.5)

## 2023-08-10 ENCOUNTER — Encounter: Payer: Self-pay | Admitting: Pulmonary Disease

## 2023-08-10 ENCOUNTER — Ambulatory Visit (INDEPENDENT_AMBULATORY_CARE_PROVIDER_SITE_OTHER): Payer: Medicare Other | Admitting: Pulmonary Disease

## 2023-08-10 ENCOUNTER — Encounter: Payer: Self-pay | Admitting: Emergency Medicine

## 2023-08-10 VITALS — BP 134/78 | HR 67 | Temp 97.1°F | Ht 64.0 in | Wt 189.6 lb

## 2023-08-10 DIAGNOSIS — E66811 Obesity, class 1: Secondary | ICD-10-CM

## 2023-08-10 DIAGNOSIS — I25708 Atherosclerosis of coronary artery bypass graft(s), unspecified, with other forms of angina pectoris: Secondary | ICD-10-CM

## 2023-08-10 DIAGNOSIS — J449 Chronic obstructive pulmonary disease, unspecified: Secondary | ICD-10-CM | POA: Diagnosis not present

## 2023-08-10 DIAGNOSIS — I5189 Other ill-defined heart diseases: Secondary | ICD-10-CM

## 2023-08-10 DIAGNOSIS — Z6832 Body mass index (BMI) 32.0-32.9, adult: Secondary | ICD-10-CM

## 2023-08-10 DIAGNOSIS — R0602 Shortness of breath: Secondary | ICD-10-CM | POA: Diagnosis not present

## 2023-08-10 NOTE — Progress Notes (Addendum)
Subjective:    Patient ID: [name], male    DOB: 1956-06-27, 67 y.o.   MRN: [id number]  Patient Care Team: Lonie Peak, PA-C as PCP - General (Physician Assistant) Antonieta Iba, MD as Consulting Physician (Cardiology)  Chief Complaint  Patient presents with   Follow-up    DOE. Some wheezing. No cough.    BACKGROUND: Patient is a 67 year old former smoker who presents for evaluation of dyspnea on exertion and occasional wheezing.  He is kindly referred by Dr. Julien Nordmann.  Primary care practitioner is Lonie Peak, PA-C.  He was previously evaluated by Dr. Shane Crutch in 2016 for a similar issue and at that time diagnosed with bronchitis.  The patient did not follow up after initial visit.   HPI Discussed the use of AI scribe software for clinical note transcription with the patient, who gave verbal consent to proceed.  History of Present Illness   The patient is a 67 year old with a history of grade II diastolic dysfunction and coronary artery disease, who presents with shortness of breath. The patient reports that this symptom has been ongoing for several years, beginning after open heart surgery in 2003. The patient is a former smoker, with a history of smoking a pack a day for approximately 15-20 years.  The patient has been using Breztri for about a year, which seems to help with the shortness of breath. He also has a rescue inhaler, which is used infrequently. The patient reports that illness, such as a recent bout of flu, exacerbates the shortness of breath, requiring treatment with steroids.  The patient also reports difficulty sleeping, often waking up and unable to return to sleep. There is some nocturnal leg swelling reported. The patient has a history of coronary artery "blockages" that he states cannot be treated, which may be contributing to the symptoms.  The patient has had breathing tests in the past, but the results are not currently available.  The patient has been using the Breztri inhaler two puffs twice a day, and the albuterol rescue inhaler as needed. The patient has not tried using the rescue inhaler before exertion, which is often a trigger for the shortness of breath.   Prior surgical history is significant for hernia repairs, cervical discectomy, coronary artery bypass grafting x 4 in 2003 with subsequent issues with atelectasis postoperatively.  He is retired with no significant occupational exposures the past.  He had COVID-19 in 2022 without significant sequela afterwards.  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    Review of Systems A 10 point review of systems was performed and it is as noted above otherwise negative.   Past Medical History:  Diagnosis Date   CAD (coronary artery disease)    CHF (congestive heart failure) (HCC)    Chronic fatigue    Complication of anesthesia    BP drops.  Slow to wake.   COPD (chronic obstructive pulmonary disease) (HCC)    Dyspnea    with exertion   HLD (hyperlipidemia)    HTN (hypertension)    Leukocytosis    Myocardial infarction (HCC)    (x2)  last time 02/2002   Pneumonia    Hx of   Vertigo    1 episode, 5-6 yrs ago   Wears dentures    full upper    Past Surgical History:  Procedure Laterality Date   APPENDECTOMY     APPENDECTOMY     CATARACT EXTRACTION W/PHACO Right 03/20/2020   Procedure: CATARACT EXTRACTION  PHACO AND INTRAOCULAR LENS PLACEMENT (IOC) RIGHT;  Surgeon: Galen Manila, MD;  Location: [facility];  Service: Ophthalmology;  Laterality: Right;  4.86 0:30.2   CATARACT EXTRACTION W/PHACO Left 04/10/2020   Procedure: CATARACT EXTRACTION PHACO AND INTRAOCULAR LENS PLACEMENT (IOC) LEFT 4.99 00:27.6;  Surgeon: Galen Manila, MD;  Location: [facility];  Service: Ophthalmology;  Laterality: Left;   COLONOSCOPY     CORONARY ARTERY BYPASS GRAFT  02/2002   4 vessel   HERNIA REPAIR     x 2   NECK SURGERY     cervical fusion     Patient Active Problem List   Diagnosis Date Noted   Diaphoresis 08/03/2018   COPD exacerbation (HCC) 12/31/2015   SOB (shortness of breath) 12/31/2015   Hypokalemia 12/31/2015   Left foot pain 03/28/2013   Erectile dysfunction 01/28/2013   DIZZINESS 05/09/2010   DIAPHORESIS 05/09/2010   CHEST PAIN 05/09/2010   Hyperlipidemia 01/04/2010   HYPERTENSION, BENIGN 01/04/2010   CAD, ARTERY BYPASS GRAFT 01/04/2010    Family History  Problem Relation Age of Onset   Heart attack Mother 92   Heart attack Father 70    Social History   Tobacco Use   Smoking status: Former    Current packs/day: 0.00    Average packs/day: 1 pack/day for 21.0 years (21.0 ttl pk-yrs)    Types: Cigarettes    Start date: 03/20/1981    Quit date: 03/20/2002    Years since quitting: 21.4   Smokeless tobacco: Never   Tobacco comments:    Tobacco use - no  Substance Use Topics   Alcohol use: Yes    Alcohol/week: 2.0 standard drinks of alcohol    Types: 2 Cans of beer per week    Comment: daily (most days)    Allergies  Allergen Reactions   Heparin (Bovine) Anaphylaxis    Other reaction(s): Shock (ALLERGY)  ?Coma  Other reaction(s): Shock (ALLERGY) ?Coma ?Coma Other reaction(s): Shock (ALLERGY) ?Coma    ?Coma   Codeine Other (See Comments)    Breaks out into a sweat & rash  Reaction: Sweating (intolerance); Breaks out into a sweat & rash   Heparin Other (See Comments)    Respiratory failure  Other Reaction(s): respiratory failure   Penicillin G Other (See Comments)   Penicillins Other (See Comments) and Rash    Other reaction(s): Diaphoresis / Sweating (intolerance)  Has patient had a PCN reaction causing immediate rash, facial/tongue/throat swelling, SOB or lightheadedness with hypotension: Yes  Has patient had a PCN reaction causing severe rash involving mucus membranes or skin necrosis: No  Has patient had a PCN reaction that required hospitalization No  Has patient had a PCN  reaction occurring within the last 10 years: No  If all of the above answers are "NO", then may proceed with Cephalosporin use.  Other Reaction(s): sweating  Reaction: Diaphoresis / Sweating (intolerance)   Other reaction(s): Diaphoresis / Sweating (intolerance)  Has patient had a PCN reaction causing immediate rash, facial/tongue/throat swelling, SOB or lightheadedness with hypotension: Yes  Has patient had a PCN reaction causing severe rash involving mucus membranes or skin necrosis: No  Has patient had a PCN reaction that required hospitalization No  Has patient had a PCN reaction occurring within the last 10 years: No  If all of the above answers are "NO", then may proceed with Cephalosporin use.    Reaction: Diaphoresis / Sweating (intolerance)   Tape Itching and Rash   Wound Dressing Adhesive Itching and Rash  Current Meds  Medication Sig   albuterol (VENTOLIN HFA) 108 (90 Base) MCG/ACT inhaler Inhale 1-2 puffs into the lungs every 4 (four) hours as needed.   amLODipine (NORVASC) 5 MG tablet Take 5 mg by mouth daily.   aspirin 81 MG EC tablet Take 1 tablet (81 mg total) by mouth every morning.   BREZTRI AEROSPHERE 160-9-4.8 MCG/ACT AERO Inhale 2 puffs into the lungs 2 (two) times daily.   irbesartan (AVAPRO) 150 MG tablet TAKE 1 TABLET (150 MG TOTAL) BY MOUTH DAILY.   nitroGLYCERIN (NITROSTAT) 0.4 MG SL tablet Place 1 tablet (0.4 mg total) under the tongue every 5 (five) minutes as needed for chest pain.   pravastatin (PRAVACHOL) 40 MG tablet TAKE 1 TABLET (40 MG TOTAL) BY MOUTH AT BEDTIME.   tadalafil (CIALIS) 20 MG tablet Take 20 mg by mouth daily as needed.    Immunization History  Administered Date(s) Administered   Influenza-Unspecified 06/09/2023   Janssen (J&J) SARS-COV-2 Vaccination 12/02/2019   Tdap 06/09/2013      Objective:   BP 134/78 (BP Location: Right Arm, Cuff Size: Normal)   Pulse 67   Temp (!) 97.1 F (36.2 C)   Ht 5\' 4"  (1.626 m)   Wt 189 lb 9.6 oz  (86 kg)   SpO2 97%   BMI 32.54 kg/m   SpO2: 97 % O2 Device: None (Room air)  GENERAL: Well-developed, overweight gentleman, no acute distress.  Fully ambulatory, no conversational dyspnea. HEAD: Normocephalic, atraumatic.  EYES: Pupils equal, round, reactive to light.  No scleral icterus.  MOUTH: Upper dentures, oral mucosa moist.  No thrush. NECK: Supple. No thyromegaly. Trachea midline. No JVD.  No adenopathy. PULMONARY: Good air entry bilaterally.  Coarse, otherwise, no adventitious sounds. CARDIOVASCULAR: S1 and S2. Regular rate and rhythm.  No rubs, murmurs or gallops heard. ABDOMEN: Protuberant, otherwise benign. MUSCULOSKELETAL: No joint deformity, no clubbing, no edema.  NEUROLOGIC: No overt focal deficit, no gait disturbance, speech is fluent. SKIN: Intact,warm,dry. PSYCH: Mood and behavior normal.   Ambulatory oxymetry was performed today:  At rest on room air oxygen saturation was 96%, the patient ambulated at a fast pace, completed 3 laps, O2 nadir transient at 94% then remained 96% thereafter, slight shortness of breath.  Resting heart rate was 68 bpm at maximum for this exercise 84 bpm.    Assessment & Plan:     ICD-10-CM   1. SOB (shortness of breath)  R06.02 Pulmonary Function Test [facility] Only   Suspect multifactorial COPD/diastolic dysfunction/obesity/deconditioning    2. COPD suggested by initial evaluation (HCC)  J44.9     3. Class 1 obesity without serious comorbidity with body mass index (BMI) of 32.0 to 32.9 in adult, unspecified obesity type  E66.811    Z68.32     4. 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 Grade II diastolic dysfunction  I51.89      Orders Placed This Encounter  Procedures   Pulmonary Function Test [facility] Only    Standing Status:   Future    Expiration Date:   08/09/2024    Full PFT: includes the following: basic spirometry, spirometry pre & post  bronchodilator, diffusion capacity (DLCO), lung volumes:   Full PFT    This test can only be performed at:   [facility]   Discussion:    Shortness of Breath Chronic dyspnea exacerbated by exertion since open heart surgery in 2003. Likely  multifactorial, involving coronary artery disease and grade II diastolic dysfunction, COPD, obesity/deconditioning. Oxygen levels maintained during walking test, suggesting adequate lung function. Differential includes cardiac-pulmonary interaction. Discussed need for updated pulmonary function tests and potential medication adjustments based on results. - Order pulmonary function tests - Continue Breztri, two puffs BID - Rinse mouth after Breztri - Use emergency inhaler before strenuous activities - Follow-up in 4-6 weeks  Grade II Diastolic Dysfunction Echocardiogram shows vigorous systolic function but impaired diastolic relaxation, contributing to dyspnea. Blockages on the posterior heart are not surgically treatable. Emphasized importance of diastolic function for adequate cardiac oxygenation. - Review pulmonary function test results when available - Consider medication adjustments based on test results  Coronary Artery Disease Coronary artery disease with non-surgically treatable posterior lesions (per patient). May contribute to dyspnea. Need to coordinate with cardiologist if pulmonary function test shows dyspnea to be out of proportion to PFTs. - Coordinate with cardiologist if pulmonary function test indicates further cardiology workup necessary  General Health Maintenance Former smoker (15-20 pack-years). Using Vineyard for a year after Spiriva was ineffective. Infrequent use of rescue inhaler. Discussed importance of smoking cessation and prompt management of respiratory infections. - Encourage continued smoking cessation -patient has not had a relapse since 2003 - Monitor and manage respiratory infections promptly  Follow-up - Follow-up  in 4-6 weeks - Ensure pulmonary function test is completed before follow-up.      Advised if symptoms do not improve or worsen, to please contact office for sooner follow up or seek emergency care.    I spent 60 minutes of dedicated to the care of this patient on the date of this encounter to include pre-visit review of records, face-to-face time with the patient discussing conditions above, post visit ordering of testing, clinical documentation with the electronic health record, making appropriate referrals as documented, and communicating necessary findings to members of the patients care team.   C. Danice Goltz, MD Advanced Bronchoscopy P[facility]    *This note was dictated using voice recognition software/Dragon.  Despite best efforts to proofread, errors can occur which can change the meaning. Any transcriptional errors that result from this process are unintentional and may not be fully corrected at the time of dictation.

## 2023-08-10 NOTE — Patient Instructions (Signed)
VISIT SUMMARY:  Today, we discussed your ongoing shortness of breath, which has been a concern since your open heart surgery in 2003. We reviewed your current medications and symptoms, including your use of Breztri and your rescue inhaler. We also talked about your difficulty sleeping and nocturnal leg swelling. We have planned further tests and adjustments to better manage your symptoms.  YOUR PLAN:  -SHORTNESS OF BREATH: Your shortness of breath is likely due to a combination of heart and lung issues. We will continue your current medication, Markus Daft, and you should use your emergency inhaler before any strenuous activities. We have ordered a pulmonary function test to get more information about your lung function. Please rinse your mouth after using Breztri. We will review the test results and adjust your medications if needed.  -GRADE TWO DIASTOLIC DYSFUNCTION: This condition means your heart has trouble relaxing and filling with blood, which can cause shortness of breath. We will review the results of your pulmonary function test and may adjust your medications based on those results.  -CORONARY ARTERY DISEASE: This is a condition where the blood vessels that supply your heart are narrowed or blocked. Your cardiologist has released you for a year, but we may need to coordinate with them if your pulmonary function test suggests changes to your medication.  -GENERAL HEALTH MAINTENANCE: You have a history of smoking, but you have quit, which is excellent. Continue to avoid smoking and manage any respiratory infections promptly. We will monitor your condition and make adjustments as needed.  INSTRUCTIONS:  Please complete the pulmonary function test before your follow-up appointment in 4-6 weeks. Use your emergency inhaler before any strenuous activities and rinse your mouth after using Breztri. Follow up with [place] in 4-6 weeks to review your test results and adjust your treatment plan if necessary.

## 2023-08-14 ENCOUNTER — Encounter: Payer: Self-pay | Admitting: Pulmonary Disease

## 2023-08-25 ENCOUNTER — Ambulatory Visit: Payer: Medicare Other | Attending: Pulmonary Disease

## 2023-08-25 DIAGNOSIS — Z87891 Personal history of nicotine dependence: Secondary | ICD-10-CM | POA: Diagnosis not present

## 2023-08-25 DIAGNOSIS — R0602 Shortness of breath: Secondary | ICD-10-CM | POA: Diagnosis not present

## 2023-08-25 DIAGNOSIS — R0609 Other forms of dyspnea: Secondary | ICD-10-CM | POA: Insufficient documentation

## 2023-08-25 LAB — PULMONARY FUNCTION TEST ARMC ONLY
DL/VA % pred: 88 %
DL/VA: 3.78 ml/min/mmHg/L
DLCO unc % pred: 90 %
DLCO unc: 20.01 ml/min/mmHg
FEF 25-75 Post: 4.08 L/s
FEF 25-75 Pre: 2.02 L/s
FEF2575-%Change-Post: 102 %
FEF2575-%Pred-Post: 186 %
FEF2575-%Pred-Pre: 92 %
FEV1-%Change-Post: 20 %
FEV1-%Pred-Post: 101 %
FEV1-%Pred-Pre: 84 %
FEV1-Post: 2.73 L
FEV1-Pre: 2.28 L
FEV1FVC-%Change-Post: 5 %
FEV1FVC-%Pred-Pre: 103 %
FEV6-%Change-Post: 14 %
FEV6-%Pred-Post: 97 %
FEV6-%Pred-Pre: 85 %
FEV6-Post: 3.34 L
FEV6-Pre: 2.92 L
FEV6FVC-%Change-Post: 0 %
FEV6FVC-%Pred-Post: 106 %
FEV6FVC-%Pred-Pre: 105 %
FVC-%Change-Post: 13 %
FVC-%Pred-Post: 91 %
FVC-%Pred-Pre: 80 %
FVC-Post: 3.34 L
FVC-Pre: 2.94 L
Post FEV1/FVC ratio: 82 %
Post FEV6/FVC ratio: 100 %
Pre FEV1/FVC ratio: 77 %
Pre FEV6/FVC Ratio: 99 %
RV % pred: 98 %
RV: 1.98 L
TLC % pred: 89 %
TLC: 5.17 L

## 2023-08-25 MED ORDER — ALBUTEROL SULFATE (2.5 MG/3ML) 0.083% IN NEBU
2.5000 mg | INHALATION_SOLUTION | Freq: Once | RESPIRATORY_TRACT | Status: AC
  Administered 2023-08-25: 2.5 mg via RESPIRATORY_TRACT
  Filled 2023-08-25: qty 3

## 2023-09-18 ENCOUNTER — Encounter: Payer: Self-pay | Admitting: Pulmonary Disease

## 2023-09-18 ENCOUNTER — Ambulatory Visit (INDEPENDENT_AMBULATORY_CARE_PROVIDER_SITE_OTHER): Payer: Medicare Other | Admitting: Pulmonary Disease

## 2023-09-18 VITALS — BP 124/78 | HR 64 | Temp 97.1°F | Ht 64.0 in | Wt 190.8 lb

## 2023-09-18 DIAGNOSIS — I5189 Other ill-defined heart diseases: Secondary | ICD-10-CM | POA: Diagnosis not present

## 2023-09-18 DIAGNOSIS — J4530 Mild persistent asthma, uncomplicated: Secondary | ICD-10-CM

## 2023-09-18 DIAGNOSIS — R0602 Shortness of breath: Secondary | ICD-10-CM

## 2023-09-18 LAB — NITRIC OXIDE: Nitric Oxide: 12

## 2023-09-18 NOTE — Progress Notes (Signed)
Subjective:    Patient ID: [name], male    DOB: Apr 24, 1956, 68 y.o.   MRN: [id number]  Patient Care Team: Ellis Parents, FNP as PCP - General (Nurse Practitioner) Antonieta Iba, MD as Consulting Physician (Cardiology)  Chief Complaint  Patient presents with   Follow-up    SOB. No wheezing or cough.    BACKGROUND/INTERVAL: 68 year old remote former smoker follows for the issue of dyspnea on exertion and occasional wheezing.  He was initially evaluated on 10 August 2023.  HPI Discussed the use of AI scribe software for clinical note transcription with the patient, who gave verbal consent to proceed.  History of Present Illness   [name], a patient with a history of dyspnea, presents for a follow-up visit. The patient reports intermittent improvement and worsening of the shortness of breath. He recently recovered from a flu-like illness, which temporarily exacerbated his respiratory symptoms.  The patient has a history of cardiac issues, with a previous echocardiogram revealing grade II diastolic dysfunction, a condition that can contribute to dyspnea.  Last echocardiogram was October of 2023.  The patient's pulmonary function tests indicate a possible element of asthma, with lung function improving to 100% post-nebulizer use from a baseline of 84%. Despite this, the patient's shortness of breath does not appear to be primarily related to lung function.  His dyspnea is out of proportion to the very mild, reversible, airway obstruction.  The patient has a history of COVID-19 infection, with two episodes, the first of which resulted in a significant drop in oxygen levels. Following the first infection, the patient received monoclonal antibody infusions.  He did not notice any change on his dyspnea post COVID.  The patient also reports occasional cardiac spasms that cause clammy sweats, although these have not occurred recently.  I suspect he is referring to coronary  spasms.  He follows with Dr. Mariah Milling, cardiology.  The patient's sleep pattern is disrupted due to sleeping on a couch, with occasional episodes of rapid, shallow breathing during sleep. The patient also reports periods of daytime sluggishness and fatigue, which he attributes to his medication regimen.  Wife is present and states that the patient snores but does not seem to have apneic episodes.  Just the occasional rapid shallow breathing when he appears to be in a dream state.  He does not have daytime sleepiness other than after taking his cardiac medications when he feels sluggish.    Patient feels that Markus Daft does help his breathing somewhat.  However, it does not resolve all of his issues with exertional dyspnea.   DATA 06/04/2022 echocardiogram: LVEF is 50 to 55%, left ventricle with low normal function.  No regional wall motion abnormalities there is grade II DD.  RV function is normal.  Right ventricular size is mildly enlarged.  Left atrial size mildly dilated. 08/10/2023 ambulatory oximetry: No evidence of desaturation during exercise. 08/25/2023 PFTs: FEV1 2.28 L or 84% predicted, FVC 2.94 L or 80% predicted, FEV1/FVC 77%, there is significant bronchodilator response with FEV1 normalizing to 2.73 L or 100% predicted indicating a 20% net change postbronchodilator.  Lung volumes at lower limit of normal.  Diffusion capacity normal.  Consistent with mild obstructive airways disease of the asthmatic type.  Review of Systems A 10 point review of systems was performed and it is as noted above otherwise negative.   Patient Active Problem List   Diagnosis Date Noted   Diaphoresis 08/03/2018   COPD exacerbation (HCC) 12/31/2015   SOB (  shortness of breath) 12/31/2015   Hypokalemia 12/31/2015   Left foot pain 03/28/2013   Erectile dysfunction 01/28/2013   DIZZINESS 05/09/2010   DIAPHORESIS 05/09/2010   CHEST PAIN 05/09/2010   Hyperlipidemia 01/04/2010   HYPERTENSION, BENIGN 01/04/2010    CAD, ARTERY BYPASS GRAFT 01/04/2010    Social History   Tobacco Use   Smoking status: Former    Current packs/day: 0.00    Average packs/day: 1 pack/day for 21.0 years (21.0 ttl pk-yrs)    Types: Cigarettes    Start date: 03/20/1981    Quit date: 03/20/2002    Years since quitting: 21.5   Smokeless tobacco: Never   Tobacco comments:    Tobacco use - no  Substance Use Topics   Alcohol use: Yes    Alcohol/week: 2.0 standard drinks of alcohol    Types: 2 Cans of beer per week    Comment: daily (most days)    Allergies  Allergen Reactions   Heparin (Bovine) Anaphylaxis    Other reaction(s): Shock (ALLERGY)  ?Coma  Other reaction(s): Shock (ALLERGY) ?Coma ?Coma Other reaction(s): Shock (ALLERGY) ?Coma    ?Coma   Codeine Other (See Comments)    Breaks out into a sweat & rash  Reaction: Sweating (intolerance); Breaks out into a sweat & rash   Heparin Other (See Comments)    Respiratory failure  Other Reaction(s): respiratory failure   Penicillin G Other (See Comments)   Penicillins Other (See Comments) and Rash    Other reaction(s): Diaphoresis / Sweating (intolerance)  Has patient had a PCN reaction causing immediate rash, facial/tongue/throat swelling, SOB or lightheadedness with hypotension: Yes  Has patient had a PCN reaction causing severe rash involving mucus membranes or skin necrosis: No  Has patient had a PCN reaction that required hospitalization No  Has patient had a PCN reaction occurring within the last 10 years: No  If all of the above answers are "NO", then may proceed with Cephalosporin use.  Other Reaction(s): sweating  Reaction: Diaphoresis / Sweating (intolerance)   Other reaction(s): Diaphoresis / Sweating (intolerance)  Has patient had a PCN reaction causing immediate rash, facial/tongue/throat swelling, SOB or lightheadedness with hypotension: Yes  Has patient had a PCN reaction causing severe rash involving mucus membranes or skin necrosis: No   Has patient had a PCN reaction that required hospitalization No  Has patient had a PCN reaction occurring within the last 10 years: No  If all of the above answers are "NO", then may proceed with Cephalosporin use.    Reaction: Diaphoresis / Sweating (intolerance)   Tape Itching and Rash   Wound Dressing Adhesive Itching and Rash    Current Meds  Medication Sig   albuterol (VENTOLIN HFA) 108 (90 Base) MCG/ACT inhaler Inhale 1-2 puffs into the lungs every 4 (four) hours as needed.   amLODipine (NORVASC) 5 MG tablet Take 5 mg by mouth daily.   aspirin 81 MG EC tablet Take 1 tablet (81 mg total) by mouth every morning.   BREZTRI AEROSPHERE 160-9-4.8 MCG/ACT AERO Inhale 2 puffs into the lungs 2 (two) times daily.   irbesartan (AVAPRO) 150 MG tablet TAKE 1 TABLET (150 MG TOTAL) BY MOUTH DAILY.   nitroGLYCERIN (NITROSTAT) 0.4 MG SL tablet Place 1 tablet (0.4 mg total) under the tongue every 5 (five) minutes as needed for chest pain.   pravastatin (PRAVACHOL) 40 MG tablet TAKE 1 TABLET (40 MG TOTAL) BY MOUTH AT BEDTIME.   tadalafil (CIALIS) 20 MG tablet Take 20 mg by mouth  daily as needed.    Immunization History  Administered Date(s) Administered   Influenza-Unspecified 06/09/2023   Janssen (J&J) SARS-COV-2 Vaccination 12/02/2019   Tdap 06/09/2013        Objective:     BP 124/78 (BP Location: Right Arm, Cuff Size: Normal)   Pulse 64   Temp (!) 97.1 F (36.2 C)   Ht 5\' 4"  (1.626 m)   Wt 190 lb 12.8 oz (86.5 kg)   SpO2 98%   BMI 32.75 kg/m   SpO2: 98 % O2 Device: None (Room air)  GENERAL: Well-developed, overweight gentleman, no acute distress.  Fully ambulatory, no conversational dyspnea. HEAD: Normocephalic, atraumatic.  EYES: Pupils equal, round, reactive to light.  No scleral icterus.  MOUTH: Upper dentures, oral mucosa moist.  No thrush. NECK: Supple. No thyromegaly. Trachea midline. No JVD.  No adenopathy. PULMONARY: Good air entry bilaterally.  Coarse, otherwise, no  adventitious sounds. CARDIOVASCULAR: S1 and S2. Regular rate and rhythm.  No rubs, murmurs or gallops heard. ABDOMEN: Protuberant, otherwise benign. MUSCULOSKELETAL: No joint deformity, no clubbing, no edema.  NEUROLOGIC: No overt focal deficit, no gait disturbance, speech is fluent. SKIN: Intact,warm,dry. PSYCH: Mood and behavior normal. :  Lab Results  Component Value Date   NITRICOXIDE 12 09/18/2023  *No evidence of type II inflammation   Assessment & Plan:     ICD-10-CM   1. SOB (shortness of breath)  R06.02 Nitric oxide    ECHOCARDIOGRAM COMPLETE    Pulse oximetry, overnight    2. Mild persistent asthma without complication  J45.30     3. Grade II diastolic dysfunction  I51.89 ECHOCARDIOGRAM COMPLETE    Pulse oximetry, overnight      Orders Placed This Encounter  Procedures   Nitric oxide   Pulse oximetry, overnight    Standing Status:   Future    Expiration Date:   09/17/2024   ECHOCARDIOGRAM COMPLETE    Standing Status:   Future    Expected Date:   09/25/2023    Expiration Date:   09/17/2024    Where should this test be performed:   [facility] [place]    Perflutren DEFINITY (image enhancing agent) should be administered unless hypersensitivity or allergy exist:   Administer Perflutren    Reason for exam-Echo:   Dyspnea  R06.00    Other Comments:   Diastolic Dysfunction   Discussion:    Dyspnea Intermittent dyspnea. Lung function tests show 84% pre-nebulizer and 100% post-nebulizer, suggesting asthma. Normal diffusion capacity. Dyspnea may be related to diastolic dysfunction. Discussed need for echocardiogram to evaluate left ventricular stiffness, which can cause fatigue and shortness of breath. Emphasized importance of blood pressure control and physical activity. - Order echocardiogram - Order overnight oximetry - Nitric oxide 12 ppb  Mild Persistent Asthma Possible asthma indicated by improved lung function post-nebulizer. History of COVID-19 may have  contributed. Lung function: 84% pre-nebulizer, 100% post-nebulizer, indicating effective response to treatment. - Nitric oxide 12 ppb, no evidence of type II inflammation - Monitor lung function - Continue Breztri as he appears to be stable on this medication  Diastolic Dysfunction Diastolic dysfunction with left ventricular stiffness causing fatigue and shortness of breath. Last echocardiogram in 2023. Discussed heart's inability to relax properly, affecting its function. Emphasized blood pressure control and physical activity. Considered conditioning program with Dr. Windell Hummingbird approval. - Order echocardiogram - Patient encouraged to consult with Dr. Mariah Milling for conditioning program - Maintain blood pressure control - Encourage physical activity  Possible Nocturnal Hypoxemia Possible nocturnal hypoxemia.  No significant snoring or gasping reported.  Patient's wife does not report any apneic episodes witnessed.  Discussed need for overnight oximetry to assess nocturnal oxygen levels. - Order overnight oximetry  Follow-up - Follow up in 2-3 months.      Advised if symptoms do not improve or worsen, to please contact office for sooner follow up or seek emergency care.    I spent 40 minutes of dedicated to the care of this patient on the date of this encounter to include pre-visit review of records, face-to-face time with the patient discussing conditions above, post visit ordering of testing, clinical documentation with the electronic health record, making appropriate referrals as documented, and communicating necessary findings to members of the patients care team.     C. Danice Goltz, MD [facility]  [facility]    *This note was generated using voice recognition software/Dragon and/or AI transcription program.  Despite best efforts to proofread, errors can occur which can change the meaning. Any transcriptional errors that result from this process are  unintentional and may not be fully corrected at the time of dictation.

## 2023-09-18 NOTE — Addendum Note (Signed)
Addended by: Bonney Leitz on: 09/18/2023 02:05 PM   Modules accepted: Orders

## 2023-09-18 NOTE — Patient Instructions (Signed)
VISIT SUMMARY:  [name], you visited [place] today for a follow-up regarding your shortness of breath. We discussed your recent flu-like illness, history of cardiac issues, and possible asthma. We also reviewed your sleep patterns and daytime fatigue.  YOUR PLAN:  -DYSPNEA: Dyspnea means shortness of breath. Your lung function tests suggest asthma, but your symptoms may also be related to your heart's ability to relax properly. We will order an echocardiogram to evaluate your heart and an overnight oximetry to check your oxygen levels while you sleep. It's important to control your blood pressure and stay physically active.  -ASTHMA: Asthma is a condition where your airways narrow and swell, causing breathing difficulties. Your lung function improved significantly after using a nebulizer, indicating asthma. We will check for airway inflammation and continue to monitor your lung function.  -DIASTOLIC DYSFUNCTION: Diastolic dysfunction is when the heart has trouble relaxing and filling with blood, which can cause fatigue and shortness of breath. We will order an echocardiogram to assess your heart's function, consult with Dr. Lewie Loron about a conditioning program, and emphasize the importance of blood pressure control and physical activity.   INSTRUCTIONS:  Please follow up with [place] in 2-3 months. We will also schedule an echocardiogram and overnight oximetry to further evaluate your condition.

## 2023-10-02 ENCOUNTER — Telehealth: Payer: Self-pay | Admitting: Pulmonary Disease

## 2023-10-02 DIAGNOSIS — G4734 Idiopathic sleep related nonobstructive alveolar hypoventilation: Secondary | ICD-10-CM

## 2023-10-02 NOTE — Telephone Encounter (Signed)
 ONO result is in chart

## 2023-10-02 NOTE — Telephone Encounter (Signed)
 Reviewed overnight oximetry which has been scanned to "media".  Patient has desaturations to 78%.  She qualifies for nocturnal oxygen 2 L/min.

## 2023-10-05 ENCOUNTER — Telehealth: Payer: Self-pay | Admitting: Pulmonary Disease

## 2023-10-05 NOTE — Telephone Encounter (Signed)
 He should not have decreased the oxygen that low.  We can repeat the test if he prefers I suspect the results are known to be similar.

## 2023-10-05 NOTE — Telephone Encounter (Signed)
 I have printed the patient's notes and placed them at the front desk to be picked up. I have notified the patient.. Nothing further needed.

## 2023-10-05 NOTE — Telephone Encounter (Signed)
 I have notified the patient. He is ok with ordering the O2. I have placed the O2 order.  Nothing further needed.

## 2023-10-05 NOTE — Telephone Encounter (Signed)
 I notified the patient. He said he had a cold and chest congestion when he did the test. Would this affect his results?

## 2023-10-05 NOTE — Telephone Encounter (Signed)
 Patient would like copy of office notes and the ONO test. Would like to pick up tomorrow. Patient phone number is [phone].

## 2023-10-06 ENCOUNTER — Ambulatory Visit: Payer: Medicare Other | Attending: Pulmonary Disease

## 2023-10-06 DIAGNOSIS — I5189 Other ill-defined heart diseases: Secondary | ICD-10-CM | POA: Diagnosis not present

## 2023-10-06 DIAGNOSIS — R0602 Shortness of breath: Secondary | ICD-10-CM

## 2023-10-06 LAB — ECHOCARDIOGRAM COMPLETE
AV Mean grad: 3 mm[Hg]
AV Peak grad: 6.7 mm[Hg]
Ao pk vel: 1.29 m/s
Area-P 1/2: 4.68 cm2
S' Lateral: 4.8 cm

## 2023-10-08 ENCOUNTER — Telehealth: Payer: Self-pay | Admitting: Pulmonary Disease

## 2023-10-08 NOTE — Telephone Encounter (Signed)
[name] wife would like results of Echo. [name] phone number is [phone].

## 2023-10-08 NOTE — Telephone Encounter (Signed)
Overall cardiogram looks improved from prior.  No overt issues of concern.

## 2023-10-08 NOTE — Telephone Encounter (Signed)
Echo on 2/11.

## 2023-10-09 NOTE — Telephone Encounter (Signed)
I have reviewed it.  The findings are as noted.  I have not made a comment on the actual test because they generated a new message.

## 2023-10-12 NOTE — Telephone Encounter (Signed)
I spoke with the patient and his wife. They would like for you to call them back to go over their questions.  1) at the last office visit you said you were concerned about one of the chambers of his heart that looked like it was not functioning correctly. 2) Echo mentions congestive heart failure and they have never been told that he has it. 3) at what point should he see his Cardiologist? 4) Was seen at the Urgent Care over the weekend and had to be put on Prednisone. He was sick when he had his Echo, would this effect his results?

## 2023-10-12 NOTE — Telephone Encounter (Signed)
Pt is calling again to go over echo results. Is asking for a call back instead of [place]

## 2023-10-12 NOTE — Telephone Encounter (Signed)
I will be happy to discuss further with the patient and his wife however, the patient needs to move up his appointment so we can discuss this IN PERSON.

## 2023-10-12 NOTE — Telephone Encounter (Signed)
Patient has received results. 

## 2023-10-12 NOTE — Telephone Encounter (Signed)
I have notified the patient and scheduled him an appt for 2/20 at 2:15pm.  Nothing further needed.

## 2023-10-15 ENCOUNTER — Ambulatory Visit: Payer: BLUE CROSS/BLUE SHIELD | Admitting: Pulmonary Disease

## 2023-10-16 ENCOUNTER — Encounter: Payer: Self-pay | Admitting: Pulmonary Disease

## 2023-10-16 ENCOUNTER — Ambulatory Visit
Admission: RE | Admit: 2023-10-16 | Discharge: 2023-10-16 | Disposition: A | Discharge status: Home / Self Care | Payer: Medicare Other | Attending: Pulmonary Disease | Admitting: Pulmonary Disease

## 2023-10-16 ENCOUNTER — Ambulatory Visit (INDEPENDENT_AMBULATORY_CARE_PROVIDER_SITE_OTHER): Payer: Medicare Other | Admitting: Pulmonary Disease

## 2023-10-16 ENCOUNTER — Ambulatory Visit
Admission: RE | Admit: 2023-10-16 | Discharge: 2023-10-16 | Disposition: A | Discharge status: Home / Self Care | Payer: Medicare Other | Source: Ambulatory Visit | Attending: Pulmonary Disease | Admitting: Pulmonary Disease

## 2023-10-16 VITALS — BP 122/80 | HR 54 | Temp 97.1°F | Ht 64.0 in | Wt 182.9 lb

## 2023-10-16 DIAGNOSIS — J4530 Mild persistent asthma, uncomplicated: Secondary | ICD-10-CM

## 2023-10-16 DIAGNOSIS — G4734 Idiopathic sleep related nonobstructive alveolar hypoventilation: Secondary | ICD-10-CM | POA: Diagnosis not present

## 2023-10-16 DIAGNOSIS — R0602 Shortness of breath: Secondary | ICD-10-CM | POA: Diagnosis present

## 2023-10-16 LAB — NITRIC OXIDE: Nitric Oxide: 9

## 2023-10-16 NOTE — Progress Notes (Signed)
Subjective:    Patient ID: [name], male    DOB: 1955-11-04, 68 y.o.   MRN: [id number]  Patient Care Team: Ellis Parents, FNP as PCP - General (Nurse Practitioner) Antonieta Iba, MD as Consulting Physician (Cardiology)  Chief Complaint  Patient presents with   Follow-up    SOB. Wheezing. Cough, dry.    BACKGROUND/INTERVAL:68 year old remote former smoker follows for the issue of dyspnea on exertion and occasional wheezing. He was last seen on 18 September 2023.   HPI Discussed the use of AI scribe software for clinical note transcription with the patient, who gave verbal consent to proceed.  History of Present Illness   [name] is a 68 year old male with COPD who presents with shortness of breath.  He has been experiencing shortness of breath since a recent illness, with oxygen saturation levels fluctuating and episodes dropping below 88% during sleep. He uses a pulse oximeter at home, but there are concerns about its accuracy due to equipment issues and concurrent illness. His breathing sounds 'terrible' but his oxygen levels remain stable when active. He has been using Breztri for asthma control, which is effective, and his lung function improves significantly with inhaler or nebulizer use.  He has a history of heart disease for 25 years, with higher echocardiogram results indicating diastolic dysfunction. He is on cardiac medications prescribed by Dr. Mariah Milling, and recent echocardiogram shows improvement in this regard, although the global longitudinal strain remains abnormal.  He has been experiencing symptoms of an upper respiratory infection, although he tested negative for flu and COVID-19. He has been on doxycycline, with two days remaining, and recently completed a course of prednisone. He reports difficulty taking deep breaths and has been exposed to multiple viruses recently, likely brought home by his grandchild.  Pulmonary function tests show some asthma,  but his capacity to carry oxygen is good, he has significant improvement in FEV1 from 84% to 101% indicating asthmatic component. He continues to use Breztri for asthma control.  His dyspnea is out of proportion to his PFT findings.    DATA 06/04/2022 echocardiogram: LVEF is 50 to 55%, left ventricle with low normal function.  No regional wall motion abnormalities there is grade II DD.  RV function is normal.  Right ventricular size is mildly enlarged.  Left atrial size mildly dilated. 08/10/2023 ambulatory oximetry: No evidence of desaturation during exercise. 08/25/2023 PFTs: FEV1 2.28 L or 84% predicted, FVC 2.94 L or 80% predicted, FEV1/FVC 77%, there is significant bronchodilator response with FEV1 normalizing to 2.73 L or 100% predicted indicating a 20% net change postbronchodilator.  Lung volumes at lower limit of normal.  Diffusion capacity normal.  Consistent with mild obstructive airways disease, asthmatic type. 10/06/2023 echocardiogram: LVEF 55 to 60%, no regional wall motion normalities, parameters normal.  RV function is normal.  No evidence of mitral regurgitation.  Review of Systems A 10 point review of systems was performed and it is as noted above otherwise negative.   Patient Active Problem List   Diagnosis Date Noted   Diaphoresis 08/03/2018   COPD exacerbation (HCC) 12/31/2015   SOB (shortness of breath) 12/31/2015   Hypokalemia 12/31/2015   Left foot pain 03/28/2013   Erectile dysfunction 01/28/2013   DIZZINESS 05/09/2010   DIAPHORESIS 05/09/2010   CHEST PAIN 05/09/2010   Hyperlipidemia 01/04/2010   HYPERTENSION, BENIGN 01/04/2010   CAD, ARTERY BYPASS GRAFT 01/04/2010    Social History   Tobacco Use   Smoking status: Former  Current packs/day: 0.00    Average packs/day: 1 pack/day for 21.0 years (21.0 ttl pk-yrs)    Types: Cigarettes    Start date: 03/20/1981    Quit date: 03/20/2002    Years since quitting: 21.5   Smokeless tobacco: Never   Tobacco  comments:    Tobacco use - no  Substance Use Topics   Alcohol use: Yes    Alcohol/week: 2.0 standard drinks of alcohol    Types: 2 Cans of beer per week    Comment: daily (most days)    Allergies  Allergen Reactions   Heparin (Bovine) Anaphylaxis    Other reaction(s): Shock (ALLERGY)  ?Coma  Other reaction(s): Shock (ALLERGY) ?Coma ?Coma Other reaction(s): Shock (ALLERGY) ?Coma    ?Coma   Codeine Other (See Comments)    Breaks out into a sweat & rash  Reaction: Sweating (intolerance); Breaks out into a sweat & rash   Heparin Other (See Comments)    Respiratory failure  Other Reaction(s): respiratory failure   Penicillin G Other (See Comments)   Penicillins Other (See Comments) and Rash    Other reaction(s): Diaphoresis / Sweating (intolerance)  Has patient had a PCN reaction causing immediate rash, facial/tongue/throat swelling, SOB or lightheadedness with hypotension: Yes  Has patient had a PCN reaction causing severe rash involving mucus membranes or skin necrosis: No  Has patient had a PCN reaction that required hospitalization No  Has patient had a PCN reaction occurring within the last 10 years: No  If all of the above answers are "NO", then may proceed with Cephalosporin use.  Other Reaction(s): sweating  Reaction: Diaphoresis / Sweating (intolerance)   Other reaction(s): Diaphoresis / Sweating (intolerance)  Has patient had a PCN reaction causing immediate rash, facial/tongue/throat swelling, SOB or lightheadedness with hypotension: Yes  Has patient had a PCN reaction causing severe rash involving mucus membranes or skin necrosis: No  Has patient had a PCN reaction that required hospitalization No  Has patient had a PCN reaction occurring within the last 10 years: No  If all of the above answers are "NO", then may proceed with Cephalosporin use.    Reaction: Diaphoresis / Sweating (intolerance)   Tape Itching and Rash   Wound Dressing Adhesive Itching and Rash     Current Meds  Medication Sig   albuterol (VENTOLIN HFA) 108 (90 Base) MCG/ACT inhaler Inhale 1-2 puffs into the lungs every 4 (four) hours as needed.   amLODipine (NORVASC) 10 MG tablet Take 5 mg by mouth daily.   aspirin 81 MG EC tablet Take 1 tablet (81 mg total) by mouth every morning.   BREZTRI AEROSPHERE 160-9-4.8 MCG/ACT AERO Inhale 2 puffs into the lungs 2 (two) times daily.   doxycycline (VIBRA-TABS) 100 MG tablet Take 100 mg by mouth 2 (two) times daily.   irbesartan (AVAPRO) 150 MG tablet TAKE 1 TABLET (150 MG TOTAL) BY MOUTH DAILY.   nitroGLYCERIN (NITROSTAT) 0.4 MG SL tablet Place 1 tablet (0.4 mg total) under the tongue every 5 (five) minutes as needed for chest pain.   pravastatin (PRAVACHOL) 40 MG tablet TAKE 1 TABLET (40 MG TOTAL) BY MOUTH AT BEDTIME.   tadalafil (CIALIS) 20 MG tablet Take 20 mg by mouth daily as needed.    Immunization History  Administered Date(s) Administered   Influenza-Unspecified 06/09/2023   Janssen (J&J) SARS-COV-2 Vaccination 12/02/2019   Tdap 06/09/2013        Objective:     BP 122/80 (BP Location: Right Arm, Cuff Size: Normal)  Pulse (!) 54   Temp (!) 97.1 F (36.2 C)   Ht 5\' 4"  (1.626 m)   Wt 182 lb 14.4 oz (83 kg)   SpO2 98%   BMI 31.39 kg/m   SpO2: 98 % O2 Device: None (Room air)  GENERAL: Well-developed, overweight gentleman, no acute distress.  Fully ambulatory, no conversational dyspnea. HEAD: Normocephalic, atraumatic.  EYES: Pupils equal, round, reactive to light.  No scleral icterus.  MOUTH: Upper dentures, oral mucosa moist.  No thrush. NECK: Supple. No thyromegaly. Trachea midline. No JVD.  No adenopathy. PULMONARY: Good air entry bilaterally.  Coarse, otherwise, no adventitious sounds. CARDIOVASCULAR: S1 and S2. Regular rate and rhythm.  No rubs, murmurs or gallops heard. ABDOMEN: Protuberant, otherwise benign. MUSCULOSKELETAL: No joint deformity, no clubbing, no edema.  NEUROLOGIC: No overt focal deficit,  no gait disturbance, speech is fluent. SKIN: Intact,warm,dry. PSYCH: Mood and behavior normal.     Lab Results  Component Value Date   NITRICOXIDE 9 10/16/2023   Chest X-ray today some mild atelectatic changes in the bases no definitive infiltrate dependently reviewed, formal interpretation pending:   Assessment & Plan:     ICD-10-CM   1. Shortness of breath  R06.02 DG Chest 2 View    Nitric oxide    2. Mild persistent asthma without complication  J45.30     3. Nocturnal hypoxemia  G47.34      Orders Placed This Encounter  Procedures   DG Chest 2 View    Standing Status:   Future    Number of Occurrences:   1    Expected Date:   10/16/2023    Expiration Date:   10/15/2024    Reason for Exam (SYMPTOM  OR DIAGNOSIS REQUIRED):   Shortness of breath    Preferred imaging location?:   [facility]   Nitric oxide    Discussion:    Shortness of Breath [name] presents with shortness of breath since a recent illness. Pulse oximetry at home showed fluctuations, with episodes dropping below 88%. Lung function tests indicate asthma, but oxygen capacity is normal. Shortness of breath is not necessarily pulmonary as lung function is well controlled. He completed doxycycline and prednisone. - Order chest x-ray - Continue nighttime oxygen - Follow-up in April as scheduled - Call with chest x-ray results  Chronic Obstructive Pulmonary Disease (COPD)/asthma overlap, main component is asthma COPD likely contributes to respiratory symptoms. Echocardiogram was clouded by COPD, but lung function normalized post-inhaler/nebulizer. Likely stage 1 COPD/asthma overlap. Echocardiogram showed diastolic dysfunction has improved on medication. - Continue Breztri inhaler - Monitor lung function  Asthma Asthma component present but not severe. Lung function improved significantly post-nebulizer, indicating well-controlled asthma.  Nitric oxide was low at 9 ppb. - Continue Breztri  inhaler  Upper Respiratory Infection Recent upper respiratory infection, flu and COVID negative, likely contributing to symptoms. Completed doxycycline and prednisone. - Complete doxycycline course (2 more days) - Monitor for worsening symptoms  General Health Maintenance Discussed need for chest x-ray due to recent illness and ensuring proper follow-up. - Order chest x-ray (see above) - Follow-up in April as previously scheduled - Call with chest x-ray results.      Advised if symptoms do not improve or worsen, to please contact office for sooner follow up or seek emergency care.    I spent 40 minutes of dedicated to the care of this patient on the date of this encounter to include pre-visit review of records, face-to-face time with the patient discussing conditions  above, post visit ordering of testing, clinical documentation with the electronic health record, making appropriate referrals as documented, and communicating necessary findings to members of the patients care team.     C. Danice Goltz, MD Advanced Bronchoscopy PCCM [facility]    *This note was generated using voice recognition software/Dragon and/or AI transcription program.  Despite best efforts to proofread, errors can occur which can change the meaning. Any transcriptional errors that result from this process are unintentional and may not be fully corrected at the time of dictation.

## 2023-10-16 NOTE — Patient Instructions (Signed)
VISIT SUMMARY:  [name], a 68 year old male with COPD, visited today due to shortness of breath following a recent illness. His oxygen levels have been fluctuating, especially during sleep, and he has been using a pulse oximeter at home. He has a history of heart disease and has been experiencing symptoms of an upper respiratory infection. Lung function tests show some asthma, but his oxygen capacity improves significantly with inhaler or nebulizer use. He is currently on Breztri for asthma control and has been taking doxycycline and prednisone for the infection.  YOUR PLAN:  -SHORTNESS OF BREATH: Shortness of breath can be caused by various factors, including lung and heart conditions. Your lung function is well controlled, but we will order a chest x-ray to investigate further. Continue using nighttime oxygen and follow up in April. We will call you with the chest x-ray results.  -CHRONIC OBSTRUCTIVE PULMONARY DISEASE (COPD): COPD is a chronic lung condition that makes it hard to breathe. Your lung function normalized after using an inhaler or nebulizer, indicating likely stage 1 or 2 COPD. Continue using the Breztri inhaler and monitor your lung function.  -ASTHMA: Asthma is a condition where your airways narrow and swell, making it hard to breathe. Your asthma is well controlled with the [facility] inhaler, which you should continue using.  -UPPER RESPIRATORY INFECTION: An upper respiratory infection affects the nose, throat, and airways. You have tested negative for flu and COVID-19. Complete the remaining two days of your doxycycline course and monitor for any worsening symptoms.  INSTRUCTIONS:  Please complete the remaining two days of your doxycycline course. Continue using your nighttime oxygen and Breztri inhaler as prescribed. We have ordered a chest x-ray and will call you with the results. Follow up in April for further evaluation.

## 2023-10-21 ENCOUNTER — Ambulatory Visit: Payer: BLUE CROSS/BLUE SHIELD | Admitting: Pulmonary Disease

## 2023-12-17 ENCOUNTER — Encounter: Payer: Self-pay | Admitting: Pulmonary Disease

## 2023-12-17 ENCOUNTER — Ambulatory Visit: Payer: BLUE CROSS/BLUE SHIELD | Admitting: Pulmonary Disease

## 2023-12-17 VITALS — BP 120/78 | HR 60 | Temp 98.3°F | Ht 64.0 in | Wt 190.4 lb

## 2023-12-17 DIAGNOSIS — R0602 Shortness of breath: Secondary | ICD-10-CM | POA: Diagnosis not present

## 2023-12-17 DIAGNOSIS — Z01811 Encounter for preprocedural respiratory examination: Secondary | ICD-10-CM | POA: Diagnosis not present

## 2023-12-17 DIAGNOSIS — G4734 Idiopathic sleep related nonobstructive alveolar hypoventilation: Secondary | ICD-10-CM | POA: Diagnosis not present

## 2023-12-17 DIAGNOSIS — J4530 Mild persistent asthma, uncomplicated: Secondary | ICD-10-CM | POA: Diagnosis not present

## 2023-12-17 DIAGNOSIS — Z9189 Other specified personal risk factors, not elsewhere classified: Secondary | ICD-10-CM

## 2023-12-17 MED ORDER — BREZTRI AEROSPHERE 160-9-4.8 MCG/ACT IN AERO: 2.0000 | INHALATION_SPRAY | Freq: Two times a day (BID) | RESPIRATORY_TRACT | Status: DC

## 2023-12-17 NOTE — Progress Notes (Signed)
 Subjective:    Patient ID: [name], male    DOB: Nov 02, 1955, 68 y.o.   MRN: [id number]  Patient Care Team: Freida Jes, FNP as PCP - General (Nurse Practitioner) Devorah Fonder, MD as Consulting Physician (Cardiology)  Chief Complaint  Patient presents with   Follow-up    3 month: SOB, Asthma, and Hypoxia. Feno last time: 9.  Feeling much better, still has an occasional cough and minimal SOB. Been dealing with some insomnia from the oxygen machine and would like to be re-evaluated to see if he still needs it.    BACKGROUND/INTERVAL: 68 year old remote former smoker follows for the issue of dyspnea on exertion and occasional wheezing. He was last seen on 16 October 2023.   HPI Discussed the use of AI scribe software for clinical note transcription with the patient, who gave verbal consent to proceed.  History of Present Illness   [name] is a 68 year old male with asthma asthmatic bronchitis who presents with shortness of breath and nocturnal hypoxemia.  Patient presents with his wife.  He experiences shortness of breath, which has improved since his last visit. He uses Breztri  for asthma management, finding it effective but costly. The rising cost of the medication has impacted his ability to afford it, prompting him to explore mail-order pharmacy services to reduce expenses.  He has issues with nocturnal hypoxemia, requiring supplemental oxygen during sleep. However, the machine's noise disrupts his sleep, leading him to turn it off during the night. No significant snoring or daytime sleepiness is reported, but his wife notes he has shallow breathing during sleep. No observed cessation of breathing during sleep.  He finds the oxygen concentrator so disruptive to his sleep that he wants it removed from the home.  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 This condition has caused stress and impacted his ability to work. He is under  the care of an orthopedist, who is monitoring the condition and considering surgical options. He has had adverse reactions to sedation, which is a concern for potential surgery.      DATA 06/04/2022 echocardiogram: LVEF is 50 to 55%, left ventricle with low normal function.  No regional wall motion abnormalities there is grade II DD.  RV function is normal.  Right ventricular size is mildly enlarged.  Left atrial size mildly dilated. 08/10/2023 ambulatory oximetry: No evidence of desaturation during exercise. 08/25/2023 PFTs: FEV1 2.28 L or 84% predicted, FVC 2.94 L or 80% predicted, FEV1/FVC 77%, there is significant bronchodilator response with FEV1 normalizing to 2.73 L or 100% predicted indicating a 20% net change postbronchodilator.  Lung volumes at lower limit of normal.  Diffusion capacity normal.  Consistent with mild obstructive airways disease, asthmatic type. 10/06/2023 echocardiogram: LVEF 55 to 60%, no regional wall motion normalities, parameters normal.  RV function is normal.  No evidence of mitral regurgitation.  Review of Systems A 10 point review of systems was performed and it is as noted above otherwise negative.   Patient Active Problem List   Diagnosis Date Noted   Diaphoresis 08/03/2018   COPD exacerbation (HCC) 12/31/2015   SOB (shortness of breath) 12/31/2015   Hypokalemia 12/31/2015   Left foot pain 03/28/2013   Erectile dysfunction 01/28/2013   DIZZINESS 05/09/2010   DIAPHORESIS 05/09/2010   CHEST PAIN 05/09/2010   Hyperlipidemia 01/04/2010   HYPERTENSION, BENIGN 01/04/2010   CAD, ARTERY BYPASS GRAFT 01/04/2010    Social History  Tobacco Use   Smoking status: Former    Current packs/day: 0.00    Average packs/day: 1 pack/day for 21.0 years (21.0 ttl pk-yrs)    Types: Cigarettes    Start date: 03/20/1981    Quit date: 03/20/2002    Years since quitting: 21.7   Smokeless tobacco: Never   Tobacco comments:    Tobacco use - no  Substance Use Topics    Alcohol use: Yes    Alcohol/week: 2.0 standard drinks of alcohol    Types: 2 Cans of beer per week    Comment: daily (most days)    Allergies  Allergen Reactions   Heparin (Bovine) Anaphylaxis    Other reaction(s): Shock (ALLERGY)  ?Coma  Other reaction(s): Shock (ALLERGY) ?Coma ?Coma Other reaction(s): Shock (ALLERGY) ?Coma    ?Coma   Codeine Other (See Comments)    Breaks out into a sweat & rash  Reaction: Sweating (intolerance); Breaks out into a sweat & rash   Heparin Other (See Comments)    Respiratory failure  Other Reaction(s): respiratory failure   Penicillin G Other (See Comments)   Penicillins Other (See Comments) and Rash    Other reaction(s): Diaphoresis / Sweating (intolerance)  Has patient had a PCN reaction causing immediate rash, facial/tongue/throat swelling, SOB or lightheadedness with hypotension: Yes  Has patient had a PCN reaction causing severe rash involving mucus membranes or skin necrosis: No  Has patient had a PCN reaction that required hospitalization No  Has patient had a PCN reaction occurring within the last 10 years: No  If all of the above answers are "NO", then may proceed with Cephalosporin use.  Other Reaction(s): sweating  Reaction: Diaphoresis / Sweating (intolerance)   Other reaction(s): Diaphoresis / Sweating (intolerance)  Has patient had a PCN reaction causing immediate rash, facial/tongue/throat swelling, SOB or lightheadedness with hypotension: Yes  Has patient had a PCN reaction causing severe rash involving mucus membranes or skin necrosis: No  Has patient had a PCN reaction that required hospitalization No  Has patient had a PCN reaction occurring within the last 10 years: No  If all of the above answers are "NO", then may proceed with Cephalosporin use.    Reaction: Diaphoresis / Sweating (intolerance)   Tape Itching and Rash   Wound Dressing Adhesive Itching and Rash    Current Meds  Medication Sig   albuterol   (VENTOLIN  HFA) 108 (90 Base) MCG/ACT inhaler Inhale 2 puffs into the lungs.   amLODipine  (NORVASC ) 10 MG tablet Take 5 mg by mouth daily.   aspirin  81 MG EC tablet Take 1 tablet (81 mg total) by mouth every morning.   BREZTRI  AEROSPHERE 160-9-4.8 MCG/ACT AERO Inhale 2 puffs into the lungs 2 (two) times daily.   budeson-glycopyrrolate -formoterol  (BREZTRI  AEROSPHERE) 160-9-4.8 MCG/ACT AERO inhaler Inhale 2 puffs into the lungs in the morning and at bedtime.   irbesartan  (AVAPRO ) 150 MG tablet TAKE 1 TABLET (150 MG TOTAL) BY MOUTH DAILY.   nitroGLYCERIN  (NITROSTAT ) 0.4 MG SL tablet Place 1 tablet (0.4 mg total) under the tongue every 5 (five) minutes as needed for chest pain.   pravastatin  (PRAVACHOL ) 40 MG tablet TAKE 1 TABLET (40 MG TOTAL) BY MOUTH AT BEDTIME.   tadalafil  (CIALIS ) 20 MG tablet Take 20 mg by mouth daily as needed.    Immunization History  Administered Date(s) Administered   Influenza, Quadrivalent, Recombinant, Inj, Pf 06/03/2018   Influenza-Unspecified 06/09/2023   Janssen (J&J) SARS-COV-2 Vaccination 12/02/2019   Tdap 06/09/2013  Objective:     BP 120/78 (BP Location: Right Arm, Patient Position: Sitting, Cuff Size: Normal)   Pulse 60   Temp 98.3 F (36.8 C) (Oral)   Ht 5\' 4"  (1.626 m)   Wt 190 lb 6.4 oz (86.4 kg)   SpO2 96%   BMI 32.68 kg/m   SpO2: 96 %  GENERAL: Well-developed, overweight gentleman, no acute distress.  Fully ambulatory, no conversational dyspnea. HEAD: Normocephalic, atraumatic.  EYES: Pupils equal, round, reactive to light.  No scleral icterus.  MOUTH: Upper dentures, oral mucosa moist.  No thrush. NECK: Supple. No thyromegaly. Trachea midline. No JVD.  No adenopathy. PULMONARY: Good air entry bilaterally.  Coarse, otherwise, no adventitious sounds. CARDIOVASCULAR: S1 and S2. Regular rate and rhythm.  No rubs, murmurs or gallops heard. ABDOMEN: Protuberant, otherwise benign. MUSCULOSKELETAL: No joint deformity, no clubbing, no  edema.  NEUROLOGIC: No overt focal deficit, no gait disturbance, speech is fluent. SKIN: Intact,warm,dry. PSYCH: Mood and behavior normal.    Assessment & Plan:     ICD-10-CM   1. Mild persistent asthma without complication  J45.30     2. Shortness of breath  R06.02     3. Nocturnal hypoxia  G47.34 Ambulatory Referral for DME    4. Preoperative respiratory examination  Z01.811     5. At risk for sleep apnea  Z91.89 Home sleep test      Orders Placed This Encounter  Procedures   Ambulatory Referral for DME    Referral Priority:   Routine    Referral Type:   Durable Medical Equipment Purchase    Number of Visits Requested:   1   Home sleep test    Standing Status:   Future    Expected Date:   12/31/2023    Expiration Date:   12/16/2024    Where should this test be performed::   [facility] - Pul[facility]    Meds ordered this encounter  Medications   budeson-glycopyrrolate -formoterol  (BREZTRI  AEROSPHERE) 160-9-4.8 MCG/ACT AERO inhaler    Sig: Inhale 2 puffs into the lungs in the morning and at bedtime.   Discussion:    Nocturnal hypoxemia/sleep disordered breathing Nocturnal hypoxemia persists, necessitating reevaluation. The current oxygen concentrator is loud and disrupts sleep. Previous study may have been influenced by an upper respiratory infection. Differential diagnosis includes potential sleep apnea. - Order a home sleep study to evaluate oxygen levels and assess for sleep apnea, potentially providing a quieter alternative to the current machine. - Instruct to hold off on using the oxygen concentrator until after the sleep study results are available. - Consider CPAP if sleep study indicates sleep apnea and oxygen levels are inadequate.  Asthma/asthmatic bronchitis Asthma is well-controlled with current medication regimen. No significant cough or sputum production.  Pulmonary function is well-preserved, and the condition is well-compensated. Breztri  is effective but  expensive. - Provide samples of Breztri  to help manage costs. - Instruct to check with insurance for mail order pharmacy options to potentially reduce medication costs.       Advised if symptoms do not improve or worsen, to please contact office for sooner follow up or seek emergency care.    I spent 32 minutes of dedicated to the care of this patient on the date of this encounter to include pre-visit review of records, face-to-face time with the patient discussing conditions above, post visit ordering of testing, clinical documentation with the electronic health record, making appropriate referrals as documented, and communicating necessary findings to members of the patients care  team.     Acey Ace, MD Advanced Bronchoscopy PCCM [facility]    *This note was generated using voice recognition software/Dragon and/or AI transcription program.  Despite best efforts to proofread, errors can occur which can change the meaning. Any transcriptional errors that result from this process are unintentional and may not be fully corrected at the time of dictation.

## 2023-12-17 NOTE — Patient Instructions (Signed)
 VISIT SUMMARY:  Today, we discussed your ongoing issues with shortness of breath, nocturnal hypoxemia, and asthma management. We also reviewed your history of a dog bite and the resulting painful lipoma.  YOUR PLAN:  -NOCTURNAL HYPOXEMIA: Nocturnal hypoxemia means low oxygen levels during sleep. We will conduct a home sleep study to evaluate your oxygen levels and check for sleep apnea.  We are discontinuing the oxygen concentrator at your request. If the study shows sleep apnea and low oxygen levels, we may consider using a CPAP machine, which could be quieter and more effective.  -ASTHMA/ASTHMATIC BRONCHITIS: Asthma is a condition where your airways narrow and swell, making it difficult to breathe. Your asthma is well-controlled with Breztri , but the cost is high. We will provide you with samples of Breztri  and recommend checking with your insurance for mail-order pharmacy options to help reduce costs.  INSTRUCTIONS:  Please schedule and complete the home sleep study as soon as possible.  We are discontinuing the oxygen concentrator at your request. Check with your insurance about mail-order pharmacy options for Breztri  to help manage costs.  Will see you in follow-up in 2 months time call sooner should any new problems arise.

## 2024-01-01 ENCOUNTER — Encounter

## 2024-01-01 DIAGNOSIS — Z9189 Other specified personal risk factors, not elsewhere classified: Secondary | ICD-10-CM

## 2024-01-08 ENCOUNTER — Other Ambulatory Visit: Payer: Self-pay | Admitting: Cardiovascular Disease

## 2024-01-12 ENCOUNTER — Other Ambulatory Visit: Payer: Self-pay | Admitting: Cardiovascular Disease

## 2024-01-14 DIAGNOSIS — G4733 Obstructive sleep apnea (adult) (pediatric): Secondary | ICD-10-CM | POA: Diagnosis not present

## 2024-01-21 ENCOUNTER — Telehealth: Payer: Self-pay

## 2024-01-21 NOTE — Telephone Encounter (Signed)
 Copied from CRM [id number]. Topic: Clinical - Lab/Test Results >> Jan 21, 2024  3:16 PM Hilton Lucky wrote: Reason for CRM: Patient's spouse is calling in to request that results from recent SNAP study done in may be provided to them over the phone. States those results have been in MyChart for a week and they would like to have those results provided to them.

## 2024-02-04 NOTE — Telephone Encounter (Signed)
 Spoke with pt and wife and relayed results. He said he will discuss his options further during his appointment with Dr. Viva Grise on 7/3. No CPAP referral at this time.

## 2024-02-25 ENCOUNTER — Encounter: Payer: Self-pay | Admitting: Pulmonary Disease

## 2024-02-25 ENCOUNTER — Ambulatory Visit: Admitting: Pulmonary Disease

## 2024-02-25 VITALS — BP 124/76 | HR 61 | Temp 98.1°F | Ht 64.0 in | Wt 189.0 lb

## 2024-02-25 DIAGNOSIS — J4489 Other specified chronic obstructive pulmonary disease: Secondary | ICD-10-CM

## 2024-02-25 DIAGNOSIS — Z87891 Personal history of nicotine dependence: Secondary | ICD-10-CM

## 2024-02-25 DIAGNOSIS — G4733 Obstructive sleep apnea (adult) (pediatric): Secondary | ICD-10-CM | POA: Diagnosis not present

## 2024-02-25 DIAGNOSIS — I5189 Other ill-defined heart diseases: Secondary | ICD-10-CM

## 2024-02-25 DIAGNOSIS — J4540 Moderate persistent asthma, uncomplicated: Secondary | ICD-10-CM | POA: Diagnosis not present

## 2024-02-25 LAB — NITRIC OXIDE: Nitric Oxide: 12

## 2024-02-25 MED ORDER — BREZTRI AEROSPHERE 160-9-4.8 MCG/ACT IN AERO: 2.0000 | INHALATION_SPRAY | Freq: Two times a day (BID) | RESPIRATORY_TRACT | Status: AC

## 2024-02-25 NOTE — Patient Instructions (Signed)
 VISIT SUMMARY:  Today, you came in for a follow-up on your respiratory conditions, specifically asthma and sleep apnea. We discussed your current treatments and made some adjustments to help manage your conditions more effectively.  YOUR PLAN:  -SLEEP APNEA: Sleep apnea is a condition where your breathing stops and starts repeatedly during sleep. To help improve your nighttime oxygen levels and comfort, we recommend continuing with CPAP therapy using a small nasal mask. We will arrange for a CPAP setup with [facility], including a home visit by a respiratory therapist for fitting and instruction.  -ASTHMA: Asthma is a condition where your airways narrow and swell, making it difficult to breathe. Your asthma is well controlled with Breztri , although it is expensive. We will continue with Breztri  for your asthma management and provide you with samples. Additionally, we will assist you with applying for the Breztri  assistance program to help with the cost.  INSTRUCTIONS:  We will arrange for a CPAP setup with [facility], including a home visit by a respiratory therapist for fitting and instruction. Please continue using Breztri  for your asthma and use the samples provided. We will also help you apply for the Breztri  assistance program to manage the cost of your medication.

## 2024-02-25 NOTE — Progress Notes (Signed)
 Subjective:    Patient ID: [name], male    DOB: Aug 03, 1956, 68 y.o.   MRN: [id number]  Patient Care Team: Orlando Dwayne NOVAK, FNP as PCP - General (Nurse Practitioner) Perla Evalene PARAS, MD as Consulting Physician (Cardiology)  Chief Complaint  Patient presents with   Follow-up    Occasional shortness of breath on exertion.     BACKGROUND/INTERVAL:74-year-old remote former smoker follows for the issue of dyspnea on exertion and occasional wheezing. He was last seen on 17 December 2023.  Patient has moderate persistent asthma and has now been diagnosed with obstructive sleep apnea.  HPI Discussed the use of AI scribe software for clinical note transcription with the patient, who gave verbal consent to proceed.  History of Present Illness   [name] is a 68 year old male with asthma and sleep apnea who presents for management of his respiratory conditions.  He presents today with his wife, [name].  He was recently diagnosed with obstructive sleep apnea of moderate degree with AHI of 19.1.  We discussed the CPAP treatment of sleep apnea. He will be set up with [facility].  His asthma with COPD overlap is well controlled with Breztri , although he finds it very expensive. He has not experienced significant shortness of breath while on this medication.  He has noted that his wheezing is also very well-controlled.  He has not had any cough, sputum production, hemoptysis or any other issues.  No fevers, chills or sweats.  He experienced symptoms consistent with COVID-19 around Mother's Day, although he never tested positive. His symptoms were similar to those of a family member who did test positive.      DATA 06/04/2022 echocardiogram: LVEF is 50 to 55%, left ventricle with low normal function.  No regional wall motion abnormalities there is grade II DD.  RV function is normal.  Right ventricular size is mildly enlarged.  Left atrial size mildly dilated. 08/10/2023 ambulatory  oximetry: No evidence of desaturation during exercise. 08/25/2023 PFTs: FEV1 2.28 L or 84% predicted, FVC 2.94 L or 80% predicted, FEV1/FVC 77%, there is significant bronchodilator response with FEV1 normalizing to 2.73 L or 100% predicted indicating a 20% net change postbronchodilator.  Lung volumes at lower limit of normal.  Diffusion capacity normal.  Consistent with mild obstructive airways disease, asthmatic type. 10/06/2023 echocardiogram: LVEF 55 to 60%, no regional wall motion normalities, parameters normal.  RV function is normal.  No evidence of mitral regurgitation. 01/15/2024 Home sleep study: Moderate sleep apnea AHI 19.1, O2 desats to 88%.  Ordered AutoSet CPAP 5 to 20 cm H2O with mask of choice and heated humidity.  Review of Systems A 10 point review of systems was performed and it is as noted above otherwise negative.   Patient Active Problem List   Diagnosis Date Noted   Diaphoresis 08/03/2018   COPD exacerbation (HCC) 12/31/2015   SOB (shortness of breath) 12/31/2015   Hypokalemia 12/31/2015   Left foot pain 03/28/2013   Erectile dysfunction 01/28/2013   DIZZINESS 05/09/2010   DIAPHORESIS 05/09/2010   CHEST PAIN 05/09/2010   Hyperlipidemia 01/04/2010   HYPERTENSION, BENIGN 01/04/2010   CAD, ARTERY BYPASS GRAFT 01/04/2010    Social History   Tobacco Use   Smoking status: Former    Current packs/day: 0.00    Average packs/day: 1 pack/day for 21.0 years (21.0 ttl pk-yrs)    Types: Cigarettes    Start date: 03/20/1981    Quit date: 03/20/2002    Years  since quitting: 21.9   Smokeless tobacco: Never   Tobacco comments:    Tobacco use - no  Substance Use Topics   Alcohol use: Yes    Alcohol/week: 2.0 standard drinks of alcohol    Types: 2 Cans of beer per week    Comment: daily (most days)    Allergies  Allergen Reactions   Heparin (Bovine) Anaphylaxis    Other reaction(s): Shock (ALLERGY)  ?Coma  Other reaction(s): Shock (ALLERGY) ?Coma ?Coma Other  reaction(s): Shock (ALLERGY) ?Coma    ?Coma   Codeine Other (See Comments)    Breaks out into a sweat & rash  Reaction: Sweating (intolerance); Breaks out into a sweat & rash   Heparin Other (See Comments)    Respiratory failure  Other Reaction(s): respiratory failure   Penicillin G Other (See Comments)   Penicillins Other (See Comments) and Rash    Other reaction(s): Diaphoresis / Sweating (intolerance)  Has patient had a PCN reaction causing immediate rash, facial/tongue/throat swelling, SOB or lightheadedness with hypotension: Yes  Has patient had a PCN reaction causing severe rash involving mucus membranes or skin necrosis: No  Has patient had a PCN reaction that required hospitalization No  Has patient had a PCN reaction occurring within the last 10 years: No  If all of the above answers are NO, then may proceed with Cephalosporin use.  Other Reaction(s): sweating  Reaction: Diaphoresis / Sweating (intolerance)   Other reaction(s): Diaphoresis / Sweating (intolerance)  Has patient had a PCN reaction causing immediate rash, facial/tongue/throat swelling, SOB or lightheadedness with hypotension: Yes  Has patient had a PCN reaction causing severe rash involving mucus membranes or skin necrosis: No  Has patient had a PCN reaction that required hospitalization No  Has patient had a PCN reaction occurring within the last 10 years: No  If all of the above answers are NO, then may proceed with Cephalosporin use.    Reaction: Diaphoresis / Sweating (intolerance)   Tape Itching and Rash   Wound Dressing Adhesive Itching and Rash    Current Meds  Medication Sig   albuterol  (VENTOLIN  HFA) 108 (90 Base) MCG/ACT inhaler Inhale 2 puffs into the lungs.   amLODipine  (NORVASC ) 10 MG tablet TAKE 1 TABLET BY MOUTH EVERY DAY   aspirin  81 MG EC tablet Take 1 tablet (81 mg total) by mouth every morning.   BREZTRI  AEROSPHERE 160-9-4.8 MCG/ACT AERO Inhale 2 puffs into the lungs 2 (two) times  daily.   budeson-glycopyrrolate -formoterol  (BREZTRI  AEROSPHERE) 160-9-4.8 MCG/ACT AERO inhaler Inhale 2 puffs into the lungs in the morning and at bedtime.   budesonide -glycopyrrolate -formoterol  (BREZTRI  AEROSPHERE) 160-9-4.8 MCG/ACT AERO inhaler Inhale 2 puffs into the lungs in the morning and at bedtime.   irbesartan  (AVAPRO ) 150 MG tablet TAKE 1 TABLET BY MOUTH EVERY DAY   nitroGLYCERIN  (NITROSTAT ) 0.4 MG SL tablet Place 1 tablet (0.4 mg total) under the tongue every 5 (five) minutes as needed for chest pain.   pravastatin  (PRAVACHOL ) 40 MG tablet TAKE 1 TABLET (40 MG TOTAL) BY MOUTH AT BEDTIME.   sildenafil  (REVATIO ) 20 MG tablet Take 1 tablet (20 mg total) by mouth 3 (three) times daily.   tadalafil  (CIALIS ) 20 MG tablet Take 20 mg by mouth daily as needed.    Immunization History  Administered Date(s) Administered   Influenza, Quadrivalent, Recombinant, Inj, Pf 06/03/2018   Influenza-Unspecified 06/09/2023   Janssen (J&J) SARS-COV-2 Vaccination 12/02/2019   Tdap 06/09/2013        Objective:     BP  124/76 (BP Location: Left Arm, Patient Position: Sitting, Cuff Size: Normal)   Pulse 61   Temp 98.1 F (36.7 C) (Oral)   Ht 5' 4 (1.626 m)   Wt 189 lb (85.7 kg)   SpO2 97%   BMI 32.44 kg/m   SpO2: 97 %  GENERAL: Well-developed, overweight gentleman, no acute distress.  Fully ambulatory, no conversational dyspnea. HEAD: Normocephalic, atraumatic.  EYES: Pupils equal, round, reactive to light.  No scleral icterus.  MOUTH: Upper dentures, oral mucosa moist.  No thrush. NECK: Supple. No thyromegaly. Trachea midline. No JVD.  No adenopathy. PULMONARY: Good air entry bilaterally.  Coarse, otherwise, no adventitious sounds. CARDIOVASCULAR: S1 and S2. Regular rate and rhythm.  No rubs, murmurs or gallops heard. ABDOMEN: Protuberant, otherwise benign. MUSCULOSKELETAL: No joint deformity, no clubbing, no edema.  NEUROLOGIC: No overt focal deficit, no gait disturbance, speech is  fluent. SKIN: Intact,warm,dry. PSYCH: Mood and behavior normal.   Lab Results  Component Value Date   NITRICOXIDE 12 02/25/2024  *No evidence of significant type II inflammation     Assessment & Plan:     ICD-10-CM   1. Moderate persistent asthma without complication  J45.40     2. Grade II diastolic dysfunction  I51.89     3. OSA (obstructive sleep apnea)  G47.33 AMB REFERRAL FOR DME      Orders Placed This Encounter  Procedures   AMB REFERRAL FOR DME    Referral Priority:   Routine    Referral Type:   Durable Medical Equipment Purchase    Number of Visits Requested:   1    Meds ordered this encounter  Medications   budesonide -glycopyrrolate -formoterol  (BREZTRI  AEROSPHERE) 160-9-4.8 MCG/ACT AERO inhaler    Sig: Inhale 2 puffs into the lungs in the morning and at bedtime.    Dispense:  2 each    Lot Number?:   [id number]    Expiration Date?:   05/25/2026    Manufacturer?:   [employer] [[id number]]    NDC:   9689-5383-71 [[id number]]    Quantity:   2   Discussion:    Sleep apnea Moderate sleep apnea diagnosed. - Recommend CPAP therapy with a nasal mask to improve nighttime oxygenation and comfort. - Arrange for CPAP setup with [facility], including a home visit by a respiratory therapist for fitting and instruction.  Asthma/COPD overlap Asthma is well controlled with current medication regimen.  Nitric oxide  level 12 ppb today.  Breztri  is effective but expensive for him.  COPD is minor component. - Continue Breztri  for asthma management. - Provide samples of Breztri . - Assist with application for Breztri  assistance program.      Advised if symptoms do not improve or worsen, to please contact office for sooner follow up or seek emergency care.    I spent 35 minutes of dedicated to the care of this patient on the date of this encounter to include pre-visit review of records, face-to-face time with the patient discussing conditions above, post visit ordering of testing,  clinical documentation with the electronic health record, making appropriate referrals as documented, and communicating necessary findings to members of the patients care team.     C. Leita Sanders, MD Advanced Bronchoscopy P[facility]    *This note was generated using voice recognition software/Dragon and/or AI transcription program.  Despite best efforts to proofread, errors can occur which can change the meaning. Any transcriptional errors that result from this process are unintentional and may not be fully corrected at the time of  dictation.

## 2024-03-31 ENCOUNTER — Other Ambulatory Visit: Payer: Self-pay | Admitting: Cardiovascular Disease

## 2024-04-07 ENCOUNTER — Inpatient Hospital Stay (HOSPITAL_COMMUNITY)

## 2024-04-07 ENCOUNTER — Encounter (HOSPITAL_COMMUNITY): Payer: Self-pay

## 2024-04-07 ENCOUNTER — Inpatient Hospital Stay (HOSPITAL_COMMUNITY)
Admission: EM | Disposition: A | Discharge status: Home / Self Care | Payer: Self-pay | Source: Home / Self Care | Attending: Cardiology

## 2024-04-07 ENCOUNTER — Inpatient Hospital Stay (HOSPITAL_COMMUNITY)
Admission: EM | Admit: 2024-04-07 | Discharge: 2024-04-12 | DRG: 277 | Disposition: A | Discharge status: Home / Self Care | Attending: Cardiology | Admitting: Cardiology

## 2024-04-07 DIAGNOSIS — Z880 Allergy status to penicillin: Secondary | ICD-10-CM | POA: Diagnosis not present

## 2024-04-07 DIAGNOSIS — I462 Cardiac arrest due to underlying cardiac condition: Secondary | ICD-10-CM | POA: Diagnosis present

## 2024-04-07 DIAGNOSIS — I255 Ischemic cardiomyopathy: Secondary | ICD-10-CM

## 2024-04-07 DIAGNOSIS — Z7951 Long term (current) use of inhaled steroids: Secondary | ICD-10-CM

## 2024-04-07 DIAGNOSIS — I4729 Other ventricular tachycardia: Secondary | ICD-10-CM

## 2024-04-07 DIAGNOSIS — Z7982 Long term (current) use of aspirin: Secondary | ICD-10-CM

## 2024-04-07 DIAGNOSIS — R7989 Other specified abnormal findings of blood chemistry: Secondary | ICD-10-CM | POA: Diagnosis present

## 2024-04-07 DIAGNOSIS — Z885 Allergy status to narcotic agent status: Secondary | ICD-10-CM

## 2024-04-07 DIAGNOSIS — I5022 Chronic systolic (congestive) heart failure: Secondary | ICD-10-CM | POA: Diagnosis present

## 2024-04-07 DIAGNOSIS — E785 Hyperlipidemia, unspecified: Secondary | ICD-10-CM | POA: Diagnosis present

## 2024-04-07 DIAGNOSIS — Z79899 Other long term (current) drug therapy: Secondary | ICD-10-CM | POA: Diagnosis not present

## 2024-04-07 DIAGNOSIS — Z888 Allergy status to other drugs, medicaments and biological substances status: Secondary | ICD-10-CM

## 2024-04-07 DIAGNOSIS — I2510 Atherosclerotic heart disease of native coronary artery without angina pectoris: Secondary | ICD-10-CM | POA: Diagnosis present

## 2024-04-07 DIAGNOSIS — I252 Old myocardial infarction: Secondary | ICD-10-CM | POA: Diagnosis not present

## 2024-04-07 DIAGNOSIS — E876 Hypokalemia: Secondary | ICD-10-CM | POA: Diagnosis present

## 2024-04-07 DIAGNOSIS — I110 Hypertensive heart disease with heart failure: Secondary | ICD-10-CM | POA: Diagnosis present

## 2024-04-07 DIAGNOSIS — I469 Cardiac arrest, cause unspecified: Secondary | ICD-10-CM | POA: Diagnosis not present

## 2024-04-07 DIAGNOSIS — I472 Ventricular tachycardia, unspecified: Principal | ICD-10-CM

## 2024-04-07 DIAGNOSIS — J449 Chronic obstructive pulmonary disease, unspecified: Secondary | ICD-10-CM | POA: Diagnosis present

## 2024-04-07 DIAGNOSIS — D72829 Elevated white blood cell count, unspecified: Secondary | ICD-10-CM | POA: Diagnosis present

## 2024-04-07 DIAGNOSIS — R55 Syncope and collapse: Secondary | ICD-10-CM

## 2024-04-07 DIAGNOSIS — Z87891 Personal history of nicotine dependence: Secondary | ICD-10-CM

## 2024-04-07 DIAGNOSIS — Z8249 Family history of ischemic heart disease and other diseases of the circulatory system: Secondary | ICD-10-CM

## 2024-04-07 DIAGNOSIS — Z1152 Encounter for screening for COVID-19: Secondary | ICD-10-CM

## 2024-04-07 DIAGNOSIS — I4901 Ventricular fibrillation: Secondary | ICD-10-CM | POA: Diagnosis not present

## 2024-04-07 DIAGNOSIS — Z951 Presence of aortocoronary bypass graft: Secondary | ICD-10-CM

## 2024-04-07 DIAGNOSIS — Z91048 Other nonmedicinal substance allergy status: Secondary | ICD-10-CM | POA: Diagnosis not present

## 2024-04-07 DIAGNOSIS — I2582 Chronic total occlusion of coronary artery: Secondary | ICD-10-CM | POA: Diagnosis present

## 2024-04-07 HISTORY — PX: LEFT HEART CATH AND CORS/GRAFTS ANGIOGRAPHY: CATH118250

## 2024-04-07 LAB — CBC WITH DIFFERENTIAL/PLATELET
Abs Immature Granulocytes: 0.22 K/uL — ABNORMAL HIGH (ref 0.00–0.07)
Basophils Absolute: 0.1 K/uL (ref 0.0–0.1)
Basophils Relative: 0 %
Eosinophils Absolute: 0.3 K/uL (ref 0.0–0.5)
Eosinophils Relative: 2 %
HCT: 40.8 % (ref 39.0–52.0)
Hemoglobin: 14.1 g/dL (ref 13.0–17.0)
Immature Granulocytes: 1 %
Lymphocytes Relative: 6 %
Lymphs Abs: 1.4 K/uL (ref 0.7–4.0)
MCH: 31 pg (ref 26.0–34.0)
MCHC: 34.6 g/dL (ref 30.0–36.0)
MCV: 89.7 fL (ref 80.0–100.0)
Monocytes Absolute: 1.3 K/uL — ABNORMAL HIGH (ref 0.1–1.0)
Monocytes Relative: 6 %
Neutro Abs: 18 K/uL — ABNORMAL HIGH (ref 1.7–7.7)
Neutrophils Relative %: 85 %
Platelets: 224 K/uL (ref 150–400)
RBC: 4.55 MIL/uL (ref 4.22–5.81)
RDW: 12.5 % (ref 11.5–15.5)
WBC: 21.2 K/uL — ABNORMAL HIGH (ref 4.0–10.5)
nRBC: 0 % (ref 0.0–0.2)

## 2024-04-07 LAB — ECHOCARDIOGRAM COMPLETE
AR max vel: 2.41 cm2
AV Peak grad: 6.9 mmHg
Ao pk vel: 1.31 m/s
Area-P 1/2: 4.8 cm2
S' Lateral: 4.4 cm
Weight: 3012.37 [oz_av]

## 2024-04-07 LAB — CBC
HCT: 44.1 % (ref 39.0–52.0)
Hemoglobin: 15.1 g/dL (ref 13.0–17.0)
MCH: 31.2 pg (ref 26.0–34.0)
MCHC: 34.2 g/dL (ref 30.0–36.0)
MCV: 91.1 fL (ref 80.0–100.0)
Platelets: 211 K/uL (ref 150–400)
RBC: 4.84 MIL/uL (ref 4.22–5.81)
RDW: 12.5 % (ref 11.5–15.5)
WBC: 20.9 K/uL — ABNORMAL HIGH (ref 4.0–10.5)
nRBC: 0 % (ref 0.0–0.2)

## 2024-04-07 LAB — POCT I-STAT, CHEM 8
BUN: 12 mg/dL (ref 8–23)
Calcium, Ion: 1.2 mmol/L (ref 1.15–1.40)
Chloride: 105 mmol/L (ref 98–111)
Creatinine, Ser: 0.9 mg/dL (ref 0.61–1.24)
Glucose, Bld: 144 mg/dL — ABNORMAL HIGH (ref 70–99)
HCT: 41 % (ref 39.0–52.0)
Hemoglobin: 13.9 g/dL (ref 13.0–17.0)
Potassium: 3.4 mmol/L — ABNORMAL LOW (ref 3.5–5.1)
Sodium: 140 mmol/L (ref 135–145)
TCO2: 19 mmol/L — ABNORMAL LOW (ref 22–32)

## 2024-04-07 LAB — COMPREHENSIVE METABOLIC PANEL WITH GFR
ALT: 57 U/L — ABNORMAL HIGH (ref 0–44)
AST: 66 U/L — ABNORMAL HIGH (ref 15–41)
Albumin: 3.8 g/dL (ref 3.5–5.0)
Alkaline Phosphatase: 104 U/L (ref 38–126)
Anion gap: 13 (ref 5–15)
BUN: 12 mg/dL (ref 8–23)
CO2: 19 mmol/L — ABNORMAL LOW (ref 22–32)
Calcium: 8.8 mg/dL — ABNORMAL LOW (ref 8.9–10.3)
Chloride: 107 mmol/L (ref 98–111)
Creatinine, Ser: 0.95 mg/dL (ref 0.61–1.24)
GFR, Estimated: >60 mL/min (ref >60)
Glucose, Bld: 145 mg/dL — ABNORMAL HIGH (ref 70–99)
Potassium: 3.4 mmol/L — ABNORMAL LOW (ref 3.5–5.1)
Sodium: 139 mmol/L (ref 135–145)
Total Bilirubin: 0.8 mg/dL (ref 0.0–1.2)
Total Protein: 6.4 g/dL — ABNORMAL LOW (ref 6.5–8.1)

## 2024-04-07 LAB — LIPID PANEL
Cholesterol: 133 mg/dL (ref 0–200)
HDL: 43 mg/dL (ref >40)
LDL Cholesterol: 76 mg/dL (ref 0–99)
Total CHOL/HDL Ratio: 3.1 ratio
Triglycerides: 69 mg/dL (ref <150)
VLDL: 14 mg/dL (ref 0–40)

## 2024-04-07 LAB — TROPONIN I (HIGH SENSITIVITY)
Troponin I (High Sensitivity): 163 ng/L (ref <18)
Troponin I (High Sensitivity): 677 ng/L (ref <18)

## 2024-04-07 LAB — BASIC METABOLIC PANEL WITH GFR
Anion gap: 9 (ref 5–15)
BUN: 12 mg/dL (ref 8–23)
CO2: 20 mmol/L — ABNORMAL LOW (ref 22–32)
Calcium: 8.7 mg/dL — ABNORMAL LOW (ref 8.9–10.3)
Chloride: 108 mmol/L (ref 98–111)
Creatinine, Ser: 0.77 mg/dL (ref 0.61–1.24)
GFR, Estimated: >60 mL/min (ref >60)
Glucose, Bld: 141 mg/dL — ABNORMAL HIGH (ref 70–99)
Potassium: 4.2 mmol/L (ref 3.5–5.1)
Sodium: 137 mmol/L (ref 135–145)

## 2024-04-07 LAB — PROTIME-INR
INR: 1.1 (ref 0.8–1.2)
Prothrombin Time: 14.6 s (ref 11.4–15.2)

## 2024-04-07 LAB — CG4 I-STAT (LACTIC ACID): Lactic Acid, Venous: 1.9 mmol/L (ref 0.5–1.9)

## 2024-04-07 LAB — APTT: aPTT: 31 s (ref 24–36)

## 2024-04-07 LAB — HEMOGLOBIN A1C
Hgb A1c MFr Bld: 5.4 % (ref 4.8–5.6)
Mean Plasma Glucose: 108.28 mg/dL

## 2024-04-07 LAB — MRSA NEXT GEN BY PCR, NASAL: MRSA by PCR Next Gen: NOT DETECTED

## 2024-04-07 LAB — MAGNESIUM
Magnesium: 2.1 mg/dL (ref 1.7–2.4)
Magnesium: 2.1 mg/dL (ref 1.7–2.4)

## 2024-04-07 LAB — LACTIC ACID, PLASMA: Lactic Acid, Venous: 1.5 mmol/L (ref 0.5–1.9)

## 2024-04-07 MED ORDER — FENTANYL CITRATE (PF) 100 MCG/2ML IJ SOLN
INTRAMUSCULAR | Status: DC | PRN
  Administered 2024-04-07: 25 ug via INTRAVENOUS

## 2024-04-07 MED ORDER — LIDOCAINE HCL (PF) 1 % IJ SOLN
INTRAMUSCULAR | Status: DC | PRN
  Administered 2024-04-07: 5 mL via INTRADERMAL

## 2024-04-07 MED ORDER — PERFLUTREN LIPID MICROSPHERE
1.0000 mL | INTRAVENOUS | Status: AC | PRN
  Administered 2024-04-07: 2 mL via INTRAVENOUS

## 2024-04-07 MED ORDER — FENTANYL CITRATE PF 50 MCG/ML IJ SOSY
PREFILLED_SYRINGE | INTRAMUSCULAR | Status: AC
  Filled 2024-04-07: qty 1

## 2024-04-07 MED ORDER — PRAVASTATIN SODIUM 40 MG PO TABS
40.0000 mg | ORAL_TABLET | Freq: Every day | ORAL | Status: DC
  Administered 2024-04-07: 40 mg via ORAL
  Filled 2024-04-07: qty 1

## 2024-04-07 MED ORDER — POTASSIUM CHLORIDE CRYS ER 20 MEQ PO TBCR
20.0000 meq | EXTENDED_RELEASE_TABLET | Freq: Once | ORAL | Status: AC
  Administered 2024-04-07: 20 meq via ORAL
  Filled 2024-04-07: qty 1

## 2024-04-07 MED ORDER — FENTANYL CITRATE (PF) 100 MCG/2ML IJ SOLN
INTRAMUSCULAR | Status: AC
  Filled 2024-04-07: qty 2

## 2024-04-07 MED ORDER — IRBESARTAN 150 MG PO TABS: 150.0000 mg | ORAL_TABLET | Freq: Every day | ORAL | Status: DC

## 2024-04-07 MED ORDER — ATROPINE SULFATE 1 MG/10ML IJ SOSY
PREFILLED_SYRINGE | INTRAMUSCULAR | Status: AC
  Filled 2024-04-07: qty 10

## 2024-04-07 MED ORDER — NITROGLYCERIN 0.4 MG SL SUBL: 0.4000 mg | SUBLINGUAL_TABLET | SUBLINGUAL | Status: DC | PRN

## 2024-04-07 MED ORDER — FREE WATER
500.0000 mL | Freq: Once | Status: AC
  Administered 2024-04-07: 500 mL via ORAL

## 2024-04-07 MED ORDER — SODIUM CHLORIDE 0.9 % IV SOLN
250.0000 mL | INTRAVENOUS | Status: AC | PRN
Start: 2024-04-07 — End: 2024-04-08

## 2024-04-07 MED ORDER — IOHEXOL 350 MG/ML SOLN
INTRAVENOUS | Status: DC | PRN
  Administered 2024-04-07: 75 mL

## 2024-04-07 MED ORDER — NITROGLYCERIN 1 MG/10 ML FOR IR/CATH LAB
INTRA_ARTERIAL | Status: AC
  Filled 2024-04-07: qty 10

## 2024-04-07 MED ORDER — POTASSIUM CHLORIDE CRYS ER 20 MEQ PO TBCR: 40.0000 meq | EXTENDED_RELEASE_TABLET | Freq: Once | ORAL | Status: DC

## 2024-04-07 MED ORDER — FENTANYL CITRATE (PF) 100 MCG/2ML IJ SOLN: 50.0000 ug | INTRAMUSCULAR | Status: DC | PRN

## 2024-04-07 MED ORDER — ASPIRIN 81 MG PO TBEC
81.0000 mg | DELAYED_RELEASE_TABLET | Freq: Every morning | ORAL | Status: DC
  Administered 2024-04-08 – 2024-04-12 (×5): 81 mg via ORAL
  Filled 2024-04-07 (×5): qty 1

## 2024-04-07 MED ORDER — SODIUM CHLORIDE 0.9% FLUSH: 3.0000 mL | INTRAVENOUS | Status: DC | PRN

## 2024-04-07 MED ORDER — MAGNESIUM OXIDE -MG SUPPLEMENT 400 (240 MG) MG PO TABS
400.0000 mg | ORAL_TABLET | Freq: Two times a day (BID) | ORAL | Status: DC
  Administered 2024-04-07 – 2024-04-12 (×10): 400 mg via ORAL
  Filled 2024-04-07 (×10): qty 1

## 2024-04-07 MED ORDER — POTASSIUM CHLORIDE CRYS ER 20 MEQ PO TBCR
40.0000 meq | EXTENDED_RELEASE_TABLET | Freq: Once | ORAL | Status: AC
  Administered 2024-04-07: 40 meq via ORAL
  Filled 2024-04-07: qty 2

## 2024-04-07 MED ORDER — MIDAZOLAM HCL 2 MG/2ML IJ SOLN
INTRAMUSCULAR | Status: DC | PRN
  Administered 2024-04-07: 2 mg via INTRAVENOUS

## 2024-04-07 MED ORDER — ACETAMINOPHEN 325 MG PO TABS
650.0000 mg | ORAL_TABLET | ORAL | Status: DC | PRN
Start: 2024-04-07 — End: 2024-04-12
  Administered 2024-04-08 – 2024-04-12 (×6): 650 mg via ORAL
  Filled 2024-04-07 (×7): qty 2

## 2024-04-07 MED ORDER — SODIUM CHLORIDE 0.9% FLUSH
3.0000 mL | Freq: Two times a day (BID) | INTRAVENOUS | Status: DC
  Administered 2024-04-07 – 2024-04-12 (×10): 3 mL via INTRAVENOUS

## 2024-04-07 MED ORDER — FENTANYL CITRATE PF 50 MCG/ML IJ SOSY
50.0000 ug | PREFILLED_SYRINGE | INTRAMUSCULAR | Status: DC | PRN
  Administered 2024-04-07 – 2024-04-12 (×10): 50 ug via INTRAVENOUS
  Filled 2024-04-07 (×10): qty 1

## 2024-04-07 MED ORDER — MIDAZOLAM HCL 2 MG/2ML IJ SOLN
INTRAMUSCULAR | Status: AC
  Filled 2024-04-07: qty 2

## 2024-04-07 MED ORDER — METOPROLOL TARTRATE 50 MG PO TABS
50.0000 mg | ORAL_TABLET | Freq: Two times a day (BID) | ORAL | Status: AC
  Administered 2024-04-07 – 2024-04-08 (×2): 50 mg via ORAL
  Filled 2024-04-07 (×2): qty 1

## 2024-04-07 MED ORDER — ONDANSETRON HCL 4 MG/2ML IJ SOLN
4.0000 mg | Freq: Four times a day (QID) | INTRAMUSCULAR | Status: DC | PRN
  Administered 2024-04-07: 4 mg via INTRAVENOUS
  Filled 2024-04-07: qty 2

## 2024-04-07 MED ORDER — ALBUTEROL SULFATE (2.5 MG/3ML) 0.083% IN NEBU: 2.5000 mg | INHALATION_SOLUTION | Freq: Four times a day (QID) | RESPIRATORY_TRACT | Status: DC | PRN

## 2024-04-07 NOTE — Progress Notes (Signed)
 Called to bedside for episode of unresponsiveness.  Patient had been having diarrhea and nausea since his heart catheterization this afternoon, and this evening had episode of emesis.  Per RN, he went unresponsive for about 45 seconds.  Regained consciousness and SBP in 150s when checked.  Telemetry reviewed, no arrhythmias, remains in sinus rhythm with PVCs.  Unclear cause of syncopal episode this evening.  Suspect vagal reaction, he reports having issues with this after prior heart catheterizations.  EKG from this evening with QTc 439.  Given Zofran  and reported improvement in his nausea.  Given his nausea/vomiting and hypokalemia on earlier labs (which is being repleted), will recheck BMET/magnesium  and replete for goal K>4, Mag>2.  Check stat echocardiogram given his VT arrest.  Will also check head CT given episode of unresponsiveness and persistent nausea.  Lonni LITTIE Nanas, MD

## 2024-04-07 NOTE — Progress Notes (Signed)
 Echocardiogram 2D Echocardiogram has been performed.  [name] 04/07/2024, 9:37 PM

## 2024-04-07 NOTE — ED Notes (Signed)
 50mcg fent given per Dr Darina Axe./

## 2024-04-07 NOTE — H&P (Addendum)
 Cardiology Admission History and Physical   Patient ID: [name] MRN: [id number]; DOB: November 13, 1955   Admission date: 04/07/2024  PCP:  Robert Dwayne NOVAK, FNP   [facility] Providers Cardiologist:  Evalene Lunger, MD       Chief Complaint:  collapse at home, chest pain  Patient Profile: [name] is a 68 y.o. male with CAD s/p CABG 2003 (LIMA to the LAD, vein graft to the diagonal, vein graft to the OM and vein graft to the distal RCA), HTN, HLD,former tobacco abuse, chronic leukocytosis, habitual ETOH intake, chronic SOB/COPD who is being seen 04/07/2024 for the evaluation of cardiac arrest.  History of Present Illness: [name] follows with Dr. Gollan with prior hx of CAD s/p CABG as above. Last nuc was in 2023 with moderate sized inferior wall defect, EF 44%, reported as low risk per MD. Last echo 09/2023 EF 55-60%, normal RV. He presented to [facility] via [facility]. He woke up in his USOH, has felt fine lately. He does report chronic intermittent palpitations without acute change lately. He was working outside doing Presenter, broadcasting and collapsed. He was reportedly initially gray and unresponsive. She started CPR immediately and EMS was called. Per notes, upon EMS arrival patient was in VT with a pulse, lidocaine  given which converted temporarily, 2nd dose given and drip started. Dr. Ladona reports he was also notified patient was groggy on EMS arrival. He arrived to [facility] awake and alert. Labs pending. The case was paged out as a code STEMI due to cardiac arrest. Had some chest soreness felt related to CPR. He went directly to the cath lab.   Past Medical History:  Diagnosis Date   CAD (coronary artery disease)    CHF (congestive heart failure) (HCC)    Chronic fatigue    Complication of anesthesia    BP drops.  Slow to wake.   COPD (chronic obstructive pulmonary disease) (HCC)    Dyspnea    with exertion   HLD (hyperlipidemia)    HTN (hypertension)    Leukocytosis     Myocardial infarction (HCC)    (x2)  last time 02/2002   Pneumonia    Hx of   Vertigo    1 episode, 5-6 yrs ago   Wears dentures    full upper   Past Surgical History:  Procedure Laterality Date   APPENDECTOMY     APPENDECTOMY     CATARACT EXTRACTION W/PHACO Right 03/20/2020   Procedure: CATARACT EXTRACTION PHACO AND INTRAOCULAR LENS PLACEMENT (IOC) RIGHT;  Surgeon: Jaye Fallow, MD;  Location: [facility];  Service: Ophthalmology;  Laterality: Right;  4.86 0:30.2   CATARACT EXTRACTION W/PHACO Left 04/10/2020   Procedure: CATARACT EXTRACTION PHACO AND INTRAOCULAR LENS PLACEMENT (IOC) LEFT 4.99 00:27.6;  Surgeon: Jaye Fallow, MD;  Location: [facility];  Service: Ophthalmology;  Laterality: Left;   COLONOSCOPY     CORONARY ARTERY BYPASS GRAFT  02/2002   4 vessel   HERNIA REPAIR     x 2   NECK SURGERY     cervical fusion     Medications Prior to Admission: Prior to Admission medications   Medication Sig Start Date End Date Taking? Authorizing Provider  albuterol  (VENTOLIN  HFA) 108 (90 Base) MCG/ACT inhaler Inhale 2 puffs into the lungs.    [provider]  amLODipine  (NORVASC ) 10 MG tablet TAKE 1 TABLET BY MOUTH EVERY DAY 01/08/24   Gollan, Timothy J, MD  aspirin  81 MG EC tablet Take 1 tablet (  81 mg total) by mouth every morning. 12/02/21   Gollan, Timothy J, MD  BREZTRI  AEROSPHERE 160-9-4.8 MCG/ACT AERO Inhale 2 puffs into the lungs 2 (two) times daily. 05/21/22   [provider]  budeson-glycopyrrolate -formoterol  (BREZTRI  AEROSPHERE) 160-9-4.8 MCG/ACT AERO inhaler Inhale 2 puffs into the lungs in the morning and at bedtime. 12/17/23   Tamea Dedra CROME, MD  budesonide -glycopyrrolate -formoterol  (BREZTRI  AEROSPHERE) 160-9-4.8 MCG/ACT AERO inhaler Inhale 2 puffs into the lungs in the morning and at bedtime. 02/25/24   Tamea Dedra CROME, MD  irbesartan  (AVAPRO ) 150 MG tablet TAKE 1 TABLET BY MOUTH EVERY DAY 01/12/24   Gollan, Timothy J, MD   nitroGLYCERIN  (NITROSTAT ) 0.4 MG SL tablet Place 1 tablet (0.4 mg total) under the tongue every 5 (five) minutes as needed for chest pain. 08/04/23   Gollan, Timothy J, MD  pravastatin  (PRAVACHOL ) 40 MG tablet TAKE 1 TABLET BY MOUTH EVERYDAY AT BEDTIME 03/31/24   Gollan, Timothy J, MD  tadalafil  (CIALIS ) 20 MG tablet Take 20 mg by mouth daily as needed. 09/26/21   [provider]     Allergies:    Allergies  Allergen Reactions   Heparin (Bovine) Anaphylaxis    Other reaction(s): Shock (ALLERGY)  ?Coma  Other reaction(s): Shock (ALLERGY) ?Coma ?Coma Other reaction(s): Shock (ALLERGY) ?Coma    ?Coma   Codeine Other (See Comments)    Breaks out into a sweat & rash  Reaction: Sweating (intolerance); Breaks out into a sweat & rash   Heparin Other (See Comments)    Respiratory failure  Other Reaction(s): respiratory failure   Penicillin G Other (See Comments)   Penicillins Other (See Comments) and Rash    Other reaction(s): Diaphoresis / Sweating (intolerance)  Has patient had a PCN reaction causing immediate rash, facial/tongue/throat swelling, SOB or lightheadedness with hypotension: Yes  Has patient had a PCN reaction causing severe rash involving mucus membranes or skin necrosis: No  Has patient had a PCN reaction that required hospitalization No  Has patient had a PCN reaction occurring within the last 10 years: No  If all of the above answers are NO, then may proceed with Cephalosporin use.  Other Reaction(s): sweating  Reaction: Diaphoresis / Sweating (intolerance)   Other reaction(s): Diaphoresis / Sweating (intolerance)  Has patient had a PCN reaction causing immediate rash, facial/tongue/throat swelling, SOB or lightheadedness with hypotension: Yes  Has patient had a PCN reaction causing severe rash involving mucus membranes or skin necrosis: No  Has patient had a PCN reaction that required hospitalization No  Has patient had a PCN reaction occurring within the  last 10 years: No  If all of the above answers are NO, then may proceed with Cephalosporin use.    Reaction: Diaphoresis / Sweating (intolerance)   Tape Itching and Rash   Wound Dressing Adhesive Itching and Rash    Social History:   Social History   Socioeconomic History   Marital status: Married    Spouse name: Not on file   Number of children: Not on file   Years of education: Not on file   Highest education level: Not on file  Occupational History   Not on file  Tobacco Use   Smoking status: Former    Current packs/day: 0.00    Average packs/day: 1 pack/day for 21.0 years (21.0 ttl pk-yrs)    Types: Cigarettes    Start date: 03/20/1981    Quit date: 03/20/2002    Years since quitting: 22.0   Smokeless tobacco: Never  Tobacco comments:    Tobacco use - no  Vaping Use   Vaping status: Never Used  Substance and Sexual Activity   Alcohol use: Yes    Alcohol/week: 2.0 standard drinks of alcohol    Types: 2 Cans of beer per week    Comment: daily (most days)   Drug use: No   Sexual activity: Not on file  Other Topics Concern   Not on file  Social History Narrative   Regularly exercises- works in the yard. Full time.    Social Drivers of Corporate investment banker Strain: Not on file  Food Insecurity: Not on file  Transportation Needs: Not on file  Physical Activity: Not on file  Stress: Not on file  Social Connections: Not on file  Intimate Partner Violence: Not on file     Family History:   The patient's family history includes Heart attack (age of onset: 53) in his father; Heart attack (age of onset: 13) in his mother.    ROS:  Please see the history of present illness.  All other ROS reviewed and negative.     Physical Exam/Data: Vitals:   04/07/24 1622 04/07/24 1627 04/07/24 1632 04/07/24 1637  BP: 116/71 116/71 123/69 122/67  Pulse: 85 85 85 82  Resp: 19 (!) 22 (!) 21 (!) 23  Temp:      TempSrc:      SpO2: 97% 95% 95% 95%   No intake or  output data in the 24 hours ending 04/07/24 1647    02/25/2024    9:59 AM 12/17/2023    8:57 AM 10/16/2023   10:09 AM  Last 3 Weights  Weight (lbs) 189 lb 190 lb 6.4 oz 182 lb 14.4 oz  Weight (kg) 85.73 kg 86.365 kg 82.963 kg     There is no height or weight on file to calculate BMI.  General: Well developed, well nourished, in no acute distress. Head: Normocephalic, atraumatic, sclera non-icteric, no xanthomas, nares are without discharge. Neck: Negative for carotid bruits. JVP not elevated. Lungs: Clear bilaterally to auscultation without wheezes, rales, or rhonchi. Breathing is unlabored. Heart: RRR S1 S2 without murmurs, rubs, or gallops.  Abdomen: Soft, non-tender, non-distended with normoactive bowel sounds. No rebound/guarding. Extremities: No clubbing or cyanosis. No edema. Distal pedal pulses are 2+ and equal bilaterally. Neuro: Alert and oriented X 3. Moves all extremities spontaneously. Psych:  Responds to questions appropriately with a normal affect.   EKG:  The ECG that was done today was personally reviewed and demonstrates NSR 83bpm, old anterior infarct, nonspecific STTW changes QTc  Relevant CV Studies:  2d echo 09/2023  1. Left ventricular ejection fraction, by estimation, is 55 to 60%. The left ventricle has normal function. The left ventricle has no regional wall motion abnormalities. Left ventricular diastolic parameters were  normal. The average left ventricular global longitudinal strain is -10.9 %. The global longitudinal strain is  abnormal.   2. Right ventricular systolic function is normal. The right ventricular size is normal. Tricuspid regurgitation signal is inadequate for assessing PA pressure.   3. The mitral valve is normal in structure. No evidence of mitral valve regurgitation. No evidence of mitral stenosis.   4. The aortic valve is normal in structure. Aortic valve regurgitation is not visualized. No aortic stenosis is present.   5. The inferior  vena cava is normal in size with greater than 50% respiratory variability, suggesting right atrial pressure of 3 mmHg.    Laboratory Data: Pending  Radiology/Studies:  Pending   Assessment and Plan:  1. Cardiac arrest/monomorphic VT 2. CAD s/p prior CABG, HLD 3. HTN 4. Habitual ETOH use (2 beers/day)  Case discussed with Dr. Ladona who performed cath. Per preliminary report, grafts OK. He reports he is placing admission orders and he will discuss further antiarrhythmic recs with electrophysiology team and enter additional orders if needed. He does not feel patient requires CIWA protocol at this time. Will order echocardiogram. Will also f/u labs. Addendum: WBC 21k - has chronic leukocytosis, likely exacerbated by arrest. Will follow-up value in AM. If still up consider further infectious w/u. K 3.4, will order KCl now then 20meq again in 3 hours. Initial troponin 163 c/w clinical picture. Dr. Ladona also aware.  Risk Assessment/Risk Scores:   TIMI Risk Score for Unstable Angina or Non-ST Elevation MI:  unable to calculate at this time with labs pending  Code Status: Full Code  Severity of Illness: The appropriate patient status for this patient is INPATIENT. Inpatient status is judged to be reasonable and necessary in order to provide the required intensity of service to ensure the patient's safety. The patient's presenting symptoms, physical exam findings, and initial radiographic and laboratory data in the context of their chronic comorbidities is felt to place them at high risk for further clinical deterioration. Furthermore, it is not anticipated that the patient will be medically stable for discharge from the hospital within 2 midnights of admission.   * I certify that at the point of admission it is my clinical judgment that the patient will require inpatient hospital care spanning beyond 2 midnights from the point of admission due to high intensity of service, high risk for  further deterioration and high frequency of surveillance required.*  For questions or updates, please contact [facility] Please consult [email or web address] for contact info under     Signed, Persais Ethridge N Shantai Tiedeman, PA-C  04/07/2024 4:47 PM

## 2024-04-07 NOTE — ED Provider Notes (Signed)
 [facility] CARDIOVASCULAR ICU Provider Note   CSN: [id number] Arrival date & time: 04/07/24  1535     Patient presents with: Post-Arrest   [name] is a 68 y.o. male.   HPI Patient presents via EMS after possible cardiac arrest. Patient is awake and alert, describing ongoing chest pain. Per EMS and the patient he was cutting a tree, standing on the ground, when he had chest pain, fell to the ground, per EMS wife provided bystander CPR.  On EMS arrival patient had evidence for ventricular tachycardia.  He received several boluses of lidocaine , subsequently had return of spontaneous circulation with sinus tachycardia.  Patient continues to complain of chest pain, denies any pain, focal weakness, EMS reports patient was tachycardic, tachypneic in transport, but interactive. ECG transmitted prior to arrival, reviewed at bedside with our cardiology colleagues, and management conducted with her cardiology colleagues.     Prior to Admission medications   Medication Sig Start Date End Date Taking? Authorizing Provider  albuterol  (VENTOLIN  HFA) 108 (90 Base) MCG/ACT inhaler Inhale 2 puffs into the lungs.    [provider]  amLODipine  (NORVASC ) 10 MG tablet TAKE 1 TABLET BY MOUTH EVERY DAY 01/08/24   Gollan, Timothy J, MD  aspirin  81 MG EC tablet Take 1 tablet (81 mg total) by mouth every morning. 12/02/21   Gollan, Timothy J, MD  BREZTRI  AEROSPHERE 160-9-4.8 MCG/ACT AERO Inhale 2 puffs into the lungs 2 (two) times daily. 05/21/22   [provider]  budeson-glycopyrrolate -formoterol  (BREZTRI  AEROSPHERE) 160-9-4.8 MCG/ACT AERO inhaler Inhale 2 puffs into the lungs in the morning and at bedtime. 12/17/23   Tamea Dedra CROME, MD  budesonide -glycopyrrolate -formoterol  (BREZTRI  AEROSPHERE) 160-9-4.8 MCG/ACT AERO inhaler Inhale 2 puffs into the lungs in the morning and at bedtime. 02/25/24   Tamea Dedra CROME, MD  irbesartan  (AVAPRO ) 150 MG tablet TAKE 1 TABLET BY MOUTH EVERY DAY  01/12/24   Gollan, Timothy J, MD  nitroGLYCERIN  (NITROSTAT ) 0.4 MG SL tablet Place 1 tablet (0.4 mg total) under the tongue every 5 (five) minutes as needed for chest pain. 08/04/23   Gollan, Timothy J, MD  pravastatin  (PRAVACHOL ) 40 MG tablet TAKE 1 TABLET BY MOUTH EVERYDAY AT BEDTIME 03/31/24   Gollan, Timothy J, MD  tadalafil  (CIALIS ) 20 MG tablet Take 20 mg by mouth daily as needed. 09/26/21   [provider]    Allergies: Heparin (bovine), Codeine, Heparin, Penicillin g, Penicillins, Tape, and Wound dressing adhesive    Review of Systems  Updated Vital Signs BP (!) 150/80 (BP Location: Left Arm)   Pulse 82   Temp 98 F (36.7 C) (Temporal)   Resp 14   SpO2 97%   Physical Exam Vitals and nursing note reviewed.  Constitutional:      General: He is not in acute distress.    Appearance: He is well-developed. He is diaphoretic.  HENT:     Head: Normocephalic and atraumatic.  Eyes:     Conjunctiva/sclera: Conjunctivae normal.  Cardiovascular:     Rate and Rhythm: Regular rhythm. Tachycardia present.  Pulmonary:     Effort: Pulmonary effort is normal. No respiratory distress.     Breath sounds: No stridor.  Abdominal:     General: There is no distension.  Skin:    General: Skin is warm.  Neurological:     Mental Status: He is alert and oriented to person, place, and time.     (all labs ordered are listed, but only abnormal results are displayed) Labs  Reviewed  CBC WITH DIFFERENTIAL/PLATELET - Abnormal; Notable for the following components:      Result Value   WBC 21.2 (*)    Neutro Abs 18.0 (*)    Monocytes Absolute 1.3 (*)    Abs Immature Granulocytes 0.22 (*)    All other components within normal limits  COMPREHENSIVE METABOLIC PANEL WITH GFR - Abnormal; Notable for the following components:   Potassium 3.4 (*)    CO2 19 (*)    Glucose, Bld 145 (*)    Calcium  8.8 (*)    Total Protein 6.4 (*)    AST 66 (*)    ALT 57 (*)    All other components within normal  limits  POCT I-STAT, CHEM 8 - Abnormal; Notable for the following components:   Potassium 3.4 (*)    Glucose, Bld 144 (*)    TCO2 19 (*)    All other components within normal limits  TROPONIN I (HIGH SENSITIVITY) - Abnormal; Notable for the following components:   Troponin I (High Sensitivity) 163 (*)    All other components within normal limits  MRSA NEXT GEN BY PCR, NASAL  HEMOGLOBIN A1C  PROTIME-INR  APTT  LIPID PANEL  LACTIC ACID, PLASMA  MAGNESIUM   LIPOPROTEIN A (LPA)  CG4 I-STAT (LACTIC ACID)  TROPONIN I (HIGH SENSITIVITY)    ECG from the field, rhythm strips consistent with findings as below, ST elevation lead I, multiple depressions in multiple other leads concerning for ischemia.   EKG: EKG Interpretation Date/Time:  Thursday April 07 2024 15:40:26 EDT Ventricular Rate:  83 PR Interval:  195 QRS Duration:  105 QT Interval:  373 QTC Calculation: 439 R Axis:   40  Text Interpretation: Sinus rhythm ST-t wave abnormality Abnormal ECG Confirmed by Garrick Charleston [phone]) on 04/07/2024 5:39:52 PM      Procedures   Medications Ordered in the ED  nitroGLYCERIN  100 mcg/mL intra-arterial injection (has no administration in time range)  aspirin  EC tablet 81 mg (has no administration in time range)  nitroGLYCERIN  (NITROSTAT ) SL tablet 0.4 mg (has no administration in time range)  albuterol  (PROVENTIL ) (2.5 MG/3ML) 0.083% nebulizer solution 2.5 mg (has no administration in time range)  irbesartan  (AVAPRO ) tablet 150 mg (has no administration in time range)  pravastatin  (PRAVACHOL ) tablet 40 mg (has no administration in time range)  acetaminophen  (TYLENOL ) tablet 650 mg (has no administration in time range)  ondansetron  (ZOFRAN ) injection 4 mg (has no administration in time range)  free water  500 mL (has no administration in time range)  sodium chloride  flush (NS) 0.9 % injection 3 mL (has no administration in time range)  sodium chloride  flush (NS) 0.9 % injection 3  mL (has no administration in time range)  0.9 %  sodium chloride  infusion (has no administration in time range)  metoprolol  tartrate (LOPRESSOR ) tablet 50 mg (has no administration in time range)  magnesium  oxide (MAG-OX) tablet 400 mg (has no administration in time range)  fentaNYL  (SUBLIMAZE ) injection 50 mcg (50 mcg Intravenous Given 04/07/24 1729)  potassium chloride  SA (KLOR-CON  M) CR tablet 40 mEq (has no administration in time range)                                    Medical Decision Making Amount and/or Complexity of Data Reviewed Independent Historian: EMS Labs: ordered. ECG/medicine tests: ordered and independent interpretation performed. Decision-making details documented in ED Course.  Risk Decision regarding  hospitalization.  After arrival patient placed on our monitoring device, with concern for ACS given his chest pain, V-fib, abnormal ECG with elevations in lead I, multiple ST depressions in multiple leads, case discussed with our cardiologist at bedside. Patient has heparin allergy, will require alternative anticoagulation measures, this was discussed with the cath team as well. Patient received fentanyl  in the ED, continuous monitoring, with concern for ACS was transported emergently to the catheterization lab. On review, catheterization results included below:  Impression and recommendations: No change in coronary anatomy from 2014.  Widely patent grafts.  No change in his LVEF with inferior wall scar.  Suggest he needs ICD implantation for secondary prevention of sudden cardiac death.  Final diagnoses:  Cardiac arrest [facility])    CRITICAL CARE Performed by: Lamar Salen Total critical care time: 35 minutes Critical care time was exclusive of separately billable procedures and treating other patients. Critical care was necessary to treat or prevent imminent or life-threatening deterioration. Critical care was time spent personally by me on the following  activities: development of treatment plan with patient and/or surrogate as well as nursing, discussions with consultants, evaluation of patient's response to treatment, examination of patient, obtaining history from patient or surrogate, ordering and performing treatments and interventions, ordering and review of laboratory studies, ordering and review of radiographic studies, pulse oximetry and re-evaluation of patient's condition.    Salen Lamar, MD 04/07/24 [phone]

## 2024-04-07 NOTE — Progress Notes (Addendum)
 See Dr. Alvan note regarding episode of decreased consciousness suspected vasovagal. Signed out to on call fellow re: CT head, 2d echo, lytes pending (has 930 pm dose of Kcl pending, dose change depending on labs). Hold irbesartan  in AM pending BP trends.

## 2024-04-07 NOTE — ED Triage Notes (Signed)
 Patient BIB [facility] from home after collapsing while working outside. Patient's wife reports initially gray and unresponsive, she started CPR. On EMS arrival patient in v-tach with a pulse, lidocaine  given, which converted temporarily, 2nd dose given and drip started to maintain sinus rhythm. STEMI called with EKG transmission, patient transferred to cath lab shortly after arrival.

## 2024-04-08 ENCOUNTER — Telehealth (HOSPITAL_COMMUNITY): Payer: Self-pay | Admitting: Pharmacy Technician

## 2024-04-08 ENCOUNTER — Inpatient Hospital Stay (HOSPITAL_COMMUNITY)

## 2024-04-08 ENCOUNTER — Encounter (HOSPITAL_COMMUNITY): Payer: Self-pay | Admitting: Cardiology

## 2024-04-08 ENCOUNTER — Other Ambulatory Visit (HOSPITAL_COMMUNITY): Payer: Self-pay

## 2024-04-08 DIAGNOSIS — I2510 Atherosclerotic heart disease of native coronary artery without angina pectoris: Secondary | ICD-10-CM

## 2024-04-08 DIAGNOSIS — I4901 Ventricular fibrillation: Secondary | ICD-10-CM

## 2024-04-08 DIAGNOSIS — I469 Cardiac arrest, cause unspecified: Secondary | ICD-10-CM | POA: Diagnosis not present

## 2024-04-08 LAB — COMPREHENSIVE METABOLIC PANEL WITH GFR
ALT: 51 U/L — ABNORMAL HIGH (ref 0–44)
AST: 48 U/L — ABNORMAL HIGH (ref 15–41)
Albumin: 3.4 g/dL — ABNORMAL LOW (ref 3.5–5.0)
Alkaline Phosphatase: 102 U/L (ref 38–126)
Anion gap: 10 (ref 5–15)
BUN: 11 mg/dL (ref 8–23)
CO2: 21 mmol/L — ABNORMAL LOW (ref 22–32)
Calcium: 8.5 mg/dL — ABNORMAL LOW (ref 8.9–10.3)
Chloride: 106 mmol/L (ref 98–111)
Creatinine, Ser: 0.75 mg/dL (ref 0.61–1.24)
GFR, Estimated: >60 mL/min (ref >60)
Glucose, Bld: 137 mg/dL — ABNORMAL HIGH (ref 70–99)
Potassium: 4.3 mmol/L (ref 3.5–5.1)
Sodium: 137 mmol/L (ref 135–145)
Total Bilirubin: 0.9 mg/dL (ref 0.0–1.2)
Total Protein: 6 g/dL — ABNORMAL LOW (ref 6.5–8.1)

## 2024-04-08 LAB — CBC
HCT: 39.9 % (ref 39.0–52.0)
Hemoglobin: 13.6 g/dL (ref 13.0–17.0)
MCH: 30.9 pg (ref 26.0–34.0)
MCHC: 34.1 g/dL (ref 30.0–36.0)
MCV: 90.7 fL (ref 80.0–100.0)
Platelets: 228 K/uL (ref 150–400)
RBC: 4.4 MIL/uL (ref 4.22–5.81)
RDW: 12.4 % (ref 11.5–15.5)
WBC: 17.4 K/uL — ABNORMAL HIGH (ref 4.0–10.5)
nRBC: 0 % (ref 0.0–0.2)

## 2024-04-08 LAB — SARS CORONAVIRUS 2 BY RT PCR: SARS Coronavirus 2 by RT PCR: NEGATIVE

## 2024-04-08 LAB — MAGNESIUM: Magnesium: 1.9 mg/dL (ref 1.7–2.4)

## 2024-04-08 MED ORDER — OXYCODONE HCL 5 MG PO TABS
5.0000 mg | ORAL_TABLET | Freq: Two times a day (BID) | ORAL | Status: DC | PRN
  Administered 2024-04-08 – 2024-04-12 (×6): 5 mg via ORAL
  Filled 2024-04-08 (×6): qty 1

## 2024-04-08 MED ORDER — OXYCODONE HCL 5 MG PO TABS: 5.0000 mg | ORAL_TABLET | Freq: Two times a day (BID) | ORAL | Status: DC

## 2024-04-08 MED ORDER — IRBESARTAN 150 MG PO TABS
75.0000 mg | ORAL_TABLET | Freq: Every day | ORAL | Status: DC
  Administered 2024-04-08 – 2024-04-12 (×5): 75 mg via ORAL
  Filled 2024-04-08 (×5): qty 1

## 2024-04-08 MED ORDER — METOPROLOL SUCCINATE ER 50 MG PO TB24
50.0000 mg | ORAL_TABLET | Freq: Every day | ORAL | Status: DC
  Administered 2024-04-10 – 2024-04-12 (×3): 50 mg via ORAL
  Filled 2024-04-08 (×4): qty 1

## 2024-04-08 MED ORDER — GADOBUTROL 1 MMOL/ML IV SOLN
10.0000 mL | Freq: Once | INTRAVENOUS | Status: AC | PRN
  Administered 2024-04-08: 10 mL via INTRAVENOUS

## 2024-04-08 MED ORDER — ORAL CARE MOUTH RINSE
15.0000 mL | OROMUCOSAL | Status: DC | PRN
Start: 2024-04-08 — End: 2024-04-12

## 2024-04-08 MED ORDER — ROSUVASTATIN CALCIUM 20 MG PO TABS
20.0000 mg | ORAL_TABLET | Freq: Every day | ORAL | Status: DC
  Administered 2024-04-09 – 2024-04-12 (×4): 20 mg via ORAL
  Filled 2024-04-08 (×4): qty 1

## 2024-04-08 MED ORDER — MAGNESIUM SULFATE 2 GM/50ML IV SOLN
2.0000 g | Freq: Once | INTRAVENOUS | Status: AC
  Administered 2024-04-08: 2 g via INTRAVENOUS
  Filled 2024-04-08: qty 50

## 2024-04-08 MED ORDER — CHLORHEXIDINE GLUCONATE CLOTH 2 % EX PADS
6.0000 | MEDICATED_PAD | Freq: Every day | CUTANEOUS | Status: DC
  Administered 2024-04-08 – 2024-04-11 (×3): 6 via TOPICAL

## 2024-04-08 MED FILL — Nitroglycerin IV Soln 100 MCG/ML in D5W: INTRA_ARTERIAL | Qty: 10 | Status: AC

## 2024-04-08 NOTE — Consult Note (Signed)
 ELECTROPHYSIOLOGY CONSULT NOTE    Patient ID: [name] MRN: [id number], DOB/AGE: 1956-07-06 68 y.o.  Admit date: 04/07/2024 Date of Consult: 04/08/2024  Primary Physician: Orlando Dwayne NOVAK, FNP Primary Cardiologist: Evalene Lunger, MD  Electrophysiologist: Dr. Cindie   Referring Provider: @ATTENDING @  Patient Profile: [name] is a 68 y.o. male with a history of  CAD s/p CABG 2003 (LIMA to the LAD, vein graft to the diagonal, vein graft to the OM and vein graft to the distal RCA), HTN, HLD,former tobacco abuse, chronic leukocytosis, habitual ETOH intake, chronic SOB/COPD who is being seen today for the evaluation of VT/ICD consideration.  HPI:  [name] is a 68 y.o. male with above medical history who presented to the ED with EMS on 8/14 after cardiac arrest at home. Patient was in his usual state of health yesterday and was out working in his yard, cutting down trees. In the afternoon, patient's wife states she saw him put down the chainsaw and start walking towards his tractor when he suddenly collapsed and was unresponsive and gray. Patient's spouse immediately began CPR and called EMS. Upon EMS arrival, patient in VT with pulse. He received Lidocaine  with temporary conversion to NSR, was eventually transitioned to a drip. Due to suspected VT arrest, STEMI activation paged and patient was taken straight to the cath lab upon arrival to the hospital. Patient reported some chest soreness following CPR but was otherwise without significant symptoms. On LHC, patient with patent grafts. Initial labs noted leukocytosis with WBC 21,000. Troponin 163->677.  Post LHC, patient noted to have diarrhea and nausea. Per nursing staff, patient had approximately 45 seconds of unresponsiveness with no loss of pulses. BP stable upon regaining consciousness. Per provider note from 8/14, vasovagal episode suspected with patient reporting similar occurrence after prior LHC.   This morning,  patient reports ongoing chest wall soreness from CPR. Generally feels poorly but is not having focal symptoms. In further discussing events from yesterday, patient's spouse notes that several times in the last couple of years, patient has had episodes of acute diaphoresis with severe fatigue and near syncope, similar to yesterday though less severe.    Labs Potassium4.3 (08/15 0230) Magnesium   1.9 (08/15 0230) Creatinine, ser  0.75 (08/15 0230) PLT  228 (08/15 0230) HGB  13.6 (08/15 0230) WBC 17.4* (08/15 0230) Troponin I (High Sensitivity)677* (08/14 1737).    Past Medical History:  Diagnosis Date   CAD (coronary artery disease)    CHF (congestive heart failure) (HCC)    Chronic fatigue    Complication of anesthesia    BP drops.  Slow to wake.   COPD (chronic obstructive pulmonary disease) (HCC)    Dyspnea    with exertion   HLD (hyperlipidemia)    HTN (hypertension)    Leukocytosis    Myocardial infarction (HCC)    (x2)  last time 02/2002   Pneumonia    Hx of   Vertigo    1 episode, 5-6 yrs ago   Wears dentures    full upper     Surgical History:  Past Surgical History:  Procedure Laterality Date   APPENDECTOMY     APPENDECTOMY     CATARACT EXTRACTION W/PHACO Right 03/20/2020   Procedure: CATARACT EXTRACTION PHACO AND INTRAOCULAR LENS PLACEMENT (IOC) RIGHT;  Surgeon: Jaye Fallow, MD;  Location: [facility];  Service: Ophthalmology;  Laterality: Right;  4.86 0:30.2   CATARACT EXTRACTION W/PHACO Left 04/10/2020   Procedure: CATARACT EXTRACTION PHACO AND  INTRAOCULAR LENS PLACEMENT (IOC) LEFT 4.99 00:27.6;  Surgeon: Jaye Fallow, MD;  Location: [facility];  Service: Ophthalmology;  Laterality: Left;   COLONOSCOPY     CORONARY ARTERY BYPASS GRAFT  02/2002   4 vessel   HERNIA REPAIR     x 2   LEFT HEART CATH AND CORS/GRAFTS ANGIOGRAPHY N/A 04/07/2024   Procedure: LEFT HEART CATH AND CORS/GRAFTS ANGIOGRAPHY;  Surgeon: Ladona Heinz, MD;  Location: [facility]  INVASIVE CV LAB;  Service: Cardiovascular;  Laterality: N/A;   NECK SURGERY     cervical fusion     Medications Prior to Admission  Medication Sig Dispense Refill Last Dose/Taking   albuterol  (VENTOLIN  HFA) 108 (90 Base) MCG/ACT inhaler Inhale 2 puffs into the lungs.   Unknown   amLODipine  (NORVASC ) 10 MG tablet TAKE 1 TABLET BY MOUTH EVERY DAY 90 tablet 3 04/07/2024   aspirin  81 MG EC tablet Take 1 tablet (81 mg total) by mouth every morning. 90 tablet 3 04/07/2024   budesonide -glycopyrrolate -formoterol  (BREZTRI  AEROSPHERE) 160-9-4.8 MCG/ACT AERO inhaler Inhale 2 puffs into the lungs in the morning and at bedtime. 2 each  04/07/2024 Morning   irbesartan  (AVAPRO ) 150 MG tablet TAKE 1 TABLET BY MOUTH EVERY DAY 90 tablet 1 04/07/2024   nitroGLYCERIN  (NITROSTAT ) 0.4 MG SL tablet Place 1 tablet (0.4 mg total) under the tongue every 5 (five) minutes as needed for chest pain. 25 tablet 1 Unknown   pravastatin  (PRAVACHOL ) 40 MG tablet TAKE 1 TABLET BY MOUTH EVERYDAY AT BEDTIME 90 tablet 1 04/06/2024   tadalafil  (CIALIS ) 20 MG tablet Take 20 mg by mouth daily as needed.   Unknown    Inpatient Medications:   aspirin  EC  81 mg Oral q morning   Chlorhexidine  Gluconate Cloth  6 each Topical Daily   magnesium  oxide  400 mg Oral BID   metoprolol  tartrate  50 mg Oral BID   pravastatin   40 mg Oral q1800   sodium chloride  flush  3 mL Intravenous Q12H    Allergies:  Allergies  Allergen Reactions   Heparin (Bovine) Anaphylaxis    Other reaction(s): Shock (ALLERGY)  ?Coma  Other reaction(s): Shock (ALLERGY) ?Coma ?Coma Other reaction(s): Shock (ALLERGY) ?Coma    ?Coma   Codeine Other (See Comments)    Breaks out into a sweat & rash  Reaction: Sweating (intolerance); Breaks out into a sweat & rash   Heparin Other (See Comments)    Respiratory failure  Other Reaction(s): respiratory failure   Penicillin G Other (See Comments)   Penicillins Other (See Comments) and Rash    Other reaction(s):  Diaphoresis / Sweating (intolerance)  Has patient had a PCN reaction causing immediate rash, facial/tongue/throat swelling, SOB or lightheadedness with hypotension: Yes  Has patient had a PCN reaction causing severe rash involving mucus membranes or skin necrosis: No  Has patient had a PCN reaction that required hospitalization No  Has patient had a PCN reaction occurring within the last 10 years: No  If all of the above answers are NO, then may proceed with Cephalosporin use.  Other Reaction(s): sweating  Reaction: Diaphoresis / Sweating (intolerance)   Other reaction(s): Diaphoresis / Sweating (intolerance)  Has patient had a PCN reaction causing immediate rash, facial/tongue/throat swelling, SOB or lightheadedness with hypotension: Yes  Has patient had a PCN reaction causing severe rash involving mucus membranes or skin necrosis: No  Has patient had a PCN reaction that required hospitalization No  Has patient had a PCN reaction occurring within the last 10  years: No  If all of the above answers are NO, then may proceed with Cephalosporin use.    Reaction: Diaphoresis / Sweating (intolerance)   Tape Itching and Rash   Wound Dressing Adhesive Itching and Rash    Family History  Problem Relation Age of Onset   Heart attack Mother 73   Heart attack Father 38     Physical Exam: Vitals:   04/08/24 1030 04/08/24 1045 04/08/24 1100 04/08/24 1115  BP: (!) 146/78 129/66 132/71 (!) 142/70  Pulse: (!) 56 (!) 56 (!) 51 (!) 55  Resp: 17 (!) 23 18 (!) 21  Temp:    98.8 F (37.1 C)  TempSrc:    Oral  SpO2: 95% 93% 94% 94%  Weight:        GEN- Pleasant patient, in NAD but tired appear, A&O x 3, normal affect.  HEENT: Normocephalic, atraumatic Lungs- CTAB, Normal effort.  Heart- Regular rate and rhythm, No M/G/R. Chest wall tenderness GI- Soft, NT, ND.  Extremities- No clubbing, cyanosis, or edema   Radiology/Studies: CT HEAD WO CONTRAST ( ) Result Date: 04/07/2024 CLINICAL  DATA:  Initial evaluation for acute syncope/presyncope. EXAM: CT HEAD WITHOUT CONTRAST TECHNIQUE: Contiguous axial images were obtained from the base of the skull through the vertex without intravenous contrast. RADIATION DOSE REDUCTION: This exam was performed according to the departmental dose-optimization program which includes automated exposure control, adjustment of the mA and/or kV according to patient size and/or use of iterative reconstruction technique. COMPARISON:  Prior study from 03/25/2018. FINDINGS: Brain: Examination mildly limited by somewhat limited as the posterior fossa is partially obscured by artifact. Cerebral volume within normal limits. No acute intracranial hemorrhage. No acute large vessel territory infarct. No mass lesion, midline shift or mass effect. No hydrocephalus or extra-axial fluid collection. Vascular: No abnormal hyperdense vessel. Skull: Scalp soft tissues within normal limits.  Calvarium intact. Sinuses/Orbits: Globes orbital soft tissues within normal limits. Moderate mucosal thickening present about the ethmoidal air cells and maxillary sinuses. Superimposed air-fluid level with pneumatized secretions within the maxillary sinuses. No mastoid effusion. Other: Sclerotic lesion involving the right pterygoid plate noted, suspected to reflect a benign bone island, stable. IMPRESSION: 1. No acute intracranial abnormality. 2. Moderate ethmoidal and maxillary sinusitis. Electronically Signed   By: Morene Hoard [name]   On: 04/07/2024 22:37   ECHOCARDIOGRAM COMPLETE Result Date: 04/07/2024    ECHOCARDIOGRAM REPORT   Patient Name:   [name] Date of Exam: 04/07/2024 Medical Rec #:  [id number]     Height:       64.0 in Accession #:    [id number]    Weight:       188.3 lb Date of Birth:  [birth year]     BSA:          1.907 m Patient Age:    67 years      BP:           137/73 mmHg Patient Gender: M             HR:           80 bpm. Exam Location:  Inpatient Procedure: 2D Echo,  Cardiac Doppler, Color Doppler and Intracardiac            Opacification Agent (Both Spectral and Color Flow Doppler were            utilized during procedure). Indications:     Syncope R55, Ventricular Tachycardia I47.2  History:  Patient has prior history of Echocardiogram examinations, most                  recent 10/06/2023. CAD, Prior CABG, COPD, Signs/Symptoms:Chest                  Pain and Shortness of Breath; Risk Factors:Hypertension and                  Dyslipidemia.  Sonographer:     Thea Norlander RCS Referring Phys:  [id number] RAPHAEL SAILOR DUNN Diagnosing Phys: Lonni Nanas MD IMPRESSIONS  1. Left ventricular ejection fraction, by estimation, is 40 to 45%. The left ventricle has mildly decreased function. The left ventricle demonstrates regional wall motion abnormalities (see scoring diagram/findings for description). Left ventricular diastolic parameters are indeterminate.  2. Right ventricular systolic function is normal. The right ventricular size is normal. Tricuspid regurgitation signal is inadequate for assessing PA pressure.  3. The mitral valve is normal in structure. Trivial mitral valve regurgitation. No evidence of mitral stenosis.  4. The aortic valve is tricuspid. Aortic valve regurgitation is not visualized. No aortic stenosis is present.  5. The inferior vena cava is normal in size with greater than 50% respiratory variability, suggesting right atrial pressure of 3 mmHg. FINDINGS  Left Ventricle: Left ventricular ejection fraction, by estimation, is 40 to 45%. The left ventricle has mildly decreased function. The left ventricle demonstrates regional wall motion abnormalities. Definity  contrast agent was given IV to delineate the left ventricular endocardial borders. The left ventricular internal cavity size was normal in size. There is no left ventricular hypertrophy. Left ventricular diastolic parameters are indeterminate.  LV Wall Scoring: The inferior wall and posterior wall are  akinetic. The entire anterior wall, antero-lateral wall, entire septum, and entire apex are normal. Right Ventricle: The right ventricular size is normal. No increase in right ventricular wall thickness. Right ventricular systolic function is normal. Tricuspid regurgitation signal is inadequate for assessing PA pressure. Left Atrium: Left atrial size was normal in size. Right Atrium: Right atrial size was normal in size. Pericardium: There is no evidence of pericardial effusion. Mitral Valve: The mitral valve is normal in structure. Trivial mitral valve regurgitation. No evidence of mitral valve stenosis. Tricuspid Valve: The tricuspid valve is normal in structure. Tricuspid valve regurgitation is trivial. Aortic Valve: The aortic valve is tricuspid. Aortic valve regurgitation is not visualized. No aortic stenosis is present. Aortic valve peak gradient measures 6.9 mmHg. Pulmonic Valve: The pulmonic valve was not well visualized. Pulmonic valve regurgitation is trivial. Aorta: The aortic root and ascending aorta are structurally normal, with no evidence of dilitation. Venous: The inferior vena cava is normal in size with greater than 50% respiratory variability, suggesting right atrial pressure of 3 mmHg. IAS/Shunts: The interatrial septum was not well visualized.  LEFT VENTRICLE PLAX 2D LVIDd:         5.70 cm   Diastology LVIDs:         4.40 cm   LV e' medial:    6.85 cm/s LV PW:         1.10 cm   LV E/e' medial:  15.9 LV IVS:        0.80 cm   LV e' lateral:   10.70 cm/s LVOT diam:     2.10 cm   LV E/e' lateral: 10.2 LV SV:         59 LV SV Index:   31 LVOT Area:     3.46 cm  RIGHT  VENTRICLE             IVC RV S prime:     12.30 cm/s  IVC diam: 1.70 cm TAPSE (M-mode): 1.5 cm LEFT ATRIUM             Index        RIGHT ATRIUM           Index LA diam:        4.60 cm 2.41 cm/m   RA Area:     11.70 cm LA Vol (A2C):   63.2 ml 33.15 ml/m  RA Volume:   24.50 ml  12.85 ml/m LA Vol (A4C):   36.7 ml 19.25 ml/m LA  Biplane Vol: 48.5 ml 25.44 ml/m  AORTIC VALVE AV Area (Vmax): 2.41 cm AV Vmax:        131.00 cm/s AV Peak Grad:   6.9 mmHg LVOT Vmax:      91.20 cm/s LVOT Vmean:     58.200 cm/s LVOT VTI:       0.169 m  AORTA Ao Root diam: 3.00 cm Ao Asc diam:  3.00 cm MITRAL VALVE MV Area (PHT): 4.80 cm     SHUNTS MV Decel Time: 158 msec     Systemic VTI:  0.17 m MV E velocity: 109.00 cm/s  Systemic Diam: 2.10 cm MV A velocity: 75.50 cm/s MV E/A ratio:  1.44 Lonni Nanas MD Electronically signed by Lonni Nanas MD Signature Date/Time: 04/07/2024/9:51:43 PM    Final    CARDIAC CATHETERIZATION Result Date: 04/07/2024 Images from the original result were not included. Cardiac Catheterization 04/07/24: Hemodynamic data: LV 99/-6, EDP 4 mmHg.  Ao 99/52, mean 70 mmHg.  No pressure gradient across aortic valve. Angiographic data: LV: Inferior akinesis.  LVEF 40%.  No significant MR. LM: Small diffusely diseased vessel. LAD: Occluded in the proximal segment.  LAD is supplied by saphenous vein graft to D1. LCx: Small to moderate caliber vessel giving origin to large OM1 and OM 2.  OM1 is occluded in the ostium and OM 2 has a proximal 90% stenosis.  There is skip graft of SVG to OM1 and OM 2. RCA: It is large-caliber vessel, occluded in the proximal segment after the origin of RV branch.  Could not be cannulated well, reviewed his angiograms from 2014. LIMA to LAD was known atretic. SVG to to D1: Widely patent, normal, retrogradely fills the entire LAD. SVG to OM1 and OM 2: Widely patent. SVG to PDA: Widely patent. Impression and recommendations: No change in coronary anatomy from 2014.  Widely patent grafts.  No change in his LVEF with inferior wall scar.  Suggest he needs ICD implantation for secondary prevention of sudden cardiac death.    ZXH:dpwld rhythm with intraventricular conduction delay. Nonspecific low lateral TWI (personally reviewed)  TELEMETRY: sinus rhythm/sinus bradycardia with rates 40s-60s,  isolated PVCs. (personally reviewed)  DEVICE HISTORY: n/a  Assessment/Plan:  S/P cardiac arrest Ventricular tachycardia S/P CABG (2003)  Patient with cardiac arrest at home with VT on 8/14. Received immediate bystander CPR and had regained pulses (still VT) upon EMS arrival. Received IV lidocaine  push followed by infusion, in sinus rhythm upon arrival to the hospital. Patient taken straight to the cath lab, found with patent grafts, no change from 2014 LHC. LHC suggestive of inferior wall scar. TTE with LVEF 40-45%, inferior wall and posterior wall akinesis (LVEF 55-60% 10/06/23).  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 Cindie, will obtain cMRI prior to  ICD implant in order to more clearly define scar location/pattern. K initially 3.4, corrected with labs this AM showing K 4.3, Mg 1.9. Oral Mg replacement has been ordered to maintain Mg >2. K goal >4.     Bradycardia  Transient bradycardia this admission. Review of chart reveals historical bradycardia with Metoprolol  intermittently held as a result.  Metoprolol  held this morning with HR in the 40s-low 50s.   CAD s/p CABG LHC with stable grafts. Continue home statin  Hypertension Generally stable BP this admission with home  Home amlodipine  held.       For questions or updates, please contact [facility] Please consult [email or web address] for contact info under     Signed, Artist Pouch, PA-C  04/08/2024, 1:08 PM

## 2024-04-08 NOTE — Progress Notes (Addendum)
 Advanced Heart Failure Rounding Note  Cardiologist: Evalene Lunger, MD  Chief Complaint: Post VT Arrest   Patient Profile   68 y.o. male with CAD s/p CABG 2003 (LIMA to the LAD, vein graft to the diagonal, vein graft to the OM and vein graft to the distal RCA), HTN, HLD,former tobacco abuse, chronic leukocytosis, habitual ETOH intake, chronic SOB/COPD admitted post wittnessed OOH VT arrest treated w/ immediate bystander CPR.   Quick ROSC. Neurologically intact  LHC no culprit lesions (stable dz per below) Echo EF 40-45%, RV normal  Initial K 3.4, Mg 1.9 VT presumed to be scar mediated   Subjective:    A&O x 3. Sitting up on side of bed. Just returned from getting cMRI Chest sore from chest compressions. No other complaints.   Rhythm stable on tele. No further VT  Cardiac Studies:    Cardiac Catheterization 04/07/24: Hemodynamic data: LV 99/-6, EDP 4 mmHg.  Ao 99/52, mean 70 mmHg.  No pressure gradient across aortic valve.   Angiographic data: LV: Inferior akinesis.  LVEF 40%.  No significant MR. LM: Small diffusely diseased vessel. LAD: Occluded in the proximal segment.  LAD is supplied by saphenous vein graft to D1. LCx: Small to moderate caliber vessel giving origin to large OM1 and OM 2.  OM1 is occluded in the ostium and OM 2 has a proximal 90% stenosis.  There is skip graft of SVG to OM1 and OM 2. RCA: It is large-caliber vessel, occluded in the proximal segment after the origin of RV branch.  Could not be cannulated well, reviewed his angiograms from 2014.   LIMA to LAD was known atretic. SVG to to D1: Widely patent, normal, retrogradely fills the entire LAD. SVG to OM1 and OM 2: Widely patent. SVG to PDA: Widely patent.  2D Echo   1. Left ventricular ejection fraction, by estimation, is 40 to 45%. The  left ventricle has mildly decreased function. The left ventricle  demonstrates regional wall motion abnormalities (see scoring  diagram/findings for  description). Left ventricular  diastolic parameters are indeterminate.   2. Right ventricular systolic function is normal. The right ventricular  size is normal. Tricuspid regurgitation signal is inadequate for assessing  PA pressure.   3. The mitral valve is normal in structure. Trivial mitral valve  regurgitation. No evidence of mitral stenosis.   4. The aortic valve is tricuspid. Aortic valve regurgitation is not  visualized. No aortic stenosis is present.   5. The inferior vena cava is normal in size with greater than 50%  respiratory variability, suggesting right atrial pressure of 3 mmHg.    Objective:   Weight Range: 83.7 kg Body mass index is 31.67 kg/m.   Vital Signs:   Temp:  [97.9 F (36.6 C)-98.9 F (37.2 C)] 98.8 F (37.1 C) (08/15 1115) Pulse Rate:  [47-89] 53 (08/15 1300) Resp:  [14-27] 17 (08/15 1300) BP: (62-160)/(45-95) 137/62 (08/15 1300) SpO2:  [86 %-100 %] 92 % (08/15 1300) Weight:  [83.7 kg-85.4 kg] 83.7 kg (08/15 0615) Last BM Date : 04/07/24  Weight change: Filed Weights   04/07/24 1800 04/08/24 0615  Weight: 85.4 kg 83.7 kg    Intake/Output:   Intake/Output Summary (Last 24 hours) at 04/08/2024 1446 Last data filed at 04/08/2024 0200 Gross per 24 hour  Intake 1040 ml  Output 600 ml  Net 440 ml      Physical Exam    General:  Well appearing, sitting up on side of bed. No resp  difficulty HEENT: Normal Neck: Supple. JVP not elevated.  Cor: PMI nondisplaced. Regular rate & rhythm.  Lungs: Clear Abdomen: Soft, nontender, nondistended. No hepatosplenomegaly. No bruits or masses. Good bowel sounds. Extremities: No cyanosis, clubbing, rash, edema Neuro: Alert & orientedx3, cranial nerves grossly intact. moves all 4 extremities w/o difficulty. Affect pleasant   Telemetry   NSR 90s, on VT/VF   EKG    No new EKG to review   Labs    CBC Recent Labs    04/07/24 1609 04/07/24 1610 04/07/24 2018 04/08/24 0230  WBC 21.2*  --  20.9*  17.4*  NEUTROABS 18.0*  --   --   --   HGB 14.1   < > 15.1 13.6  HCT 40.8   < > 44.1 39.9  MCV 89.7  --  91.1 90.7  PLT 224  --  211 228   < > = values in this interval not displayed.   Basic Metabolic Panel Recent Labs    91/85/74 2018 04/08/24 0230  NA 137 137  K 4.2 4.3  CL 108 106  CO2 20* 21*  GLUCOSE 141* 137*  BUN 12 11  CREATININE 0.77 0.75  CALCIUM  8.7* 8.5*  MG 2.1 1.9   Liver Function Tests Recent Labs    04/07/24 1609 04/08/24 0230  AST 66* 48*  ALT 57* 51*  ALKPHOS 104 102  BILITOT 0.8 0.9  PROT 6.4* 6.0*  ALBUMIN 3.8 3.4*   No results for input(s): LIPASE, AMYLASE in the last 72 hours. Cardiac Enzymes No results for input(s): CKTOTAL, CKMB, CKMBINDEX, TROPONINI in the last 72 hours.  BNP: BNP (last 3 results) No results for input(s): BNP in the last 8760 hours.  ProBNP (last 3 results) No results for input(s): PROBNP in the last 8760 hours.   D-Dimer No results for input(s): DDIMER in the last 72 hours. Hemoglobin A1C Recent Labs    04/07/24 1609  HGBA1C 5.4   Fasting Lipid Panel Recent Labs    04/07/24 1609  CHOL 133  HDL 43  LDLCALC 76  TRIG 69  CHOLHDL 3.1   Thyroid Function Tests No results for input(s): TSH, T4TOTAL, T3FREE, THYROIDAB in the last 72 hours.  Invalid input(s): FREET3  Other results:   Imaging    CT HEAD WO CONTRAST ( ) Result Date: 04/07/2024 CLINICAL DATA:  Initial evaluation for acute syncope/presyncope. EXAM: CT HEAD WITHOUT CONTRAST TECHNIQUE: Contiguous axial images were obtained from the base of the skull through the vertex without intravenous contrast. RADIATION DOSE REDUCTION: This exam was performed according to the departmental dose-optimization program which includes automated exposure control, adjustment of the mA and/or kV according to patient size and/or use of iterative reconstruction technique. COMPARISON:  Prior study from 03/25/2018. FINDINGS: Brain:  Examination mildly limited by somewhat limited as the posterior fossa is partially obscured by artifact. Cerebral volume within normal limits. No acute intracranial hemorrhage. No acute large vessel territory infarct. No mass lesion, midline shift or mass effect. No hydrocephalus or extra-axial fluid collection. Vascular: No abnormal hyperdense vessel. Skull: Scalp soft tissues within normal limits.  Calvarium intact. Sinuses/Orbits: Globes orbital soft tissues within normal limits. Moderate mucosal thickening present about the ethmoidal air cells and maxillary sinuses. Superimposed air-fluid level with pneumatized secretions within the maxillary sinuses. No mastoid effusion. Other: Sclerotic lesion involving the right pterygoid plate noted, suspected to reflect a benign bone island, stable. IMPRESSION: 1. No acute intracranial abnormality. 2. Moderate ethmoidal and maxillary sinusitis. Electronically Signed   By: Morene  Nicholes [name]   On: 04/07/2024 22:37   ECHOCARDIOGRAM COMPLETE Result Date: 04/07/2024    ECHOCARDIOGRAM REPORT   Patient Name:   [name] Date of Exam: 04/07/2024 Medical Rec #:  [id number]     Height:       64.0 in Accession #:    [id number]    Weight:       188.3 lb Date of Birth:  [birth year]     BSA:          1.907 m Patient Age:    67 years      BP:           137/73 mmHg Patient Gender: M             HR:           80 bpm. Exam Location:  Inpatient Procedure: 2D Echo, Cardiac Doppler, Color Doppler and Intracardiac            Opacification Agent (Both Spectral and Color Flow Doppler were            utilized during procedure). Indications:     Syncope R55, Ventricular Tachycardia I47.2  History:         Patient has prior history of Echocardiogram examinations, most                  recent 10/06/2023. CAD, Prior CABG, COPD, Signs/Symptoms:Chest                  Pain and Shortness of Breath; Risk Factors:Hypertension and                  Dyslipidemia.  Sonographer:     Thea Norlander  RCS Referring Phys:  [id number] RAPHAEL SAILOR DUNN Diagnosing Phys: Lonni Nanas MD IMPRESSIONS  1. Left ventricular ejection fraction, by estimation, is 40 to 45%. The left ventricle has mildly decreased function. The left ventricle demonstrates regional wall motion abnormalities (see scoring diagram/findings for description). Left ventricular diastolic parameters are indeterminate.  2. Right ventricular systolic function is normal. The right ventricular size is normal. Tricuspid regurgitation signal is inadequate for assessing PA pressure.  3. The mitral valve is normal in structure. Trivial mitral valve regurgitation. No evidence of mitral stenosis.  4. The aortic valve is tricuspid. Aortic valve regurgitation is not visualized. No aortic stenosis is present.  5. The inferior vena cava is normal in size with greater than 50% respiratory variability, suggesting right atrial pressure of 3 mmHg. FINDINGS  Left Ventricle: Left ventricular ejection fraction, by estimation, is 40 to 45%. The left ventricle has mildly decreased function. The left ventricle demonstrates regional wall motion abnormalities. Definity  contrast agent was given IV to delineate the left ventricular endocardial borders. The left ventricular internal cavity size was normal in size. There is no left ventricular hypertrophy. Left ventricular diastolic parameters are indeterminate.  LV Wall Scoring: The inferior wall and posterior wall are akinetic. The entire anterior wall, antero-lateral wall, entire septum, and entire apex are normal. Right Ventricle: The right ventricular size is normal. No increase in right ventricular wall thickness. Right ventricular systolic function is normal. Tricuspid regurgitation signal is inadequate for assessing PA pressure. Left Atrium: Left atrial size was normal in size. Right Atrium: Right atrial size was normal in size. Pericardium: There is no evidence of pericardial effusion. Mitral Valve: The mitral valve is  normal in structure. Trivial mitral valve regurgitation. No evidence of mitral valve stenosis. Tricuspid Valve: The tricuspid valve is normal in  structure. Tricuspid valve regurgitation is trivial. Aortic Valve: The aortic valve is tricuspid. Aortic valve regurgitation is not visualized. No aortic stenosis is present. Aortic valve peak gradient measures 6.9 mmHg. Pulmonic Valve: The pulmonic valve was not well visualized. Pulmonic valve regurgitation is trivial. Aorta: The aortic root and ascending aorta are structurally normal, with no evidence of dilitation. Venous: The inferior vena cava is normal in size with greater than 50% respiratory variability, suggesting right atrial pressure of 3 mmHg. IAS/Shunts: The interatrial septum was not well visualized.  LEFT VENTRICLE PLAX 2D LVIDd:         5.70 cm   Diastology LVIDs:         4.40 cm   LV e' medial:    6.85 cm/s LV PW:         1.10 cm   LV E/e' medial:  15.9 LV IVS:        0.80 cm   LV e' lateral:   10.70 cm/s LVOT diam:     2.10 cm   LV E/e' lateral: 10.2 LV SV:         59 LV SV Index:   31 LVOT Area:     3.46 cm  RIGHT VENTRICLE             IVC RV S prime:     12.30 cm/s  IVC diam: 1.70 cm TAPSE (M-mode): 1.5 cm LEFT ATRIUM             Index        RIGHT ATRIUM           Index LA diam:        4.60 cm 2.41 cm/m   RA Area:     11.70 cm LA Vol (A2C):   63.2 ml 33.15 ml/m  RA Volume:   24.50 ml  12.85 ml/m LA Vol (A4C):   36.7 ml 19.25 ml/m LA Biplane Vol: 48.5 ml 25.44 ml/m  AORTIC VALVE AV Area (Vmax): 2.41 cm AV Vmax:        131.00 cm/s AV Peak Grad:   6.9 mmHg LVOT Vmax:      91.20 cm/s LVOT Vmean:     58.200 cm/s LVOT VTI:       0.169 m  AORTA Ao Root diam: 3.00 cm Ao Asc diam:  3.00 cm MITRAL VALVE MV Area (PHT): 4.80 cm     SHUNTS MV Decel Time: 158 msec     Systemic VTI:  0.17 m MV E velocity: 109.00 cm/s  Systemic Diam: 2.10 cm MV A velocity: 75.50 cm/s MV E/A ratio:  1.44 Lonni Nanas MD Electronically signed by Lonni Nanas MD  Signature Date/Time: 04/07/2024/9:51:43 PM    Final    CARDIAC CATHETERIZATION Result Date: 04/07/2024 Images from the original result were not included. Cardiac Catheterization 04/07/24: Hemodynamic data: LV 99/-6, EDP 4 mmHg.  Ao 99/52, mean 70 mmHg.  No pressure gradient across aortic valve. Angiographic data: LV: Inferior akinesis.  LVEF 40%.  No significant MR. LM: Small diffusely diseased vessel. LAD: Occluded in the proximal segment.  LAD is supplied by saphenous vein graft to D1. LCx: Small to moderate caliber vessel giving origin to large OM1 and OM 2.  OM1 is occluded in the ostium and OM 2 has a proximal 90% stenosis.  There is skip graft of SVG to OM1 and OM 2. RCA: It is large-caliber vessel, occluded in the proximal segment after the origin of RV branch.  Could not be cannulated well,  reviewed his angiograms from 2014. LIMA to LAD was known atretic. SVG to to D1: Widely patent, normal, retrogradely fills the entire LAD. SVG to OM1 and OM 2: Widely patent. SVG to PDA: Widely patent. Impression and recommendations: No change in coronary anatomy from 2014.  Widely patent grafts.  No change in his LVEF with inferior wall scar.  Suggest he needs ICD implantation for secondary prevention of sudden cardiac death.     Medications:     Scheduled Medications:  aspirin  EC  81 mg Oral q morning   Chlorhexidine  Gluconate Cloth  6 each Topical Daily   magnesium  oxide  400 mg Oral BID   metoprolol  tartrate  50 mg Oral BID   pravastatin   40 mg Oral q1800   sodium chloride  flush  3 mL Intravenous Q12H    Infusions:  sodium chloride       PRN Medications: sodium chloride , acetaminophen , albuterol , fentaNYL  (SUBLIMAZE ) injection, nitroGLYCERIN , ondansetron  (ZOFRAN ) IV, mouth rinse, oxyCODONE , sodium chloride  flush     Assessment/Plan   1. OOH VT Arrest  - quick ROSC w/ bystander CPR, neurologically intact  - initial K 3.4, Mg 1.9 - Echo EF 40-45%, RV ok  - LHC w/ stable disease/ no  culprit lesions - EP following presumed to be scar mediated, cMRI completed (results pending) - anticipate ICD placement prior to d/c  2. CAD s/p Prior CABG  - Hs trops this admit elevated but low level, likely demand ischemia 2/2 VT. Not c/w ACS - LHC w/ stable disease from prior study (see above), no obstructive lesions requiring intervention  - continue medical management  3. HFrEF  - post arrest Echo EF 40-45%, RV ok (EF previously normal on echo 2/25) - suspect stunned myocardium post VF arrest  - cMRI pending  - on irbesartan  PTA, can resume at half dose 75 mg daily   D/w EP, ok to transfer out of the ICU today. Team C/EP to follow.   Heart failure team will sign off as of 04/08/24  Follow up as an outpatient in the HF clinic ?  No  If no please follow up with [facility] after discharge.     Length of Stay: 1  Caffie Shed, PA-C  04/08/2024, 2:46 PM  Advanced Heart Failure Team Pager [phone] (M-F; 7a - 5p)  Please contact [facility] for night-coverage after hours (5p -7a ) and weekends on [email or web address]  Patient seen and examined with the above-signed Advanced Practice Provider and/or Housestaff. I personally reviewed laboratory data, imaging studies and relevant notes. I independently examined the patient and formulated the important aspects of the plan. I have edited the note to reflect any of my changes or salient points. I have personally discussed the plan with the patient and/or family.  Doing well. Neuro intact. Rhythm stable. EF 40-45%  cMRI EF 45% with previous infarct scare Personally reviewed   General:  Well appearing. No resp difficulty HEENT: normal Neck: supple. no JVD. Carotids 2+ bilat; no bruits. No lymphadenopathy or thryomegaly appreciated. Cor: PMI nondisplaced. Regular rate & rhythm. No rubs, gallops or murmurs. Lungs: clear Abdomen: soft, nontender, nondistended. No hepatosplenomegaly. No bruits or masses. Good bowel sounds. Extremities: no  cyanosis, clubbing, rash, edema Neuro: alert & orientedx3, cranial nerves grossly intact. moves all 4 extremities w/o difficulty. Affect pleasant  Suspect scar-mediated VT/VF.   Supp lytes. Titrate GDMT. EP has seen and plan for ICD Monday  Can transfer to floor.   Toribio Fuel, MD  5:30 PM

## 2024-04-08 NOTE — Plan of Care (Signed)

## 2024-04-08 NOTE — Progress Notes (Addendum)
   Patient Name: [name] Date of Encounter: 04/08/2024 [facility] Cardiologist: Evalene Lunger, MD   Interval Summary  .    Episode of 45 sec unresponsiveness on 8/14 evening as detailed  Vital Signs .    Vitals:   04/08/24 1230 04/08/24 1245 04/08/24 1300 04/08/24 1500  BP: (!) 150/71 123/76 137/62   Pulse: 66 63 (!) 53   Resp: (!) 23 19 17    Temp:    98.1 F (36.7 C)  TempSrc:    Oral  SpO2: 91% 94% 92%   Weight:        Intake/Output Summary (Last 24 hours) at 04/08/2024 1523 Last data filed at 04/08/2024 0200 Gross per 24 hour  Intake 1040 ml  Output 600 ml  Net 440 ml      04/08/2024    6:15 AM 04/07/2024    6:00 PM 02/25/2024    9:59 AM  Last 3 Weights  Weight (lbs) 184 lb 8.4 oz 188 lb 4.4 oz 189 lb  Weight (kg) 83.7 kg 85.4 kg 85.73 kg      Telemetry/ECG     - Personally Reviewed Occasional brief NSVTs and sinus bradycardia in 50s  Physical Exam .   Physical Exam Vitals and nursing note reviewed.  Constitutional:      General: He is not in acute distress. Neck:     Vascular: No JVD.  Cardiovascular:     Rate and Rhythm: Normal rate and regular rhythm.     Heart sounds: Normal heart sounds. No murmur heard. Pulmonary:     Effort: Pulmonary effort is normal.     Breath sounds: Normal breath sounds. No wheezing or rales.  Musculoskeletal:     Right lower leg: No edema.     Left lower leg: No edema.      Assessment & Plan .    68 year old male with hypertension, hyperlipidemia, CAD s/p CABG (known atretic LIMA-LAD, SVG-diagonal, SVG-OM1/OM 2, SVG-PDA), former smoker with COPD, habitual alcohol intake, with out-of-hospital cardiac arrest due to VT, no acute coronary etiology on coronary angiogram 8/14  VT cardiac arrest: Likely due to scar mediated VT from ischemic cardiomyopathy EF 40 to 45%. Underwent cardiac MRI today with potential plans for ICD soon. Episode of 45-second unresponsiveness on 04/07/2024 without loss of pulse or  arrhythmia bradycardia arrhythmia.  This may have been a vasovagal episode in the setting of recent diarrhea and nausea.  No further arrhythmia on telemetry.  Continue to monitor for now. Added oxycodone  for pain control post CPR. Will get CXR.   HFrEF: Ischemic cardiomyopathy EF 40-45%. No clinical heart failure.  Now back on irbesartan , increase to 75 mg daily. Will check Entresto cost. Start metoprolol  succinate 50 mg daily.  CAD: No angina. Trop elevation likely secondary to cardiac arrest. Change pravastatin  to rosuvastatin .    Can transfer to stepdown after ICD placement/  For questions or updates, please contact [facility] Please consult [email or web address] for contact info under        Signed, Newman JINNY Lawrence, MD

## 2024-04-08 NOTE — Telephone Encounter (Signed)
 Patient Product/process development scientist completed.    The patient is insured through [employer]. Patient has Medicare and is not eligible for a copay card, but may be able to apply for patient assistance or Medicare RX Payment Plan (Patient Must reach out to their plan, if eligible for payment plan), if available.    Ran test claim for Entresto 24-26 mg and the current 30 day co-pay is $19.04.   This test claim was processed through [facility]- copay amounts may vary at other pharmacies due to pharmacy/plan contracts, or as the patient moves through the different stages of their insurance plan.     Reyes Sharps, CPHT Pharmacy Technician III Certified Patient Advocate [facility] Patient Advocate Team Direct Number: [phone]  Fax: [phone]

## 2024-04-09 LAB — BASIC METABOLIC PANEL WITH GFR
Anion gap: 10 (ref 5–15)
BUN: 11 mg/dL (ref 8–23)
CO2: 22 mmol/L (ref 22–32)
Calcium: 8.7 mg/dL — ABNORMAL LOW (ref 8.9–10.3)
Chloride: 101 mmol/L (ref 98–111)
Creatinine, Ser: 0.75 mg/dL (ref 0.61–1.24)
GFR, Estimated: >60 mL/min (ref >60)
Glucose, Bld: 111 mg/dL — ABNORMAL HIGH (ref 70–99)
Potassium: 4.1 mmol/L (ref 3.5–5.1)
Sodium: 133 mmol/L — ABNORMAL LOW (ref 135–145)

## 2024-04-09 LAB — MAGNESIUM: Magnesium: 2.1 mg/dL (ref 1.7–2.4)

## 2024-04-09 LAB — LIPOPROTEIN A (LPA): Lipoprotein (a): 16.6 nmol/L (ref <75.0)

## 2024-04-09 LAB — GLUCOSE, CAPILLARY: Glucose-Capillary: 99 mg/dL (ref 70–99)

## 2024-04-09 MED ORDER — BUDESON-GLYCOPYRROL-FORMOTEROL 160-9-4.8 MCG/ACT IN AERO
2.0000 | INHALATION_SPRAY | Freq: Two times a day (BID) | RESPIRATORY_TRACT | Status: DC
  Administered 2024-04-09 – 2024-04-12 (×6): 2 via RESPIRATORY_TRACT
  Filled 2024-04-09: qty 5.9

## 2024-04-09 NOTE — Progress Notes (Signed)
   Rounding Note    Patient Name: [name] Date of Encounter: 04/09/2024  [facility] Cardiologist: Timothy Gollan, MD   Subjective   Wife now diagnosed with Covid. Patient remains asymptomatic but now on a COVID rule out. No recurrent VT/VF.  Vital Signs    Vitals:   04/09/24 0600 04/09/24 0630 04/09/24 0700 04/09/24 0730  BP: 128/62 (!) 125/51 132/69 131/88  Pulse: (!) 55 (!) 53 (!) 57 62  Resp: 19 (!) 25 17 18   Temp:      TempSrc:      SpO2: 94% 94% 95% 95%  Weight:        Intake/Output Summary (Last 24 hours) at 04/09/2024 0748 Last data filed at 04/09/2024 0100 Gross per 24 hour  Intake 720 ml  Output 1000 ml  Net -280 ml      04/08/2024    6:15 AM 04/07/2024    6:00 PM 02/25/2024    9:59 AM  Last 3 Weights  Weight (lbs) 184 lb 8.4 oz 188 lb 4.4 oz 189 lb  Weight (kg) 83.7 kg 85.4 kg 85.73 kg      Telemetry    Personally Reviewed  Physical Exam    GEN: No acute distress.   Cardiac: RRR, no murmurs, rubs, or gallops.  Respiratory: Clear to auscultation bilaterally. Psych: Normal affect   Assessment & Plan    #OHCA #VT #ICM #CAD Electrically quiescent. DDD ICD planned for next week.  Continue metoprolol . Cont irbesartan .  Cont aspirin  Cont statin   Transfer to floor.    Ole T. Cindie, MD, [facility], [facility] Cardiac Electrophysiology

## 2024-04-10 LAB — BASIC METABOLIC PANEL WITH GFR
Anion gap: 8 (ref 5–15)
BUN: 10 mg/dL (ref 8–23)
CO2: 23 mmol/L (ref 22–32)
Calcium: 8.6 mg/dL — ABNORMAL LOW (ref 8.9–10.3)
Chloride: 105 mmol/L (ref 98–111)
Creatinine, Ser: 0.8 mg/dL (ref 0.61–1.24)
GFR, Estimated: >60 mL/min (ref >60)
Glucose, Bld: 89 mg/dL (ref 70–99)
Potassium: 4 mmol/L (ref 3.5–5.1)
Sodium: 136 mmol/L (ref 135–145)

## 2024-04-10 MED ORDER — SODIUM CHLORIDE 0.9 % IV SOLN: INTRAVENOUS | Status: DC

## 2024-04-10 MED ORDER — CHLORHEXIDINE GLUCONATE 4 % EX SOLN: 60.0000 mL | Freq: Once | CUTANEOUS | Status: DC

## 2024-04-10 MED ORDER — SODIUM CHLORIDE 0.9 % IV SOLN
80.0000 mg | INTRAVENOUS | Status: AC
  Administered 2024-04-11: 80 mg
  Filled 2024-04-10: qty 2

## 2024-04-10 MED ORDER — VANCOMYCIN HCL IN DEXTROSE 1-5 GM/200ML-% IV SOLN
1000.0000 mg | INTRAVENOUS | Status: AC
  Administered 2024-04-11: 1000 mg via INTRAVENOUS

## 2024-04-10 NOTE — Progress Notes (Signed)
 Per Dr. Hiram request, generic pre-ICD orders placed. As we do not yet know who the operator will be, EP team will need to place consent order in AM. I will send msg to EP APP to review tomorrow. Pt has PCN allergy - per the allergy algorithm under Allergies/Contraindications, since patient answered yes to one of the PCN allergy questions, not authorized for cephalosporin use so vancomycin  selected preop instead of cefazolin.

## 2024-04-10 NOTE — Progress Notes (Signed)
   EP rounding Note    Patient Name: [name] Date of Encounter: 04/10/2024  [facility] Cardiologist: Evalene Lunger, MD   Subjective   No acute events overnight.  He is up and moving around the room this morning.  Wife joins by telephone this morning.  Vital Signs    Vitals:   04/09/24 1632 04/09/24 1957 04/09/24 2350 04/10/24 0415  BP: 137/74 (!) 144/63 119/64 122/67  Pulse: 64 60 (!) 57 (!) 56  Resp: 20 18 18 17   Temp: 98.4 F (36.9 C) 98.2 F (36.8 C) 98.1 F (36.7 C) 98 F (36.7 C)  TempSrc: Oral Oral Oral Oral  SpO2: 96% 96% 92% 92%  Weight:        Intake/Output Summary (Last 24 hours) at 04/10/2024 0858 Last data filed at 04/09/2024 1300 Gross per 24 hour  Intake 480 ml  Output 475 ml  Net 5 ml      04/08/2024    6:15 AM 04/07/2024    6:00 PM 02/25/2024    9:59 AM  Last 3 Weights  Weight (lbs) 184 lb 8.4 oz 188 lb 4.4 oz 189 lb  Weight (kg) 83.7 kg 85.4 kg 85.73 kg      Telemetry    Personally Reviewed  Physical Exam    GEN: No acute distress.   Cardiac: RRR, no murmurs, rubs, or gallops.  Respiratory: Clear to auscultation bilaterally. Psych: Normal affect   Assessment & Plan    #OHCA #VT #ICM #CAD Electrically quiescent. DDD ICD planned for next week.  Continue metoprolol . Cont irbesartan .  Cont aspirin  Cont statin  Keep n.p.o. after midnight.  Timing of defibrillator TBD.    Ole T. Cindie, MD, [facility], [facility] Cardiac Electrophysiology

## 2024-04-11 ENCOUNTER — Inpatient Hospital Stay (HOSPITAL_COMMUNITY)
Admission: EM | Disposition: A | Discharge status: Home / Self Care | Payer: Self-pay | Source: Home / Self Care | Attending: Cardiology

## 2024-04-11 HISTORY — PX: ICD IMPLANT: EP1208

## 2024-04-11 LAB — BASIC METABOLIC PANEL WITH GFR
Anion gap: 10 (ref 5–15)
BUN: 13 mg/dL (ref 8–23)
CO2: 24 mmol/L (ref 22–32)
Calcium: 8.8 mg/dL — ABNORMAL LOW (ref 8.9–10.3)
Chloride: 103 mmol/L (ref 98–111)
Creatinine, Ser: 0.83 mg/dL (ref 0.61–1.24)
GFR, Estimated: >60 mL/min (ref >60)
Glucose, Bld: 96 mg/dL (ref 70–99)
Potassium: 4.1 mmol/L (ref 3.5–5.1)
Sodium: 137 mmol/L (ref 135–145)

## 2024-04-11 LAB — CBC
HCT: 46.7 % (ref 39.0–52.0)
Hemoglobin: 15.7 g/dL (ref 13.0–17.0)
MCH: 31.2 pg (ref 26.0–34.0)
MCHC: 33.6 g/dL (ref 30.0–36.0)
MCV: 92.8 fL (ref 80.0–100.0)
Platelets: 245 K/uL (ref 150–400)
RBC: 5.03 MIL/uL (ref 4.22–5.81)
RDW: 12.3 % (ref 11.5–15.5)
WBC: 14.1 K/uL — ABNORMAL HIGH (ref 4.0–10.5)
nRBC: 0 % (ref 0.0–0.2)

## 2024-04-11 SURGERY — ICD IMPLANT
Anesthesia: LOCAL

## 2024-04-11 MED ORDER — VANCOMYCIN HCL IN DEXTROSE 1-5 GM/200ML-% IV SOLN
INTRAVENOUS | Status: AC
  Filled 2024-04-11: qty 200

## 2024-04-11 MED ORDER — FENTANYL CITRATE (PF) 100 MCG/2ML IJ SOLN
INTRAMUSCULAR | Status: AC
  Filled 2024-04-11: qty 2

## 2024-04-11 MED ORDER — LIDOCAINE HCL (PF) 1 % IJ SOLN
INTRAMUSCULAR | Status: DC | PRN
  Administered 2024-04-11: 60 mL

## 2024-04-11 MED ORDER — GENTAMICIN SULFATE 40 MG/ML IJ SOLN
INTRAMUSCULAR | Status: AC
  Filled 2024-04-11: qty 2

## 2024-04-11 MED ORDER — FENTANYL CITRATE (PF) 100 MCG/2ML IJ SOLN
INTRAMUSCULAR | Status: DC | PRN
  Administered 2024-04-11: 50 ug via INTRAVENOUS
  Administered 2024-04-11 (×3): 25 ug via INTRAVENOUS

## 2024-04-11 MED ORDER — MIDAZOLAM HCL 2 MG/2ML IJ SOLN
INTRAMUSCULAR | Status: AC
  Filled 2024-04-11: qty 2

## 2024-04-11 MED ORDER — MIDAZOLAM HCL 5 MG/5ML IJ SOLN
INTRAMUSCULAR | Status: DC | PRN
  Administered 2024-04-11 (×3): .5 mg via INTRAVENOUS
  Administered 2024-04-11: 1 mg via INTRAVENOUS

## 2024-04-11 MED ORDER — LIDOCAINE HCL (PF) 1 % IJ SOLN
INTRAMUSCULAR | Status: AC
Start: 2024-04-11 — End: 2024-04-11
  Filled 2024-04-11: qty 60

## 2024-04-11 SURGICAL SUPPLY — 10 items
CABLE SURGICAL S-101-97-12 (CABLE) ×1 IMPLANT
ELECT DEFIB PAD ADLT CADENCE (PAD) IMPLANT
ICD VIGILANT DR D233 (Pacemaker) IMPLANT
LEAD INGEVITY 7841 52 (Lead) IMPLANT
LEAD RELIANCE 0672 IMPLANT
PAD DEFIB RADIO PHYSIO CONN (PAD) ×1 IMPLANT
SHEATH 7FR PRELUDE SNAP 13 (SHEATH) IMPLANT
SHEATH 8FR PRELUDE SNAP 13 (SHEATH) IMPLANT
SHEATH PROBE COVER 6X72 (BAG) IMPLANT
TRAY PACEMAKER INSERTION (PACKS) ×1 IMPLANT

## 2024-04-11 NOTE — Interval H&P Note (Signed)
 History and Physical Interval Note:  04/11/2024 6:41 PM  [name]  has presented today for surgery, with the diagnosis of Cardiac arrest.  The various methods of treatment have been discussed with the patient and family. After consideration of risks, benefits and other options for treatment, the patient has consented to  Procedure(s): ICD IMPLANT (N/A) as a surgical intervention.  The patient's history has been reviewed, patient examined, no change in status, stable for surgery.  I have reviewed the patient's chart and labs.  Questions were answered to the patient's satisfaction.     [name]

## 2024-04-11 NOTE — H&P (View-Only) (Signed)
 Rounding Note   Patient Name: [name] Date of Encounter: 04/11/2024  [facility] Cardiologist: Evalene Lunger, MD   Subjective  Feels well, no CP, SOB Memory is sluggish post arrest  Wife is home with COVID > seems short term rehab at discharge for him 2/2 this  Scheduled Meds:  aspirin  EC  81 mg Oral q morning   budesonide -glycopyrrolate -formoterol   2 puff Inhalation BID   chlorhexidine   60 mL Topical Once   chlorhexidine   60 mL Topical Once   Chlorhexidine  Gluconate Cloth  6 each Topical Daily   gentamicin  (GARAMYCIN ) 80 mg in sodium chloride  0.9 % 500 mL irrigation  80 mg Irrigation On Call   irbesartan   75 mg Oral Daily   magnesium  oxide  400 mg Oral BID   metoprolol  succinate  50 mg Oral Daily   rosuvastatin   20 mg Oral Daily   sodium chloride  flush  3 mL Intravenous Q12H   Continuous Infusions:  sodium chloride  Stopped (04/11/24 0621)   vancomycin      PRN Meds: acetaminophen , albuterol , fentaNYL  (SUBLIMAZE ) injection, nitroGLYCERIN , ondansetron  (ZOFRAN ) IV, mouth rinse, oxyCODONE , sodium chloride  flush   Vital Signs  Vitals:   04/10/24 1910 04/10/24 2058 04/11/24 0800 04/11/24 0842  BP: (!) 149/71  96/66 (!) 150/83  Pulse: 63  65   Resp: 20  15 18   Temp: 98 F (36.7 C)  98 F (36.7 C)   TempSrc: Oral  Oral   SpO2: 97% 97% 100%   Weight:        Intake/Output Summary (Last 24 hours) at 04/11/2024 0939 Last data filed at 04/11/2024 0846 Gross per 24 hour  Intake 3.87 ml  Output --  Net 3.87 ml      04/08/2024    6:15 AM 04/07/2024    6:00 PM 02/25/2024    9:59 AM  Last 3 Weights  Weight (lbs) 184 lb 8.4 oz 188 lb 4.4 oz 189 lb  Weight (kg) 83.7 kg 85.4 kg 85.73 kg      Telemetry SR 50's-60's - Personally Reviewed  ECG  No new EKGs - Personally Reviewed  Physical Exam  GEN: No acute distress.   Neck: No JVD Cardiac: RRR, no murmurs, rubs, or gallops.  Respiratory: Clear to auscultation bilaterally. GI: Soft, nontender,  non-distended  MS: No edema; No deformity. Neuro:  Nonfocal  Psych: Normal affect   Labs High Sensitivity Troponin:   Recent Labs  Lab 04/07/24 1609 04/07/24 1737  TROPONINIHS 163* 677*     Chemistry Recent Labs  Lab 04/07/24 1609 04/07/24 1610 04/07/24 2018 04/08/24 0230 04/09/24 0924 04/10/24 0322 04/11/24 0429  NA 139   < > 137 137 133* 136 137  K 3.4*   < > 4.2 4.3 4.1 4.0 4.1  CL 107   < > 108 106 101 105 103  CO2 19*  --  20* 21* 22 23 24   GLUCOSE 145*   < > 141* 137* 111* 89 96  BUN 12   < > 12 11 11 10 13   CREATININE 0.95   < > 0.77 0.75 0.75 0.80 0.83  CALCIUM  8.8*  --  8.7* 8.5* 8.7* 8.6* 8.8*  MG  --    < > 2.1 1.9 2.1  --   --   PROT 6.4*  --   --  6.0*  --   --   --   ALBUMIN 3.8  --   --  3.4*  --   --   --  AST 66*  --   --  48*  --   --   --   ALT 57*  --   --  51*  --   --   --   ALKPHOS 104  --   --  102  --   --   --   BILITOT 0.8  --   --  0.9  --   --   --   GFRNONAA >60  --  >60 >60 >60 >60 >60  ANIONGAP 13  --  9 10 10 8 10    < > = values in this interval not displayed.    Lipids  Recent Labs  Lab Apr 10, 2024 1609  CHOL 133  TRIG 69  HDL 43  LDLCALC 76  CHOLHDL 3.1    Hematology Recent Labs  Lab Apr 10, 2024 1609 04-10-2024 1610 04/10/2024 2018 04/08/24 0230  WBC 21.2*  --  20.9* 17.4*  RBC 4.55  --  4.84 4.40  HGB 14.1 13.9 15.1 13.6  HCT 40.8 41.0 44.1 39.9  MCV 89.7  --  91.1 90.7  MCH 31.0  --  31.2 30.9  MCHC 34.6  --  34.2 34.1  RDW 12.5  --  12.5 12.4  PLT 224  --  211 228   Thyroid No results for input(s): TSH, FREET4 in the last 168 hours.  BNPNo results for input(s): BNP, PROBNP in the last 168 hours.  DDimer No results for input(s): DDIMER in the last 168 hours.   Radiology  No results found.  Cardiac Studies   04/08/24: c.MRI IMPRESSION: 1. Mildly dilated LV with basal to mid inferior hypokinesis and mid to apical anterolateral hypokinesis, LV EF 45%.   2.  Normal RV size with RV EF 44%.   3. LGE  pattern in the basal inferoseptal, basal to mid inferior, and apical lateral walls suggests prior myocardial infarction in those areas. Thickest scar was in the basal to mid inferior wall.   4. Mildly elevated extracellular volume percentage suggests increased myocardial fibrotic content.   04-10-24: LHC Impression and recommendations: No change in coronary anatomy from 2014.  Widely patent grafts.  No change in his LVEF with inferior wall scar.  Suggest he needs ICD implantation for secondary prevention of sudden cardiac death.   Apr 10, 2024: TTE 1. Left ventricular ejection fraction, by estimation, is 40 to 45%. The  left ventricle has mildly decreased function. The left ventricle  demonstrates regional wall motion abnormalities (see scoring  diagram/findings for description). Left ventricular  diastolic parameters are indeterminate.   2. Right ventricular systolic function is normal. The right ventricular  size is normal. Tricuspid regurgitation signal is inadequate for assessing  PA pressure.   3. The mitral valve is normal in structure. Trivial mitral valve  regurgitation. No evidence of mitral stenosis.   4. The aortic valve is tricuspid. Aortic valve regurgitation is not  visualized. No aortic stenosis is present.   5. The inferior vena cava is normal in size with greater than 50%  respiratory variability, suggesting right atrial pressure of 3 mmHg.    Patient Profile   68 y.o. male w/PMHx of  CAD s/p CABG 2003, HTN, HLD,former tobacco abuse, chronic leukocytosis, habitual ETOH intake, chronic SOB/COPD   Admitted after OOH cardiac arrest EMS found him in a wide-complex tachycardia. Lidocaine  was started  taken to the Cath Lab. Coronary angiography showed severe but stable disease.  Assessment & Plan     Cardiac arrest VT MRI w/ scar Stable CAD  w/patent grafts LVEF 40-45%  Planned for ICD implant today Dr. Kennyth has been bedside, discussed with the patient He is  agreeable   ICM CAD No CP/SOB Volume stable  5. Leukocytosis Trending down Afebrile COVID neg No symptoms of illness Likely reactive 2/2 arrest   For questions or updates, please contact [facility] Please consult [email or web address] for contact info under     Signed, Charlies Macario Arthur, PA-C  04/11/2024, 9:39 AM    I have seen, examined the patient, and reviewed the above assessment and plan.    Interval:  No acute overnight events. Patient reports feeling relatively well. No new or acute complaints.   General: Well developed, in no acute distress.  Neck: No JVD.  Cardiac: Normal rate, regular rhythm.  Resp: Normal work of breathing.  Ext: No edema.  Neuro: No gross focal deficits.  Psych: Normal affect.   Assessment: The patient was admitted after a cardiac arrest at home. I suspect his arrest was secondary to scar mediated ventricular tachycardia.  #OHCA #VT -Explained risks, benefits, and alternatives to ICD implantation, including but not limited to bleeding, infection, damage to heart or lungs, heart attack, stroke, or death.  Pt verbalized understanding and agrees to proceed if indicated.   #ICM -Continue metoprolol . -Cont irbesartan .  #CAD  -Cont aspirin  and statin.   Fonda Kennyth, MD 04/11/2024 6:37 PM

## 2024-04-11 NOTE — Progress Notes (Signed)
Patient back from procedure.

## 2024-04-11 NOTE — Progress Notes (Addendum)
 Rounding Note   Patient Name: [name] Date of Encounter: 04/11/2024   [facility] Cardiologist: Evalene Lunger, MD   Subjective  Feels well, no CP, SOB Memory is sluggish post arrest  Wife is home with COVID > seems short term rehab at discharge for him 2/2 this  Scheduled Meds:  aspirin  EC  81 mg Oral q morning   budesonide -glycopyrrolate -formoterol   2 puff Inhalation BID   chlorhexidine   60 mL Topical Once   chlorhexidine   60 mL Topical Once   Chlorhexidine  Gluconate Cloth  6 each Topical Daily   gentamicin  (GARAMYCIN ) 80 mg in sodium chloride  0.9 % 500 mL irrigation  80 mg Irrigation On Call   irbesartan   75 mg Oral Daily   magnesium  oxide  400 mg Oral BID   metoprolol  succinate  50 mg Oral Daily   rosuvastatin   20 mg Oral Daily   sodium chloride  flush  3 mL Intravenous Q12H   Continuous Infusions:  sodium chloride  Stopped (04/11/24 0621)   vancomycin      PRN Meds: acetaminophen , albuterol , fentaNYL  (SUBLIMAZE ) injection, nitroGLYCERIN , ondansetron  (ZOFRAN ) IV, mouth rinse, oxyCODONE , sodium chloride  flush   Vital Signs  Vitals:   04/10/24 1910 04/10/24 2058 04/11/24 0800 04/11/24 0842  BP: (!) 149/71  96/66 (!) 150/83  Pulse: 63  65   Resp: 20  15 18   Temp: 98 F (36.7 C)  98 F (36.7 C)   TempSrc: Oral  Oral   SpO2: 97% 97% 100%   Weight:        Intake/Output Summary (Last 24 hours) at 04/11/2024 0939 Last data filed at 04/11/2024 0846 Gross per 24 hour  Intake 3.87 ml  Output --  Net 3.87 ml      04/08/2024    6:15 AM 04/07/2024    6:00 PM 02/25/2024    9:59 AM  Last 3 Weights  Weight (lbs) 184 lb 8.4 oz 188 lb 4.4 oz 189 lb  Weight (kg) 83.7 kg 85.4 kg 85.73 kg      Telemetry SR 50's-60's - Personally Reviewed  ECG  No new EKGs - Personally Reviewed  Physical Exam  GEN: No acute distress.   Neck: No JVD Cardiac: RRR, no murmurs, rubs, or gallops.  Respiratory: Clear to auscultation bilaterally. GI: Soft, nontender,  non-distended  MS: No edema; No deformity. Neuro:  Nonfocal  Psych: Normal affect   Labs High Sensitivity Troponin:   Recent Labs  Lab 04/07/24 1609 04/07/24 1737  TROPONINIHS 163* 677*     Chemistry Recent Labs  Lab 04/07/24 1609 04/07/24 1610 04/07/24 2018 04/08/24 0230 04/09/24 0924 04/10/24 0322 04/11/24 0429  NA 139   < > 137 137 133* 136 137  K 3.4*   < > 4.2 4.3 4.1 4.0 4.1  CL 107   < > 108 106 101 105 103  CO2 19*  --  20* 21* 22 23 24   GLUCOSE 145*   < > 141* 137* 111* 89 96  BUN 12   < > 12 11 11 10 13   CREATININE 0.95   < > 0.77 0.75 0.75 0.80 0.83  CALCIUM  8.8*  --  8.7* 8.5* 8.7* 8.6* 8.8*  MG  --    < > 2.1 1.9 2.1  --   --   PROT 6.4*  --   --  6.0*  --   --   --   ALBUMIN 3.8  --   --  3.4*  --   --   --  AST 66*  --   --  48*  --   --   --   ALT 57*  --   --  51*  --   --   --   ALKPHOS 104  --   --  102  --   --   --   BILITOT 0.8  --   --  0.9  --   --   --   GFRNONAA >60  --  >60 >60 >60 >60 >60  ANIONGAP 13  --  9 10 10 8 10    < > = values in this interval not displayed.    Lipids  Recent Labs  Lab 2024/04/21 1609  CHOL 133  TRIG 69  HDL 43  LDLCALC 76  CHOLHDL 3.1    Hematology Recent Labs  Lab 2024-04-21 1609 2024/04/21 1610 Apr 21, 2024 2018 04/08/24 0230  WBC 21.2*  --  20.9* 17.4*  RBC 4.55  --  4.84 4.40  HGB 14.1 13.9 15.1 13.6  HCT 40.8 41.0 44.1 39.9  MCV 89.7  --  91.1 90.7  MCH 31.0  --  31.2 30.9  MCHC 34.6  --  34.2 34.1  RDW 12.5  --  12.5 12.4  PLT 224  --  211 228   Thyroid No results for input(s): TSH, FREET4 in the last 168 hours.  BNPNo results for input(s): BNP, PROBNP in the last 168 hours.  DDimer No results for input(s): DDIMER in the last 168 hours.   Radiology  No results found.  Cardiac Studies   04/08/24: c.MRI IMPRESSION: 1. Mildly dilated LV with basal to mid inferior hypokinesis and mid to apical anterolateral hypokinesis, LV EF 45%.   2.  Normal RV size with RV EF 44%.   3. LGE  pattern in the basal inferoseptal, basal to mid inferior, and apical lateral walls suggests prior myocardial infarction in those areas. Thickest scar was in the basal to mid inferior wall.   4. Mildly elevated extracellular volume percentage suggests increased myocardial fibrotic content.   2024-04-21: LHC Impression and recommendations: No change in coronary anatomy from 2014.  Widely patent grafts.  No change in his LVEF with inferior wall scar.  Suggest he needs ICD implantation for secondary prevention of sudden cardiac death.   April 21, 2024: TTE 1. Left ventricular ejection fraction, by estimation, is 40 to 45%. The  left ventricle has mildly decreased function. The left ventricle  demonstrates regional wall motion abnormalities (see scoring  diagram/findings for description). Left ventricular  diastolic parameters are indeterminate.   2. Right ventricular systolic function is normal. The right ventricular  size is normal. Tricuspid regurgitation signal is inadequate for assessing  PA pressure.   3. The mitral valve is normal in structure. Trivial mitral valve  regurgitation. No evidence of mitral stenosis.   4. The aortic valve is tricuspid. Aortic valve regurgitation is not  visualized. No aortic stenosis is present.   5. The inferior vena cava is normal in size with greater than 50%  respiratory variability, suggesting right atrial pressure of 3 mmHg.    Patient Profile   68 y.o. male w/PMHx of  CAD s/p CABG 2003, HTN, HLD,former tobacco abuse, chronic leukocytosis, habitual ETOH intake, chronic SOB/COPD   Admitted after OOH cardiac arrest EMS found him in a wide-complex tachycardia. Lidocaine  was started  taken to the Cath Lab. Coronary angiography showed severe but stable disease.  Assessment & Plan     Cardiac arrest VT MRI w/ scar Stable CAD  w/patent grafts LVEF 40-45%  Planned for ICD implant today Dr. Kennyth has been bedside, discussed with the patient He is  agreeable   ICM CAD No CP/SOB Volume stable  5. Leukocytosis Trending down Afebrile COVID neg No symptoms of illness Likely reactive 2/2 arrest   For questions or updates, please contact [facility] Please consult [email or web address] for contact info under     Signed, Charlies Macario Arthur, PA-C  04/11/2024, 9:39 AM    I have seen, examined the patient, and reviewed the above assessment and plan.    Interval:  No acute overnight events. Patient reports feeling relatively well. No new or acute complaints.   General: Well developed, in no acute distress.  Neck: No JVD.  Cardiac: Normal rate, regular rhythm.  Resp: Normal work of breathing.  Ext: No edema.  Neuro: No gross focal deficits.  Psych: Normal affect.   Assessment: The patient was admitted after a cardiac arrest at home. I suspect his arrest was secondary to scar mediated ventricular tachycardia.  #OHCA #VT -Explained risks, benefits, and alternatives to ICD implantation, including but not limited to bleeding, infection, damage to heart or lungs, heart attack, stroke, or death.  Pt verbalized understanding and agrees to proceed if indicated.   #ICM -Continue metoprolol . -Cont irbesartan .  #CAD  -Cont aspirin  and statin.   Fonda Kennyth, MD, [facility], [facility] Cardiac Electrophysiology

## 2024-04-12 ENCOUNTER — Other Ambulatory Visit (HOSPITAL_COMMUNITY): Payer: Self-pay

## 2024-04-12 ENCOUNTER — Inpatient Hospital Stay (HOSPITAL_COMMUNITY)

## 2024-04-12 ENCOUNTER — Encounter (HOSPITAL_COMMUNITY): Payer: Self-pay | Admitting: Cardiology

## 2024-04-12 LAB — BASIC METABOLIC PANEL WITH GFR
Anion gap: 10 (ref 5–15)
BUN: 16 mg/dL (ref 8–23)
CO2: 25 mmol/L (ref 22–32)
Calcium: 9.1 mg/dL (ref 8.9–10.3)
Chloride: 101 mmol/L (ref 98–111)
Creatinine, Ser: 0.87 mg/dL (ref 0.61–1.24)
GFR, Estimated: >60 mL/min (ref >60)
Glucose, Bld: 103 mg/dL — ABNORMAL HIGH (ref 70–99)
Potassium: 4.1 mmol/L (ref 3.5–5.1)
Sodium: 136 mmol/L (ref 135–145)

## 2024-04-12 MED ORDER — METOPROLOL SUCCINATE ER 50 MG PO TB24
50.0000 mg | ORAL_TABLET | Freq: Every day | ORAL | 6 refills | Status: DC
  Filled 2024-04-12: qty 30, 30d supply, fill #0

## 2024-04-12 NOTE — Progress Notes (Signed)
 AVS reviewed Pt expressed verbal understanding of plan of care. Family at bedside.  PIV and tele removed. Dressing CDI, tele called.  VSS.  Volunteer called for discharge will go to [facility] pharmacy upon discharge.

## 2024-04-12 NOTE — Discharge Instructions (Addendum)
 No driving 6 months or until cleared by your physician  After Your ICD (Implantable Cardiac Defibrillator)   You have a Environmental manager ICD  ACTIVITY Do not lift your arm above shoulder height for 1 week after your procedure. After 7 days, you may progress as below.  You should remove your sling 24 hours after your procedure, unless otherwise instructed by your provider.     Tuesday April 19, 2024  Wednesday April 20, 2024 Thursday April 21, 2024 Friday April 22, 2024   Do not lift, push, pull, or carry anything over 10 pounds with the affected arm until 6 weeks (Tuesday May 24, 2024 ) after your procedure.   Ask your healthcare provider when you can go back to work   INCISION/Dressing If you are on a blood thinner such as Coumadin, Xarelto, Eliquis, Plavix, or Pradaxa please confirm with your provider when this should be resumed.   If large square, outer bandage is left in place, this can be removed after 24 hours from your procedure. Do not remove steri-strips or glue as below.   Monitor your defibrillator site for redness, swelling, and drainage. Call the device clinic at [phone] if you experience these symptoms or fever/chills.  If your incision is sealed with Steri-strips or staples, you may shower 7 days after your procedure or when told by your provider. Do not remove the steri-strips or let the shower hit directly on your site. You may wash around your site with soap and water .    If you were discharged in a sling, please do not wear this during the day more than 48 hours after your surgery unless otherwise instructed. This may increase the risk of stiffness and soreness in your shoulder.   Avoid lotions, ointments, or perfumes over your incision until it is well-healed.  You may use a hot tub or a pool AFTER your wound check appointment if the incision is completely closed.  Your ICD is designed to protect you from life threatening heart rhythms. Because  of this, you may receive a shock.   1 shock with no symptoms:  Call the office during business hours. 1 shock with symptoms (chest pain, chest pressure, dizziness, lightheadedness, shortness of breath, overall feeling unwell):  Call 911. If you experience 2 or more shocks in 24 hours:  Call 911. If you receive a shock, you should not drive for 6 months per the [place] DMV IF you receive appropriate therapy from your ICD.   ICD Alerts:  Some alerts are vibratory and others beep. These are NOT emergencies. Please call our office to let us  know. If this occurs at night or on weekends, it can wait until the next business day. Send a remote transmission.  If your device is capable of reading fluid status (for heart failure), you will be offered monthly monitoring to review this with you.   DEVICE MANAGEMENT Remote monitoring is used to monitor your ICD from home. This monitoring is scheduled every 91 days by our office. It allows us  to keep an eye on the functioning of your device to ensure it is working properly. You will routinely see your Electrophysiologist annually (more often if necessary).   You should receive your ID card for your new device in 4-8 weeks. Keep this card with you at all times once received. Consider wearing a medical alert bracelet or necklace.  Your ICD  may be MRI compatible. This will be discussed at your next office visit/wound check.  You should  avoid contact with strong electric or magnetic fields.   Do not use amateur (ham) radio equipment or electric (arc) welding torches. MP3 player headphones with magnets should not be used. Some devices are safe to use if held at least 12 inches (30 cm) from your defibrillator. These include power tools, lawn mowers, and speakers. If you are unsure if something is safe to use, ask your health care provider.  When using your cell phone, hold it to the ear that is on the opposite side from the defibrillator. Do not leave your cell phone in  a pocket over the defibrillator.  You may safely use electric blankets, heating pads, computers, and microwave ovens.  Call the office right away if: You have chest pain. You feel more than one shock. You feel more short of breath than you have felt before. You feel more light-headed than you have felt before. Your incision starts to open up.  This information is not intended to replace advice given to you by your health care provider. Make sure you discuss any questions you have with your health care provider.

## 2024-04-12 NOTE — Discharge Summary (Cosign Needed)
 ELECTROPHYSIOLOGY PROCEDURE DISCHARGE SUMMARY    Patient ID: [name],  MRN: [id number], DOB/AGE: 1956-03-30 68 y.o.  Admit date: 04/07/2024 Discharge date: 04/12/2024  Primary Care Physician: Orlando Dwayne NOVAK, FNP  Primary Cardiologist: Dr. Gollan Electrophysiologist: new > Dr. Kennyth  Primary Discharge Diagnosis:  Cardiac arrest VT  Secondary Discharge Diagnosis:  CAD CABG 2003 HTN COPD Chronic leukocytosis  Allergies  Allergen Reactions   Heparin (Bovine) Anaphylaxis    Other reaction(s): Shock (ALLERGY)  ?Coma  Other reaction(s): Shock (ALLERGY) ?Coma ?Coma Other reaction(s): Shock (ALLERGY) ?Coma    ?Coma   Codeine Other (See Comments)    Breaks out into a sweat & rash  Reaction: Sweating (intolerance); Breaks out into a sweat & rash   Heparin Other (See Comments)    Respiratory failure  Other Reaction(s): respiratory failure   Penicillin G Other (See Comments)   Penicillins Other (See Comments) and Rash    Other reaction(s): Diaphoresis / Sweating (intolerance)  Has patient had a PCN reaction causing immediate rash, facial/tongue/throat swelling, SOB or lightheadedness with hypotension: Yes  Has patient had a PCN reaction causing severe rash involving mucus membranes or skin necrosis: No  Has patient had a PCN reaction that required hospitalization No  Has patient had a PCN reaction occurring within the last 10 years: No  If all of the above answers are NO, then may proceed with Cephalosporin use.  Other Reaction(s): sweating  Reaction: Diaphoresis / Sweating (intolerance)   Other reaction(s): Diaphoresis / Sweating (intolerance)  Has patient had a PCN reaction causing immediate rash, facial/tongue/throat swelling, SOB or lightheadedness with hypotension: Yes  Has patient had a PCN reaction causing severe rash involving mucus membranes or skin necrosis: No  Has patient had a PCN reaction that required hospitalization No  Has patient  had a PCN reaction occurring within the last 10 years: No  If all of the above answers are NO, then may proceed with Cephalosporin use.    Reaction: Diaphoresis / Sweating (intolerance)   Tape Itching and Rash   Wound Dressing Adhesive Itching and Rash     Procedures This Admission:  1. LHC 04/07/24, Dr. Ladona Impression and recommendations: No change in coronary anatomy from 2014.  Widely patent grafts.  No change in his LVEF with inferior wall scar.  Suggest he needs ICD implantation for secondary prevention of sudden cardiac death. (See full report for details)  2.  Implantation of a BSci dual chamber ICD on 04/12/24 by Dr Kennyth.   DFT's were deferred at time of implant  There were no immediate post procedure complications. 2.  CXR on 04/12/24 demonstrated no pneumothorax status post device implantation.   Brief HPI: [name] is a 68 y.o. male suffered witness collapse with immediate bystander CPR > EMS found him in VT treated with lidocaine  that was successful in converting his rhythm  Hospital Course:  The patient was admitted, mild hypokalemia, leukocytosis and LFT elevation noted. elevated HS Trops WBCs chronically elevated Lidocaine  gtt not continued He was brought to the cath lab with stable CAD, patent grafts. Cardiac MRI LVEF 45%w/scar. TTE w/LVEF 40-45%, + [facility] EP consulted recommended ICD implant, no AAD initiated  LFTs, WBCs trending down No symptoms of illness  The patient  underwent implantation of an ICD with details as outlined in the procedure report. He was monitored on telemetry throughout his stay with SR, no recurrent VT.  Left/chest was without hematoma or ecchymosis.  The device was  interrogated and found to be functioning normally.  CXR was obtained and demonstrated no pneumothorax status post device implantation.  Wound care, arm mobility, and restrictions were reviewed with the patient.  The patient feels well, denies any CP or SOB, minimal implant  site discomfort, no cath site discomfort, he was examined by Dr. Kennyth and considered stable for discharge to home.   Patient's wife is home with COVID, pt will discharge to his son's residence until able to got back home  no driving 6 months or until cleared by MD was discussed Stop home amlodipine  Start metoprolol  (getting here) 50mg  daily  instructions reviewed with patient (and wife on the phone)  Physical Exam: Vitals:   04/11/24 2330 04/12/24 0352 04/12/24 0808 04/12/24 0818  BP: (!) 144/88 131/77 (!) 159/87 (!) 159/87  Pulse: 71 (!) 56 70 70  Resp: 20 17 19    Temp: 97.8 F (36.6 C) 97.9 F (36.6 C) 98.2 F (36.8 C)   TempSrc: Oral Oral Oral   SpO2: 91% 94% 92%   Weight:        GEN- The patient is well appearing, alert and oriented x 3 today.   HEENT: normocephalic, atraumatic; sclera clear, conjunctiva pink; hearing intact; oropharynx clear Lungs- CTA b/l, normal work of breathing.  No wheezes, rales, rhonchi Heart- RRR, no murmurs, rubs or gallops, PMI not laterally displaced GI- soft, non-tender, non-distended Extremities- no clubbing, cyanosis, or edema R groin site is stable, no bleeding, hematoma, bruit MS- no significant deformity or atrophy Skin- warm and dry, no rash or lesion, left chest without hematoma/ecchymosis Psych- euthymic mood, full affect Neuro- no gross defecits  Labs:   Lab Results  Component Value Date   WBC 14.1 (H) 04/11/2024   HGB 15.7 04/11/2024   HCT 46.7 04/11/2024   MCV 92.8 04/11/2024   PLT 245 04/11/2024    Recent Labs  Lab 04/08/24 0230 04/09/24 0924 04/12/24 0406  NA 137   < > 136  K 4.3   < > 4.1  CL 106   < > 101  CO2 21*   < > 25  BUN 11   < > 16  CREATININE 0.75   < > 0.87  CALCIUM  8.5*   < > 9.1  PROT 6.0*  --   --   BILITOT 0.9  --   --   ALKPHOS 102  --   --   ALT 51*  --   --   AST 48*  --   --   GLUCOSE 137*   < > 103*   < > = values in this interval not displayed.    Discharge Medications:   Allergies as of 04/12/2024       Reactions   Heparin (bovine) Anaphylaxis   Other reaction(s): Shock (ALLERGY) ?Coma Other reaction(s): Shock (ALLERGY) ?Coma ?Coma Other reaction(s): Shock (ALLERGY) ?Coma    ?Coma   Codeine Other (See Comments)   Breaks out into a sweat & rash Reaction: Sweating (intolerance); Breaks out into a sweat & rash   Heparin Other (See Comments)   Respiratory failure Other Reaction(s): respiratory failure   Penicillin G Other (See Comments)   Penicillins Other (See Comments), Rash   Other reaction(s): Diaphoresis / Sweating (intolerance) Has patient had a PCN reaction causing immediate rash, facial/tongue/throat swelling, SOB or lightheadedness with hypotension: Yes Has patient had a PCN reaction causing severe rash involving mucus membranes or skin necrosis: No Has patient had a PCN reaction that required hospitalization No Has  patient had a PCN reaction occurring within the last 10 years: No If all of the above answers are NO, then may proceed with Cephalosporin use. Other Reaction(s): sweating Reaction: Diaphoresis / Sweating (intolerance)  Other reaction(s): Diaphoresis / Sweating (intolerance)  Has patient had a PCN reaction causing immediate rash, facial/tongue/throat swelling, SOB or lightheadedness with hypotension: Yes  Has patient had a PCN reaction causing severe rash involving mucus membranes or skin necrosis: No  Has patient had a PCN reaction that required hospitalization No  Has patient had a PCN reaction occurring within the last 10 years: No  If all of the above answers are NO, then may proceed with Cephalosporin use.    Reaction: Diaphoresis / Sweating (intolerance)   Tape Itching, Rash   Wound Dressing Adhesive Itching, Rash        Medication List     STOP taking these medications    amLODipine  10 MG tablet Commonly known as: NORVASC        TAKE these medications    aspirin  EC 81 MG tablet Take 1 tablet (81 mg total) by  mouth every morning.   Breztri  Aerosphere 160-9-4.8 MCG/ACT Aero inhaler Generic drug: budesonide -glycopyrrolate -formoterol  Inhale 2 puffs into the lungs in the morning and at bedtime.   irbesartan  150 MG tablet Commonly known as: AVAPRO  TAKE 1 TABLET BY MOUTH EVERY DAY   metoprolol  succinate 50 MG 24 hr tablet Commonly known as: TOPROL -XL Take 1 tablet (50 mg total) by mouth daily. Take with or immediately following a meal. Start taking on: April 13, 2024   nitroGLYCERIN  0.4 MG SL tablet Commonly known as: NITROSTAT  Place 1 tablet (0.4 mg total) under the tongue every 5 (five) minutes as needed for chest pain.   pravastatin  40 MG tablet Commonly known as: PRAVACHOL  TAKE 1 TABLET BY MOUTH EVERYDAY AT BEDTIME   tadalafil  20 MG tablet Commonly known as: CIALIS  Take 20 mg by mouth daily as needed.   Ventolin  HFA 108 (90 Base) MCG/ACT inhaler Generic drug: albuterol  Inhale 2 puffs into the lungs.        Disposition: Home    Signed, Charlies Arthur, PA-C 04/12/2024 11:07 AM   I have seen, examined the patient, and reviewed the above assessment and plan.    Hospital course: Patient presented to ED after a reported cardiac arrest at home, suspected VT/VF.  He underwent left heart catheterization which showed patency of his prior grafts and no acute changes.  He underwent secondary prevention ICD implant on 8/18.  He was monitored overnight with no obvious complications.  He was discharged with follow-up arranged.  General: Well developed, in no acute distress.  Neck: No JVD.  Cardiac: Normal rate, regular rhythm.  Left chest ICD pocket without bleeding or hematoma. Resp: Normal work of breathing.  Ext: No edema.  Neuro: No gross focal deficits.  Psych: Normal affect.   Assessment and plan:  #OHCA #VT #S/p ICD -Chest x-ray with appropriate lead position and no pneumothorax. -Bedside device interrogation was performed with appropriate device function and stable lead  parameters.  We did create a VT zone at 170 bpm with therapies, after finding ECG from EMS with nonsustained VT around 175 bpm. - Usual post implant instructions regarding activity restrictions and wound care were provided. - Follow-up in device clinic in approximately 2 weeks.  Duration of discharge encounter: 45 minutes.  [name], MD 04/13/2024 9:38 AM

## 2024-04-12 NOTE — Care Management Important Message (Signed)
 Important Message  Patient Details  Name: [name] MRN: [id number] Date of Birth: [birth year]   Important Message Given:  Yes - Medicare IM     [name] 04/12/2024, 11:16 AM

## 2024-04-12 NOTE — Plan of Care (Signed)
  Problem: Education: Goal: Knowledge of General Education information will improve Description: Including pain rating scale, medication(s)/side effects and non-pharmacologic comfort measures Outcome: Progressing   Problem: Health Behavior/Discharge Planning: Goal: Ability to manage health-related needs will improve Outcome: Progressing   Problem: Clinical Measurements: Goal: Ability to maintain clinical measurements within normal limits will improve Outcome: Progressing Goal: Will remain free from infection Outcome: Progressing Goal: Diagnostic test results will improve Outcome: Progressing Goal: Respiratory complications will improve Outcome: Progressing Goal: Cardiovascular complication will be avoided Outcome: Progressing   Problem: Activity: Goal: Risk for activity intolerance will decrease Outcome: Progressing   Problem: Nutrition: Goal: Adequate nutrition will be maintained Outcome: Progressing   Problem: Coping: Goal: Level of anxiety will decrease Outcome: Progressing   Problem: Elimination: Goal: Will not experience complications related to bowel motility Outcome: Progressing Goal: Will not experience complications related to urinary retention Outcome: Progressing   Problem: Pain Managment: Goal: General experience of comfort will improve and/or be controlled Outcome: Progressing   Problem: Safety: Goal: Ability to remain free from injury will improve Outcome: Progressing   Problem: Skin Integrity: Goal: Risk for impaired skin integrity will decrease Outcome: Progressing   Problem: Education: Goal: Understanding of CV disease, CV risk reduction, and recovery process will improve Outcome: Progressing Goal: Individualized Educational Video(s) Outcome: Progressing   Problem: Activity: Goal: Ability to return to baseline activity level will improve Outcome: Progressing   Problem: Cardiovascular: Goal: Ability to achieve and maintain adequate  cardiovascular perfusion will improve Outcome: Progressing Goal: Vascular access site(s) Level 0-1 will be maintained Outcome: Progressing   Problem: Health Behavior/Discharge Planning: Goal: Ability to safely manage health-related needs after discharge will improve Outcome: Progressing   Problem: Education: Goal: Knowledge of cardiac device and self-care will improve Outcome: Progressing Goal: Ability to safely manage health related needs after discharge will improve Outcome: Progressing Goal: Individualized Educational Video(s) Outcome: Progressing   Problem: Cardiac: Goal: Ability to achieve and maintain adequate cardiopulmonary perfusion will improve Outcome: Progressing

## 2024-04-12 NOTE — Progress Notes (Signed)
 Heart Failure Navigator Progress Note  Assessed for Heart & Vascular TOC clinic readiness.  Patient does not meet criteria due to patient has a scheduled hospital follow up in [facility] on 05/03/2024. [facility] HF TOC.    Navigator will sign off at this time.   Stephane Haddock, BSN, Scientist, clinical (histocompatibility and immunogenetics) Only

## 2024-04-12 NOTE — Progress Notes (Signed)
 Mobility Specialist Progress Note:   04/12/24 0915  Mobility  Activity Ambulated with assistance  Level of Assistance Modified independent, requires aide device or extra time  Assistive Device None  Distance Ambulated (ft) 15 ft  Activity Response Tolerated well  Mobility Referral Yes  Mobility visit 1 Mobility  Mobility Specialist Start Time (ACUTE ONLY) 0915  Mobility Specialist Stop Time (ACUTE ONLY) 0920  Mobility Specialist Time Calculation (min) (ACUTE ONLY) 5 min   Pt received in bathroom. Requesting assistance to return to bed. Tolerated well, asx throughout. Lying in bed with all needs met.   Blakeleigh Domek Mobility Specialist Please contact via Special educational needs teacher or  Rehab office at [phone]

## 2024-04-12 NOTE — Plan of Care (Signed)
 Problem: Education: Goal: Knowledge of General Education information will improve Description: Including pain rating scale, medication(s)/side effects and non-pharmacologic comfort measures 04/12/2024 0518 by Baldwin Doran BIRCH, RN Outcome: Not Progressing 04/12/2024 0452 by Baldwin Doran BIRCH, RN Outcome: Progressing   Problem: Health Behavior/Discharge Planning: Goal: Ability to manage health-related needs will improve 04/12/2024 0518 by Baldwin Doran BIRCH, RN Outcome: Not Progressing 04/12/2024 0452 by Baldwin Doran BIRCH, RN Outcome: Progressing   Problem: Clinical Measurements: Goal: Ability to maintain clinical measurements within normal limits will improve 04/12/2024 0518 by Baldwin Doran BIRCH, RN Outcome: Not Progressing 04/12/2024 0452 by Baldwin Doran BIRCH, RN Outcome: Progressing Goal: Will remain free from infection 04/12/2024 0518 by Baldwin Doran BIRCH, RN Outcome: Not Progressing 04/12/2024 0452 by Baldwin Doran BIRCH, RN Outcome: Progressing Goal: Diagnostic test results will improve 04/12/2024 0518 by Baldwin Doran BIRCH, RN Outcome: Not Progressing 04/12/2024 0452 by Baldwin Doran D, RN Outcome: Progressing Goal: Respiratory complications will improve 04/12/2024 0518 by Baldwin Doran BIRCH, RN Outcome: Not Progressing 04/12/2024 0452 by Baldwin Doran D, RN Outcome: Progressing Goal: Cardiovascular complication will be avoided 04/12/2024 0518 by Baldwin Doran BIRCH, RN Outcome: Not Progressing 04/12/2024 0452 by Baldwin Doran BIRCH, RN Outcome: Progressing   Problem: Activity: Goal: Risk for activity intolerance will decrease 04/12/2024 0518 by Baldwin Doran BIRCH, RN Outcome: Not Progressing 04/12/2024 0452 by Baldwin Doran BIRCH, RN Outcome: Progressing   Problem: Nutrition: Goal: Adequate nutrition will be maintained 04/12/2024 0518 by Baldwin Doran BIRCH, RN Outcome: Not Progressing 04/12/2024 0452 by Baldwin Doran D, RN Outcome: Progressing   Problem:  Coping: Goal: Level of anxiety will decrease 04/12/2024 0518 by Baldwin Doran BIRCH, RN Outcome: Not Progressing 04/12/2024 0452 by Baldwin Doran BIRCH, RN Outcome: Progressing   Problem: Elimination: Goal: Will not experience complications related to bowel motility 04/12/2024 0518 by Baldwin Doran BIRCH, RN Outcome: Not Progressing 04/12/2024 0452 by Baldwin Doran BIRCH, RN Outcome: Progressing Goal: Will not experience complications related to urinary retention 04/12/2024 0518 by Baldwin Doran BIRCH, RN Outcome: Not Progressing 04/12/2024 0452 by Baldwin Doran BIRCH, RN Outcome: Progressing   Problem: Pain Managment: Goal: General experience of comfort will improve and/or be controlled 04/12/2024 0518 by Baldwin Doran BIRCH, RN Outcome: Not Progressing 04/12/2024 0452 by Baldwin Doran BIRCH, RN Outcome: Progressing   Problem: Safety: Goal: Ability to remain free from injury will improve 04/12/2024 0518 by Baldwin Doran BIRCH, RN Outcome: Not Progressing 04/12/2024 0452 by Baldwin Doran BIRCH, RN Outcome: Progressing   Problem: Skin Integrity: Goal: Risk for impaired skin integrity will decrease 04/12/2024 0518 by Baldwin Doran BIRCH, RN Outcome: Not Progressing 04/12/2024 0452 by Baldwin Doran BIRCH, RN Outcome: Progressing   Problem: Education: Goal: Understanding of CV disease, CV risk reduction, and recovery process will improve 04/12/2024 0518 by Baldwin Doran BIRCH, RN Outcome: Not Progressing 04/12/2024 0452 by Baldwin Doran BIRCH, RN Outcome: Progressing Goal: Individualized Educational Video(s) 04/12/2024 0518 by Baldwin Doran BIRCH, RN Outcome: Not Progressing 04/12/2024 0452 by Baldwin Doran BIRCH, RN Outcome: Progressing   Problem: Activity: Goal: Ability to return to baseline activity level will improve 04/12/2024 0518 by Baldwin Doran BIRCH, RN Outcome: Not Progressing 04/12/2024 0452 by Baldwin Doran BIRCH, RN Outcome: Progressing   Problem: Cardiovascular: Goal: Ability to  achieve and maintain adequate cardiovascular perfusion will improve 04/12/2024 0518 by Baldwin Doran BIRCH, RN Outcome: Not Progressing 04/12/2024 0452 by Baldwin Doran BIRCH, RN Outcome: Progressing Goal: Vascular access site(s) Level 0-1 will be maintained 04/12/2024 0518 by Baldwin Doran BIRCH, RN Outcome: Not Progressing  04/12/2024 0452 by Baldwin Doran BIRCH, RN Outcome: Progressing   Problem: Health Behavior/Discharge Planning: Goal: Ability to safely manage health-related needs after discharge will improve 04/12/2024 0518 by Baldwin Doran BIRCH, RN Outcome: Not Progressing 04/12/2024 0452 by Baldwin Doran BIRCH, RN Outcome: Progressing   Problem: Education: Goal: Knowledge of cardiac device and self-care will improve 04/12/2024 0518 by Baldwin Doran BIRCH, RN Outcome: Not Progressing 04/12/2024 0452 by Baldwin Doran BIRCH, RN Outcome: Progressing Goal: Ability to safely manage health related needs after discharge will improve 04/12/2024 0518 by Baldwin Doran BIRCH, RN Outcome: Not Progressing 04/12/2024 0452 by Baldwin Doran BIRCH, RN Outcome: Progressing Goal: Individualized Educational Video(s) 04/12/2024 0518 by Baldwin Doran BIRCH, RN Outcome: Not Progressing 04/12/2024 0452 by Baldwin Doran BIRCH, RN Outcome: Progressing   Problem: Cardiac: Goal: Ability to achieve and maintain adequate cardiopulmonary perfusion will improve 04/12/2024 0518 by Baldwin Doran BIRCH, RN Outcome: Not Progressing 04/12/2024 0452 by Baldwin Doran BIRCH, RN Outcome: Progressing

## 2024-04-14 MED FILL — Midazolam HCl Inj 2 MG/2ML (Base Equivalent): INTRAMUSCULAR | Qty: 2.5 | Status: AC

## 2024-04-20 ENCOUNTER — Telehealth: Payer: Self-pay

## 2024-04-20 NOTE — Telephone Encounter (Signed)
 Follow-up after same day discharge: Implant date: 04/11/2024 MD: JP Device: ICD BSX Location: L CHEST    Wound check visit: YES 90 day MD follow-up: YES  Remote Transmission received:YES  Dressing/sling removed: N/A  Confirm OAC restart on: N/A  Please continue to monitor your cardiac device site for redness, swelling, and drainage. Call the device clinic at [phone] if you experience these symptoms, fever/chills, or have questions about your device.   Remote monitoring is used to monitor your cardiac device from home. This monitoring is scheduled every 91 days by our office. It allows us  to keep an eye on the functioning of your device to ensure it is working properly.  LVM FOR PT TO CALL BACK

## 2024-04-22 ENCOUNTER — Ambulatory Visit: Attending: Internal Medicine

## 2024-04-22 DIAGNOSIS — I4901 Ventricular fibrillation: Secondary | ICD-10-CM | POA: Insufficient documentation

## 2024-04-22 DIAGNOSIS — I469 Cardiac arrest, cause unspecified: Secondary | ICD-10-CM | POA: Insufficient documentation

## 2024-04-22 NOTE — Patient Instructions (Addendum)
   After Your ICD (Implantable Cardiac Defibrillator)    Monitor your defibrillator site for redness, swelling, and drainage. Call the device clinic at [phone] if you experience these symptoms or fever/chills.  Your incision was closed with Steri-strips or staples:  You may shower 7 days after your procedure and wash your incision with soap and water . Avoid lotions, ointments, or perfumes over your incision until it is well-healed.  You may use a hot tub or a pool after your wound check appointment if the incision is completely closed.  Do not lift, push or pull greater than 10 pounds with the affected arm until 6 weeks after your procedure May 24, 2024. There are no other restrictions in arm movement after your wound check appointment.  Your ICD is designed to protect you from life threatening heart rhythms. Because of this, you may receive a shock.   1 shock with no symptoms:  Call the office during business hours. 1 shock with symptoms (chest pain, chest pressure, dizziness, lightheadedness, shortness of breath, overall feeling unwell):  Call 911. If you experience 2 or more shocks in 24 hours:  Call 911. If you receive a shock, you should not drive.  [place] DMV - no driving for 6 months if you receive appropriate therapy from your ICD.   ICD Alerts:  Some alerts are vibratory and others beep. These are NOT emergencies. Please call our office to let us  know. If this occurs at night or on weekends, it can wait until the next business day. Send a remote transmission.  If your device is capable of reading fluid status (for heart failure), you will be offered monthly monitoring to review this with you.   Remote monitoring is used to monitor your ICD from home. This monitoring is scheduled every 91 days by our office. It allows us  to keep an eye on the functioning of your device to ensure it is working properly. You will routinely see your Electrophysiologist annually (more often if  necessary). \

## 2024-04-25 ENCOUNTER — Telehealth: Payer: Self-pay

## 2024-04-25 NOTE — Progress Notes (Signed)
 Normal dual chamber ICD wound check. Wound well healed. Presenting rhythm: AP/VS . Routine testing performed. Thresholds, sensing, and impedance consistent with implant measurements with 3.5V safety margin/auto capture until 3 month visit. No treated arrhythmias. AT/AF burden 44.1%. AF noted in EGM, longest in duration 1 minute. No OAC on file. Will forward to MD to review.  Reviewed arm restrictions to continue for 6 weeks total post op. Reviewed shock plan.  Pt enrolled in remote follow-up.  Error in saving PDF.

## 2024-04-25 NOTE — Telephone Encounter (Signed)
 Error

## 2024-04-25 NOTE — Telephone Encounter (Signed)
 Late Entry 04/22/24.  Patient had ICD implant 04/11/2024 post cardiac arrest event 04/07/24.   During device clinic apt. patient and wife expressed concerns of patients experience during implant procedure. Patient request to be followed by another EP physician. Advised patient I will forward to Dr. Kennyth and Dr. Cindie to review and advise. Patient is seen in the [place] office d/t location of where he resides.  Explained to patient Dr. Cindie travels to [place] office and a NP is available if needed. Patient is agreeable.   Writer apologized to patient and wife about their experience and reassured patient our goal is to provide the best care to our patients.   Offered grievance line to patient. Patient declined.   Advised patient once writer has a response from Dr.Parker and Dr. Cindie, writer will be in touch with update.   Patient voiced understanding and was appreciative of assistance.

## 2024-04-26 NOTE — Telephone Encounter (Signed)
 Spoke to patients wife Macario advising Dr. Kennyth gave the ok for patient to see Dr. Cindie.   Routing to EP scheduling to change 91 day f/u to Dr. Cindie.

## 2024-04-28 ENCOUNTER — Encounter: Payer: Self-pay | Admitting: *Deleted

## 2024-04-28 NOTE — Telephone Encounter (Signed)
 Spoke w/ patient to reschedule patients 90 day follow up from [facility] from Dr. Kennyth, to see Dr. Cindie due to provider switch. Patient and patients wife were grateful for the call. While talking with them, they inquired about getting the card they received from [facility] changed from reflecting Dr. Carroll name, to now Dr. Cindie.   I spoke w/ device clinic RN, Anette, who stated that that is something that won't be able to be changed because Dr. Kennyth is the one who implanted the device. Left a voicemail with patient, with this information. My number given incase there are any questions.

## 2024-04-29 NOTE — Telephone Encounter (Signed)
 Manual transmission from patient received and reviewed   Presenting EGM c/w SR  Review of arrhythmia log book showed multiple ATR episodes with rates c/w A-fib/A-flutter occurring during waking hours  Longest episode: 2 hrs and 6 minutes no EGM available (SCAF)  AT/AF burden <1%  No hx of cryptogenic stroke or TIA noted in patient's chart  Called patient back to notify him of irregular events, and assess for symptoms  Patient denied experiencing any symptoms during longest recorded event or currently  Patient informed that we will continue monitoring his device for episodes of Afib longer than 6 hrs since anything less is considered sub-clinical  Patient instructed to call clinic (number given to pt) if he begins experiencing symptoms such as extreme fatigue or feels his heart racing/fluttering in his chest as these may be signs that he is in AF and we may request a manual transmission  ED precautions reviewed with patient and spouse incase pt becomes symptomatic over weekend  Patient and spouse both verbally acknowledged understanding of all the instructions/education provided by this RN  All questions and concerns addressed at this time  Patient and spouse both appreciative of phone call by this RN

## 2024-04-29 NOTE — Telephone Encounter (Signed)
 Please advise on new AF <6hrs

## 2024-05-03 ENCOUNTER — Encounter: Payer: Self-pay | Admitting: Cardiology

## 2024-05-03 ENCOUNTER — Ambulatory Visit: Attending: Cardiology | Admitting: Cardiology

## 2024-05-03 VITALS — BP 120/60 | HR 50 | Ht 64.0 in | Wt 189.0 lb

## 2024-05-03 DIAGNOSIS — F1010 Alcohol abuse, uncomplicated: Secondary | ICD-10-CM | POA: Insufficient documentation

## 2024-05-03 DIAGNOSIS — I472 Ventricular tachycardia, unspecified: Secondary | ICD-10-CM | POA: Diagnosis present

## 2024-05-03 DIAGNOSIS — I469 Cardiac arrest, cause unspecified: Secondary | ICD-10-CM | POA: Insufficient documentation

## 2024-05-03 DIAGNOSIS — I480 Paroxysmal atrial fibrillation: Secondary | ICD-10-CM | POA: Diagnosis present

## 2024-05-03 DIAGNOSIS — E782 Mixed hyperlipidemia: Secondary | ICD-10-CM | POA: Diagnosis not present

## 2024-05-03 DIAGNOSIS — J45909 Unspecified asthma, uncomplicated: Secondary | ICD-10-CM | POA: Diagnosis present

## 2024-05-03 DIAGNOSIS — I4901 Ventricular fibrillation: Secondary | ICD-10-CM | POA: Diagnosis not present

## 2024-05-03 DIAGNOSIS — I10 Essential (primary) hypertension: Secondary | ICD-10-CM | POA: Insufficient documentation

## 2024-05-03 DIAGNOSIS — I5022 Chronic systolic (congestive) heart failure: Secondary | ICD-10-CM | POA: Diagnosis present

## 2024-05-03 DIAGNOSIS — I25810 Atherosclerosis of coronary artery bypass graft(s) without angina pectoris: Secondary | ICD-10-CM | POA: Insufficient documentation

## 2024-05-03 DIAGNOSIS — J449 Chronic obstructive pulmonary disease, unspecified: Secondary | ICD-10-CM | POA: Diagnosis present

## 2024-05-03 DIAGNOSIS — Z95810 Presence of automatic (implantable) cardiac defibrillator: Secondary | ICD-10-CM | POA: Insufficient documentation

## 2024-05-03 MED ORDER — METOPROLOL SUCCINATE ER 25 MG PO TB24: 37.2500 mg | ORAL_TABLET | Freq: Every day | ORAL | 3 refills | Status: DC

## 2024-05-03 NOTE — Patient Instructions (Signed)
 Medication Instructions:  Your physician recommends the following medication changes.  DECREASE: Metoprolol  to 37.25 g daily   *If you need a refill on your cardiac medications before your next appointment, please call your pharmacy*  Lab Work: Your provider would like for you to have following labs drawn today CBC, CMP.   If you have labs (blood work) drawn today and your tests are completely normal, you will receive your results only by: MyChart Message (if you have MyChart) OR A paper copy in the mail If you have any lab test that is abnormal or we need to change your treatment, we will call you to review the results.  Testing/Procedures: Your physician has requested that you have an limited echocardiogram. Echocardiography is a painless test that uses sound waves to create images of your heart. It provides your doctor with information about the size and shape of your heart and how well your heart's chambers and valves are working.   You may receive an ultrasound enhancing agent through an IV if needed to better visualize your heart during the echo. This procedure takes approximately one hour.  There are no restrictions for this procedure.  This will take place at [address] [facility]) [address], [place] [address]  Please note: We ask at that you not bring children with you during ultrasound (echo/ vascular) testing. Due to room size and safety concerns, children are not allowed in the ultrasound rooms during exams. Our front office staff cannot provide observation of children in our lobby area while testing is being conducted. An adult accompanying a patient to their appointment will only be allowed in the ultrasound room at the discretion of the ultrasound technician under special circumstances. We apologize for any inconvenience.   Follow-Up: At [facility], you and your health needs are our priority.  As part of our continuing mission to provide you with  exceptional heart care, our providers are all part of one team.  This team includes your primary Cardiologist (physician) and Advanced Practice Providers or APPs (Physician Assistants and Nurse Practitioners) who all work together to provide you with the care you need, when you need it.  Your next appointment:   Keep December appointment with Dr. Gollan  Provider:   You may see Timothy Gollan, MD or one of the following Advanced Practice Providers on your designated Care Team:   Lonni Meager, NP Lesley Maffucci, PA-C Bernardino Bring, PA-C Cadence Encampment, PA-C Tylene Lunch, NP Barnie Hila, NP

## 2024-05-03 NOTE — Progress Notes (Signed)
 Cardiology Office Note   Date:  05/03/2024  ID:  ITALO BANTON, DOB 12/12/1955, MRN [id number] PCP: Orlando Dwayne NOVAK, FNP  [facility] Providers Cardiologist:  Evalene Lunger, MD Cardiology APP:  Gerard Frederick, NP  Electrophysiologist:  OLE ONEIDA HOLTS, MD     History of Present Illness DORMAN CALDERWOOD is a 68 y.o. male with a past medical history of coronary artery disease status post CABG in 2023 (LIMA to LAD, SVG to diagonal, SVG to OM, and SVG to the distal RCA), hyperlipidemia, hypertension, former tobacco abuse, chronic leukocytosis, minimal EtOH intake, chronic shortness of breath/COPD, who is here today for follow-up from hospitalization after recent cardiac arrest.   Longstanding history of coronary artery disease with CABG x 4 in 01/2022 with LIMA to the LAD, vein graft to diagonal, vein graft to OM, and vein graft to the distal RCA.  Repeat heart catheterization completed in 2014 showing patent grafts with severe three-vessel disease.  In recovery following his cardiac catheterization he had episodes of sweating/flushing with associated bradycardia.  Initially was felt that this was a vagal response to pressure being applied to his groin.  Later in recovery can continue to have bradycardia.  He was told to hold his metoprolol  at the time of discharge.  He had an additional episode in August 2015 where he was working and started sweating all over around 9 AM in the morning.  He felt weak after for approximately 3 days.  He had a 30-day event monitor in October 2015 given his continued symptoms of sweating, malaise, weakness.  Monitor did not show any significant arrhythmia.  No significant bradycardia was noted.  Myoview  in April 2023 showed no moderate size inferior wall defect, EF 44%, reported as left wrist per MD..  He was evaluated in the emergency department at [facility] June 01, 2022 for near syncope.  He stated was sitting in a chair at home, felt warm, diaphoretic, dizzy, and  pale.  Blood pressure was low at home.  EMS was called.  Vital signs were normal per EMS.  Symptoms lasted for approximately an hour.  Felt normal by the time he reached the emergency department.  He had a similar episodes 2 weeks earlier at that time heart rate was noted to be 130 bpm.  Workup in the emergency department was negative and he was discharged with a Zio patch.  Troponin was negative, EKG revealed normal sinus rhythm.  CT of the head showed no acute findings.  Monitor revealed sinus rhythm with rates of 57-81 with occasional ectopy, 49 SVT episodes with the longest lasting 17.7 seconds, and occasional PVCs with a 2.5% overall burden.  Echocardiogram completed in 05/2022 revealed low normal ejection fraction 50-55%, improved from prior study in 2017.  Systolic dysfunction was noted right ventricular function was normal.  There was no significant valvular abnormalities.  Last echocardiogram 09/2023 with an EF 55 to 60% with normal RV.  He was last seen in clinic by Dr. Gollan 08/04/2023 complaining of night sweats, chest spasms, shortness of breath, more frequent PVCs.  He was referred to pulmonary for baseline PFTs and consideration of lung scan.   He presented to [facility] via [facility] on 04/07/2024 woke up in his usual state of health and felt fine.  He reported chronic intermittent palpitations without acute change.  He been working outside doing yard work and collapsed.  He was reportedly initially gray and unresponsive.  His wife started CPR immediately and EMS was  called.  Per the notes upon EMS arrival the patient was in VT with a pulse, lidocaine  was given which converted temporarily, second dose given and a drip started.  Dr. Gollan she reports she was also notified the patient was groggy on EMS arrival.  He arrived to code awake and alert.  Case was paged out as a code STEMI due to cardiac arrest he had some chest soreness felt related to CPR but went directly to the Cath Lab.   There was no change in coronary anatomy from 2014 with widely patent grafts on LHC.  No change in his LVEF with inferior wall scar.  He had ICD implantation completed for secondary prevention of sudden cardiac death.  He went up with a Bsci dual-chamber ICD on 04/12/2024 by Dr. Kennyth.  TFTs were deferred at the time of implant.  There were no immediate postoperative complications.  Chest x-ray on 04/12/2024 demonstrated no pneumothorax status post device implantation.  Cardiac MRI revealed an LVEF of 45% with scar.  Patient was considered stable and stable to be discharged to the facility on 04/12/2024.  Unfortunately his wife was at home with COVID so he was good to be discharged home with his son with follow-up appointments made.  He returns to clinic today accompanied by his wife.  They have several questions and concerns today.  He states that overall he thinks that he has been doing well.  He continues to complain of right sided chest and side discomfort since he was hospitalized.  According to the patient and his wife he did have several x-rays done to look for broken ribs as he did have bystander CPR.  X-rays were unrevealing.  They do have concerns as they were called from his remote monitoring for his ICD and was advised that he had had atrial fibrillation and they were waiting a callback from the provider on next steps.  Patient continued to remain asymptomatic.  He has been compliant with his current medication regimen.  Unfortunately he is suffering from an increased amount of fatigue.  He states that previously he had been placed on metoprolol  and it was discontinued and replaced with amlodipine  and he had several questions related to being placed back on metoprolol  today.  They had several questions today related to getting a handicap tag for the car due to his fatigue he is unable to walk long distances.  He has been trying to abide by weight and movement restrictions to his left upper  extremity.  ROS: 10 point review of systems has been reviewed and considered negative exception was been listed in the HPI  Studies Reviewed EKG Interpretation Date/Time:  Tuesday May 03 2024 13:45:31 EDT Ventricular Rate:  50 PR Interval:  188 QRS Duration:  96 QT Interval:  470 QTC Calculation: 428 R Axis:   -5  Text Interpretation: Atrial-paced rhythm Inferior infarct , age undetermined Anteroseptal infarct , age undetermined When compared with ECG of 07-Apr-2024 15:40, PREVIOUS ECG IS PRESENT No significant change since last tracing Confirmed by Gerard Frederick ([id number]) on 05/03/2024 1:47:23 PM    cMRI 04/08/2024 IMPRESSION: 1. Mildly dilated LV with basal to mid inferior hypokinesis and mid to apical anterolateral hypokinesis, LV EF 45%.   2.  Normal RV size with RV EF 44%.   3. LGE pattern in the basal inferoseptal, basal to mid inferior, and apical lateral walls suggests prior myocardial infarction in those areas. Thickest scar was in the basal to mid inferior wall.   4.  Mildly elevated extracellular volume percentage suggests increased myocardial fibrotic content.   2d echo 04/07/2024 1. Left ventricular ejection fraction, by estimation, is 40 to 45%. The  left ventricle has mildly decreased function. The left ventricle  demonstrates regional wall motion abnormalities (see scoring  diagram/findings for description). Left ventricular  diastolic parameters are indeterminate.   2. Right ventricular systolic function is normal. The right ventricular  size is normal. Tricuspid regurgitation signal is inadequate for assessing  PA pressure.   3. The mitral valve is normal in structure. Trivial mitral valve  regurgitation. No evidence of mitral stenosis.   4. The aortic valve is tricuspid. Aortic valve regurgitation is not  visualized. No aortic stenosis is present.   5. The inferior vena cava is normal in size with greater than 50%  respiratory variability, suggesting  right atrial pressure of 3 mmHg.   Cardiac Catheterization 04/07/24: Hemodynamic data: LV 99/-6, EDP 4 mmHg.  Ao 99/52, mean 70 mmHg.  No pressure gradient across aortic valve.   Angiographic data: LV: Inferior akinesis.  LVEF 40%.  No significant MR. LM: Small diffusely diseased vessel. LAD: Occluded in the proximal segment.  LAD is supplied by saphenous vein graft to D1. LCx: Small to moderate caliber vessel giving origin to large OM1 and OM 2.  OM1 is occluded in the ostium and OM 2 has a proximal 90% stenosis.  There is skip graft of SVG to OM1 and OM 2. RCA: It is large-caliber vessel, occluded in the proximal segment after the origin of RV branch.  Could not be cannulated well, reviewed his angiograms from 2014.   LIMA to LAD was known atretic. SVG to to D1: Widely patent, normal, retrogradely fills the entire LAD. SVG to OM1 and OM 2: Widely patent. SVG to PDA: Widely patent.  2d echo 09/2023  1. Left ventricular ejection fraction, by estimation, is 55 to 60%. The left ventricle has normal function. The left ventricle has no regional wall motion abnormalities. Left ventricular diastolic parameters were  normal. The average left ventricular global longitudinal strain is -10.9 %. The global longitudinal strain is  abnormal.   2. Right ventricular systolic function is normal. The right ventricular size is normal. Tricuspid regurgitation signal is inadequate for assessing PA pressure.   3. The mitral valve is normal in structure. No evidence of mitral valve regurgitation. No evidence of mitral stenosis.   4. The aortic valve is normal in structure. Aortic valve regurgitation is not visualized. No aortic stenosis is present.   5. The inferior vena cava is normal in size with greater than 50% respiratory variability, suggesting right atrial pressure of 3 mmHg.    Risk Assessment/Calculations   CHA2DS2-VASc Score = 3   This indicates a 3.2% annual risk of stroke. The patient's score is  based upon: CHF History: 1 HTN History: 1 Diabetes History: 0 Stroke History: 0 Vascular Disease History: 0 Age Score: 1 Gender Score: 0             Physical Exam VS:  BP 120/60 (BP Location: Left Arm, Patient Position: Sitting, Cuff Size: Normal)   Pulse (!) 50   Ht 5' 4 (1.626 m)   Wt 189 lb (85.7 kg)   SpO2 98%   BMI 32.44 kg/m        Wt Readings from Last 3 Encounters:  05/03/24 189 lb (85.7 kg)  04/08/24 184 lb 8.4 oz (83.7 kg)  02/25/24 189 lb (85.7 kg)    GEN: Well nourished, well  developed in no acute distress NECK: No JVD; No carotid bruits CARDIAC: RRR, no murmurs, rubs, gallops; ICD implantation site healing well with approximated edges, scabbing noted to be edges on the right and the left, no open areas, no drainage, open to air RESPIRATORY:  Clear to auscultation without rales, wheezing or rhonchi  ABDOMEN: Soft, non-tender, non-distended EXTREMITIES:  No edema; No deformity   ASSESSMENT AND PLAN Out-of-hospital VT arrest with by-stander CPR and quick ROSC, he remained neurologically intact/VT/ s/pICD.  EMS provided ECG tracings with nonsustained V. tach around 175 bpm noted.  He underwent left heart catheterization and then cardiac MRI with stable coronary disease that showed scar and he had ICD implantation 05/12/2024 for primary prevention of sudden cardiac death.  EKG today reveals he is a-paced at 50 bpm.  He is having an increased amount of fatigue.  His metoprolol  succinate was decreased from 50 mg daily to 37.5 mg daily with a new prescription sent into the pharmacy of choice.  He is to maintain follow-ups with device clinic, continue with remote interrogations of his device, and to follow-up with Dr. Cindie as scheduled.  Patient has been reminded on left arm restrictions, activities of no chopping wood, using a chainsaw, using a weedeater, using a backpack blower at this time.  He has also been advised at this time still no driving per [employer]  [employer]  standards.  Coronary artery disease status post prior CABG. left heart catheterization completed on 04/07/2024 by Dr. Ladona showed no new changes in coronary anatomy from his previous 2014 cath.  He had widely patent grafts.  No change in his LVEF with inferior wall scar.  He has been continued on aspirin  81 mg daily and Pravachol  40 mg daily.  EKG today reveals no ischemic changes.  Cardiac MRI revealed scar with an EF of 45%.  He has been sent for updated labs of a CBC and a CMP today posthospitalization.  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  He is continued on metoprolol  XL.  We did discuss escalation of GDMT.  Changes to medication deferred other than dose adjustments of Toprol -XL today he is also continued on irbesartan  150 mg daily.  He has been scheduled for limited echocardiogram in 6 weeks from his event to reassess LVEF.  At that time if continued moderately reduced EF can consider initiation of GDMT which patient and his wife are in agreement with.  Paroxysmal atrial fibrillation noted on device remote upload.  Reached out to EP team today about his CHA2DS2-VASc score of 3 and 2 hours of known atrial fibrillation.  EP team stated not to anticoagulate as patient has not had a CVA or TIA as his 2 hours of atrial fibrillation is considered subclinical.  They will continue to monitor and watchful wait and if he has an increased burden of atrial fibrillation on device monitoring they will consider starting OAC at that time.  Ongoing management per EP team.  Primary hypertension with a blood pressure 120/60.  He is continued on irbesartan  150 mg daily and Toprol -XL with reduced dose of 37.5 mg today.  Blood pressures are stable.  Encouraged to monitor pressures 1 to 2 hours postmedication administration as well.  Mixed hyperlipidemia with a last LDL 76.  Continued on pravastatin  40 mg daily.  Goal would be 70 or  less of his LDL discussed adding ezetimibe at return.  Habitual EtOH use where he  has been advised to abstain from alcohol.  He states he has only had 1 beer since discharge from the hospital.  COPD and asthma where he is continued on inhalers.  Ongoing management per pulmonary.       Dispo: Patient to return to clinic to see MD/APP at previously scheduled appointment after upcoming echocardiogram or sooner if needed.  Paperwork for handicap placard signed with original given to patient's wife today.  Patient is being sent for follow-up labs as he had electrolyte derangements noted during recent hospitalization as well.  Signed, Niaja Stickley, NP

## 2024-05-04 ENCOUNTER — Ambulatory Visit: Payer: Self-pay | Admitting: Cardiology

## 2024-05-04 DIAGNOSIS — E782 Mixed hyperlipidemia: Secondary | ICD-10-CM

## 2024-05-04 LAB — CBC
Hematocrit: 42.9 % (ref 37.5–51.0)
Hemoglobin: 14.1 g/dL (ref 13.0–17.7)
MCH: 31.1 pg (ref 26.6–33.0)
MCHC: 32.9 g/dL (ref 31.5–35.7)
MCV: 95 fL (ref 79–97)
Platelets: 231 x10E3/uL (ref 150–450)
RBC: 4.53 x10E6/uL (ref 4.14–5.80)
RDW: 12.8 % (ref 11.6–15.4)
WBC: 13.1 x10E3/uL — ABNORMAL HIGH (ref 3.4–10.8)

## 2024-05-04 LAB — COMPREHENSIVE METABOLIC PANEL WITH GFR
ALT: 20 IU/L (ref 0–44)
AST: 16 IU/L (ref 0–40)
Albumin: 4.5 g/dL (ref 3.9–4.9)
Alkaline Phosphatase: 171 IU/L — ABNORMAL HIGH (ref 44–121)
BUN/Creatinine Ratio: 13 (ref 10–24)
BUN: 10 mg/dL (ref 8–27)
Bilirubin Total: 0.3 mg/dL (ref 0.0–1.2)
CO2: 23 mmol/L (ref 20–29)
Calcium: 9.4 mg/dL (ref 8.6–10.2)
Chloride: 102 mmol/L (ref 96–106)
Creatinine, Ser: 0.76 mg/dL (ref 0.76–1.27)
Globulin, Total: 2.2 g/dL (ref 1.5–4.5)
Glucose: 75 mg/dL (ref 70–99)
Potassium: 4.4 mmol/L (ref 3.5–5.2)
Sodium: 142 mmol/L (ref 134–144)
Total Protein: 6.7 g/dL (ref 6.0–8.5)
eGFR: 99 mL/min/1.73 (ref >59)

## 2024-05-04 NOTE — Progress Notes (Signed)
 Chronically elevated white count that is slowly decreasing.  Liver functions are increased.  Recommend refraining from alcohol, fried, and fatty foods.  Recommend repeating hepatic panel in 3 weeks.  Remainder of the labs have remained stable.

## 2024-05-17 ENCOUNTER — Telehealth: Payer: Self-pay | Admitting: Cardiovascular Disease

## 2024-05-17 NOTE — Telephone Encounter (Signed)
 Pt c/o medication issue:  1. Name of Medication: metoprolol  succinate (TOPROL -XL) 25 MG 24 hr tablet   2. How are you currently taking this medication (dosage and times per day)? take 1.5 tablets (37.25 mg total) by mouth daily.   3. Are you having a reaction (difficulty breathing--STAT)?   4. What is your medication issue? Wife states he is very tired being on this medication. He took it in the past and had the same issue.

## 2024-05-17 NOTE — Telephone Encounter (Signed)
 Returned call to pt and his wife; pt is concerned because he is exhausted and does very little activity; he states that he felt this way years ago when he had taken Metoprolol  before. I explained that it could take 2-4 weeks for him to adjust to taking Beta Blocker and with him having a cardiac arrest it may take him longer to recover.  When he saw Tylene Lunch, NP in early September she reduced his dosage from 50 mg to 37.5 mg, but he continues to require 2 hour naps in the afternoon and his heart rate remains in the low 50's;  His most recent vital signs BP 140/70; HR 43 O2 94%;  pt states he wants to take medication that he needs, but wants to regain some energy;  he states he can do very little and become tired; I told them that I would forward their concerns to Tylene Lunch, NP for further advise and adjustments if needed; pt's wife also asked if he should continue to take statins since his last blood draw results showed increase liver enzymes.  I explained to pt to stay on the statins and have the follow up labs so that Tylene Lunch, NP could determine if statins were therapeutic or if she needed to change to a different cholesterol medication;  pt and his wife verbalized understanding and thanked me for my return call.

## 2024-05-17 NOTE — Telephone Encounter (Signed)
 It is reasonable to reduce the Toprol  to 25 mg daily. As far as continuing the statin and elevated labs, there was only one result that was elevated, the statin is likely not the cause of his symptoms or elevated results. We can reduce the dosing from daily to three times a week and determine the effectiveness with next lab collection.

## 2024-05-25 ENCOUNTER — Encounter: Payer: Self-pay | Admitting: Cardiology

## 2024-05-25 ENCOUNTER — Other Ambulatory Visit: Payer: Self-pay

## 2024-05-25 ENCOUNTER — Ambulatory Visit: Admitting: Pulmonary Disease

## 2024-05-25 ENCOUNTER — Ambulatory Visit (INDEPENDENT_AMBULATORY_CARE_PROVIDER_SITE_OTHER)

## 2024-05-25 DIAGNOSIS — E782 Mixed hyperlipidemia: Secondary | ICD-10-CM

## 2024-05-25 DIAGNOSIS — I480 Paroxysmal atrial fibrillation: Secondary | ICD-10-CM | POA: Diagnosis not present

## 2024-05-25 NOTE — Telephone Encounter (Signed)
 error

## 2024-05-26 ENCOUNTER — Ambulatory Visit: Payer: Self-pay | Admitting: Cardiology

## 2024-05-26 LAB — HEPATIC FUNCTION PANEL
ALT: 20 IU/L (ref 0–44)
AST: 17 IU/L (ref 0–40)
Albumin: 4.3 g/dL (ref 3.9–4.9)
Alkaline Phosphatase: 143 IU/L — ABNORMAL HIGH (ref 47–123)
Bilirubin Total: 0.4 mg/dL (ref 0.0–1.2)
Bilirubin, Direct: 0.14 mg/dL (ref 0.00–0.40)
Total Protein: 6.4 g/dL (ref 6.0–8.5)

## 2024-05-26 LAB — LIPID PANEL
Chol/HDL Ratio: 2.8 ratio (ref 0.0–5.0)
Cholesterol, Total: 144 mg/dL (ref 100–199)
HDL: 51 mg/dL (ref >39)
LDL Chol Calc (NIH): 76 mg/dL (ref 0–99)
Triglycerides: 91 mg/dL (ref 0–149)
VLDL Cholesterol Cal: 17 mg/dL (ref 5–40)

## 2024-05-26 NOTE — Progress Notes (Signed)
 Liver functions have slightly improved.  Continue current medication regimen without changes at this time.  Forward results to PCP for further recommendations for elevated alkaline phos.

## 2024-05-27 ENCOUNTER — Ambulatory Visit: Admitting: Pulmonary Disease

## 2024-05-27 ENCOUNTER — Ambulatory Visit: Payer: Self-pay | Admitting: Cardiology

## 2024-05-27 LAB — CUP PACEART REMOTE DEVICE CHECK
Battery Remaining Longevity: 156 mo
Battery Remaining Percentage: 100 %
Brady Statistic RA Percent Paced: 41 %
Brady Statistic RV Percent Paced: 0 %
Date Time Interrogation Session: 20251001023000
HighPow Impedance: 76 Ohm
Implantable Lead Connection Status: 753985
Implantable Lead Connection Status: 753985
Implantable Lead Implant Date: 20250818
Implantable Lead Implant Date: 20250818
Implantable Lead Location: 753859
Implantable Lead Location: 753860
Implantable Lead Model: 672
Implantable Lead Model: 7841
Implantable Lead Serial Number: 1644910
Implantable Lead Serial Number: 290861
Implantable Pulse Generator Implant Date: 20250818
Lead Channel Impedance Value: 375 Ohm
Lead Channel Impedance Value: 597 Ohm
Lead Channel Pacing Threshold Amplitude: 0.4 V
Lead Channel Pacing Threshold Amplitude: 0.7 V
Lead Channel Pacing Threshold Pulse Width: 0.4 ms
Lead Channel Pacing Threshold Pulse Width: 0.4 ms
Lead Channel Setting Pacing Amplitude: 3.5 V
Lead Channel Setting Pacing Amplitude: 3.5 V
Lead Channel Setting Pacing Pulse Width: 0.4 ms
Lead Channel Setting Sensing Sensitivity: 0.5 mV
Pulse Gen Serial Number: 708605

## 2024-05-27 NOTE — Progress Notes (Signed)
 Remote ICD Transmission

## 2024-05-31 ENCOUNTER — Ambulatory Visit: Admitting: Pulmonary Disease

## 2024-05-31 ENCOUNTER — Encounter: Payer: Self-pay | Admitting: Pulmonary Disease

## 2024-05-31 VITALS — BP 136/66 | HR 57 | Temp 97.6°F | Ht 64.0 in | Wt 193.8 lb

## 2024-05-31 DIAGNOSIS — J4489 Other specified chronic obstructive pulmonary disease: Secondary | ICD-10-CM

## 2024-05-31 DIAGNOSIS — J4550 Severe persistent asthma, uncomplicated: Secondary | ICD-10-CM | POA: Diagnosis not present

## 2024-05-31 DIAGNOSIS — G4733 Obstructive sleep apnea (adult) (pediatric): Secondary | ICD-10-CM | POA: Diagnosis not present

## 2024-05-31 DIAGNOSIS — Z87891 Personal history of nicotine dependence: Secondary | ICD-10-CM | POA: Diagnosis not present

## 2024-05-31 DIAGNOSIS — J4540 Moderate persistent asthma, uncomplicated: Secondary | ICD-10-CM

## 2024-05-31 MED ORDER — BREZTRI AEROSPHERE 160-9-4.8 MCG/ACT IN AERO: 2.0000 | INHALATION_SPRAY | Freq: Two times a day (BID) | RESPIRATORY_TRACT | 0 refills | Status: DC

## 2024-05-31 NOTE — Progress Notes (Unsigned)
 Subjective:    Patient ID: [name], male    DOB: Jul 15, 1956, 67 y.o.   MRN: [id number]  Patient Care Team: Orlando Dwayne NOVAK, FNP as PCP - General (Nurse Practitioner) Perla Evalene PARAS, MD as PCP - Cardiology (Cardiology) Cindie Ole DASEN, MD as PCP - Electrophysiology (Cardiology) Perla Evalene PARAS, MD as Consulting Physician (Cardiology) Gerard Frederick, NP as Nurse Practitioner (Cardiology)  Chief Complaint  Patient presents with   Asthma    No breathing problems.   Obstructive Sleep Apnea    Using CPAP every night. No problems with mask or pressure.    BACKGROUND/INTERVAL:  HPI   Review of Systems A 10 point review of systems was performed and it is as noted above otherwise negative.   Patient Active Problem List   Diagnosis Date Noted   Cardiac arrest with ventricular fibrillation (HCC) 04/07/2024   Diaphoresis 08/03/2018   COPD exacerbation (HCC) 12/31/2015   SOB (shortness of breath) 12/31/2015   Hypokalemia 12/31/2015   Left foot pain 03/28/2013   Erectile dysfunction 01/28/2013   DIZZINESS 05/09/2010   DIAPHORESIS 05/09/2010   CHEST PAIN 05/09/2010   Hyperlipidemia 01/04/2010   HYPERTENSION, BENIGN 01/04/2010   CAD, ARTERY BYPASS GRAFT 01/04/2010    Social History   Tobacco Use   Smoking status: Former    Current packs/day: 0.00    Average packs/day: 1 pack/day for 21.0 years (21.0 ttl pk-yrs)    Types: Cigarettes    Start date: 03/20/1981    Quit date: 03/20/2002    Years since quitting: 22.2   Smokeless tobacco: Never   Tobacco comments:    Tobacco use - no  Substance Use Topics   Alcohol use: Yes    Alcohol/week: 2.0 standard drinks of alcohol    Types: 2 Cans of beer per week    Comment: daily (most days)    Allergies  Allergen Reactions   Heparin (Bovine) Anaphylaxis    Other reaction(s): Shock (ALLERGY)  ?Coma  Other reaction(s): Shock (ALLERGY) ?Coma ?Coma Other reaction(s): Shock (ALLERGY) ?Coma    ?Coma   Codeine  Other (See Comments)    Breaks out into a sweat & rash  Reaction: Sweating (intolerance); Breaks out into a sweat & rash   Heparin Other (See Comments)    Respiratory failure  Other Reaction(s): respiratory failure   Penicillin G Other (See Comments)   Penicillins Other (See Comments) and Rash    Other reaction(s): Diaphoresis / Sweating (intolerance)  Has patient had a PCN reaction causing immediate rash, facial/tongue/throat swelling, SOB or lightheadedness with hypotension: Yes  Has patient had a PCN reaction causing severe rash involving mucus membranes or skin necrosis: No  Has patient had a PCN reaction that required hospitalization No  Has patient had a PCN reaction occurring within the last 10 years: No  If all of the above answers are NO, then may proceed with Cephalosporin use.  Other Reaction(s): sweating  Reaction: Diaphoresis / Sweating (intolerance)   Other reaction(s): Diaphoresis / Sweating (intolerance)  Has patient had a PCN reaction causing immediate rash, facial/tongue/throat swelling, SOB or lightheadedness with hypotension: Yes  Has patient had a PCN reaction causing severe rash involving mucus membranes or skin necrosis: No  Has patient had a PCN reaction that required hospitalization No  Has patient had a PCN reaction occurring within the last 10 years: No  If all of the above answers are NO, then may proceed with Cephalosporin use.    Reaction: Diaphoresis / Sweating (intolerance)  Tape Itching and Rash   Wound Dressing Adhesive Itching and Rash    Current Meds  Medication Sig   albuterol  (VENTOLIN  HFA) 108 (90 Base) MCG/ACT inhaler Inhale 2 puffs into the lungs.   aspirin  81 MG EC tablet Take 1 tablet (81 mg total) by mouth every morning.   budesonide -glycopyrrolate -formoterol  (BREZTRI  AEROSPHERE) 160-9-4.8 MCG/ACT AERO inhaler Inhale 2 puffs into the lungs in the morning and at bedtime.   irbesartan  (AVAPRO ) 150 MG tablet TAKE 1 TABLET BY MOUTH  EVERY DAY   metoprolol  succinate (TOPROL -XL) 25 MG 24 hr tablet Take 1.5 tablets (37.25 mg total) by mouth daily. Take with or immediately following a meal.   nitroGLYCERIN  (NITROSTAT ) 0.4 MG SL tablet Place 1 tablet (0.4 mg total) under the tongue every 5 (five) minutes as needed for chest pain.   pravastatin  (PRAVACHOL ) 40 MG tablet TAKE 1 TABLET BY MOUTH EVERYDAY AT BEDTIME   tadalafil  (CIALIS ) 20 MG tablet Take 20 mg by mouth daily as needed.    Immunization History  Administered Date(s) Administered   Influenza, Quadrivalent, Recombinant, Inj, Pf 06/03/2018   Influenza-Unspecified 06/09/2023   Janssen (J&J) SARS-COV-2 Vaccination 12/02/2019   Tdap 06/09/2013        Objective:     BP 136/66   Pulse (!) 57   Temp 97.6 F (36.4 C) (Temporal)   Ht 5' 4 (1.626 m)   Wt 193 lb 12.8 oz (87.9 kg)   SpO2 96%   BMI 33.27 kg/m   SpO2: 96 %  GENERAL: HEAD: Normocephalic, atraumatic.  EYES: Pupils equal, round, reactive to light.  No scleral icterus.  MOUTH:  NECK: Supple. No thyromegaly. Trachea midline. No JVD.  No adenopathy. PULMONARY: Good air entry bilaterally.  No adventitious sounds. CARDIOVASCULAR: S1 and S2. Regular rate and rhythm.  ABDOMEN: MUSCULOSKELETAL: No joint deformity, no clubbing, no edema.  NEUROLOGIC:  SKIN: Intact,warm,dry. PSYCH:        Assessment & Plan:   No diagnosis found.  No orders of the defined types were placed in this encounter.   No orders of the defined types were placed in this encounter.     Advised if symptoms do not improve or worsen, to please contact office for sooner follow up or seek emergency care.    I spent xxx minutes of dedicated to the care of this patient on the date of this encounter to include pre-visit review of records, face-to-face time with the patient discussing conditions above, post visit ordering of testing, clinical documentation with the electronic health record, making appropriate referrals as  documented, and communicating necessary findings to members of the patients care team.     C. Leita Sanders, MD Advanced Bronchoscopy PCCM [facility]    *This note was generated using voice recognition software/Dragon and/or AI transcription program.  Despite best efforts to proofread, errors can occur which can change the meaning. Any transcriptional errors that result from this process are unintentional and may not be fully corrected at the time of dictation.

## 2024-05-31 NOTE — Patient Instructions (Signed)
 VISIT SUMMARY:  Today, we discussed the management of your obstructive sleep apnea and chronic obstructive pulmonary disease (COPD). We reviewed your current treatments and made some adjustments to help improve your symptoms and overall health.  YOUR PLAN:  -OBSTRUCTIVE SLEEP APNEA: Obstructive sleep apnea is a condition where your airway becomes blocked during sleep, causing breathing pauses. We will refer you to a sleep specialist, Dr. Jess, to optimize your CPAP settings. It's important to use your CPAP machine during naps as well. Continue using the nasal pillows and chin strap to improve your comfort and usage.  -CHRONIC OBSTRUCTIVE PULMONARY DISEASE (COPD): COPD is a chronic lung condition that makes it hard to breathe. To help manage your symptoms, make sure to rinse your mouth after using your inhaler to prevent any oral side effects.  INSTRUCTIONS:  Please follow up with Dr. Jess, the sleep specialist, for CPAP optimization. Continue using your CPAP machine as instructed and remember to rinse your mouth after using your inhaler. You are also awaiting a follow-up echocardiogram for your heart condition.

## 2024-06-13 ENCOUNTER — Ambulatory Visit: Admitting: Sleep Medicine

## 2024-06-13 ENCOUNTER — Encounter: Payer: Self-pay | Admitting: Sleep Medicine

## 2024-06-13 VITALS — BP 112/70 | HR 58 | Temp 98.3°F | Ht 64.0 in | Wt 196.2 lb

## 2024-06-13 DIAGNOSIS — I10 Essential (primary) hypertension: Secondary | ICD-10-CM

## 2024-06-13 DIAGNOSIS — G4733 Obstructive sleep apnea (adult) (pediatric): Secondary | ICD-10-CM

## 2024-06-13 NOTE — Patient Instructions (Addendum)

## 2024-06-13 NOTE — Progress Notes (Signed)
 Name:[name] MRN: [id number] DOB: [birth year]   CHIEF COMPLAINT:  ESTABLISH CARE FOR OSA   HISTORY OF PRESENT ILLNESS: Mr. Kaluzny is a 68 y.o. w/ a h/o CAD s/p AICD, HTN, hyperlipidemia and obesity who presents to establish care for OSA. Patient was diagnosed with sleep apnea a few months ago and was subsequently started on CPAP therapy. Reports using CPAP therapy every night, which is confirmed by compliance data. He is currently using the Airfit N30i nasal mask. Reports nocturnal awakenings due to mask leaks. Reports weight changes. Admits to dry mouth. Denies morning headaches, RLS symptoms, dream enactment, cataplexy, hypnagogic or hypnapompic hallucinations. Denies a family history of sleep apnea. Denies drowsy driving. Denies alcohol, tobacco or illicit drug use.   Bedtime 10-11 pm Sleep onset 10 mins Rise time 6:30-7:30 am   EPWORTH SLEEP SCORE 6    06/13/2024   10:00 AM  Results of the Epworth flowsheet  Sitting and reading 1  Watching TV 1  Sitting, inactive in a public place (e.g. a theatre or a meeting) 0  As a passenger in a car for an hour without a break 0  Lying down to rest in the afternoon when circumstances permit 3  Sitting and talking to someone 0  Sitting quietly after a lunch without alcohol 1  In a car, while stopped for a few minutes in traffic 0  Total score 6    PAST MEDICAL HISTORY :   has a past medical history of CAD (coronary artery disease), CHF (congestive heart failure) (HCC), Chronic fatigue, Complication of anesthesia, COPD (chronic obstructive pulmonary disease) (HCC), Dyspnea, HLD (hyperlipidemia), HTN (hypertension), Leukocytosis, Myocardial infarction (HCC), Pneumonia, Vertigo, and Wears dentures.  has a past surgical history that includes Neck surgery; Appendectomy; Coronary artery bypass graft (02/2002); Cataract extraction w/PHACO (Right, 03/20/2020); Appendectomy; Hernia repair; Colonoscopy; Cataract extraction w/PHACO (Left,  04/10/2020); LEFT HEART CATH AND CORS/GRAFTS ANGIOGRAPHY (N/A, 04/07/2024); and ICD IMPLANT (N/A, 04/11/2024). Prior to Admission medications   Medication Sig Start Date End Date Taking? Authorizing Provider  albuterol  (VENTOLIN  HFA) 108 (90 Base) MCG/ACT inhaler Inhale 2 puffs into the lungs.   Yes [provider]  ALPRAZolam (XANAX) 0.25 MG tablet Take 0.25 mg by mouth at bedtime as needed for anxiety or sleep. 06/02/24  Yes [provider]  aspirin  81 MG EC tablet Take 1 tablet (81 mg total) by mouth every morning. 12/02/21  Yes Gollan, Timothy J, MD  budesonide -glycopyrrolate -formoterol  (BREZTRI  AEROSPHERE) 160-9-4.8 MCG/ACT AERO inhaler Inhale 2 puffs into the lungs in the morning and at bedtime. 02/25/24  Yes Tamea Dedra CROME, MD  budesonide -glycopyrrolate -formoterol  (BREZTRI  AEROSPHERE) 160-9-4.8 MCG/ACT AERO inhaler Inhale 2 puffs into the lungs in the morning and at bedtime. 05/31/24  Yes Tamea Dedra CROME, MD  irbesartan  (AVAPRO ) 150 MG tablet TAKE 1 TABLET BY MOUTH EVERY DAY 01/12/24  Yes Gollan, Timothy J, MD  metoprolol  succinate (TOPROL -XL) 25 MG 24 hr tablet Take 1.5 tablets (37.25 mg total) by mouth daily. Take with or immediately following a meal. 05/03/24 05/03/25 Yes Hammock, Sheri, NP  nitroGLYCERIN  (NITROSTAT ) 0.4 MG SL tablet Place 1 tablet (0.4 mg total) under the tongue every 5 (five) minutes as needed for chest pain. 08/04/23  Yes Gollan, Timothy J, MD  pravastatin  (PRAVACHOL ) 40 MG tablet TAKE 1 TABLET BY MOUTH EVERYDAY AT BEDTIME 03/31/24  Yes Gollan, Timothy J, MD  tadalafil  (CIALIS ) 20 MG tablet Take 20 mg by mouth daily as needed. 09/26/21  Yes [provider]   Allergies  Allergen Reactions   Heparin (Bovine) Anaphylaxis    Other reaction(s): Shock (ALLERGY)  ?Coma  Other reaction(s): Shock (ALLERGY) ?Coma ?Coma Other reaction(s): Shock (ALLERGY) ?Coma    ?Coma   Codeine Other (See Comments)    Breaks out into a sweat & rash  Reaction: Sweating  (intolerance); Breaks out into a sweat & rash   Heparin Other (See Comments)    Respiratory failure  Other Reaction(s): respiratory failure   Penicillin G Other (See Comments)   Penicillins Other (See Comments) and Rash    Other reaction(s): Diaphoresis / Sweating (intolerance)  Has patient had a PCN reaction causing immediate rash, facial/tongue/throat swelling, SOB or lightheadedness with hypotension: Yes  Has patient had a PCN reaction causing severe rash involving mucus membranes or skin necrosis: No  Has patient had a PCN reaction that required hospitalization No  Has patient had a PCN reaction occurring within the last 10 years: No  If all of the above answers are NO, then may proceed with Cephalosporin use.  Other Reaction(s): sweating  Reaction: Diaphoresis / Sweating (intolerance)   Other reaction(s): Diaphoresis / Sweating (intolerance)  Has patient had a PCN reaction causing immediate rash, facial/tongue/throat swelling, SOB or lightheadedness with hypotension: Yes  Has patient had a PCN reaction causing severe rash involving mucus membranes or skin necrosis: No  Has patient had a PCN reaction that required hospitalization No  Has patient had a PCN reaction occurring within the last 10 years: No  If all of the above answers are NO, then may proceed with Cephalosporin use.    Reaction: Diaphoresis / Sweating (intolerance)   Tape Itching and Rash   Wound Dressing Adhesive Itching and Rash    FAMILY HISTORY:  family history includes Heart attack (age of onset: 88) in his father; Heart attack (age of onset: 19) in his mother. SOCIAL HISTORY:  reports that he quit smoking about 22 years ago. His smoking use included cigarettes. He started smoking about 43 years ago. He has a 21 pack-year smoking history. He has never used smokeless tobacco. He reports current alcohol use of about 2.0 standard drinks of alcohol per week. He reports that he does not use drugs.   Review of  Systems:  Gen:  Denies  fever, sweats, chills weight loss  HEENT: Denies blurred vision, double vision, ear pain, eye pain, hearing loss, nose bleeds, sore throat Cardiac:  No dizziness, chest pain or heaviness, chest tightness,edema, No JVD Resp:   No cough, -sputum production, -shortness of breath,-wheezing, -hemoptysis,  Gi: Denies swallowing difficulty, stomach pain, nausea or vomiting, diarrhea, constipation, bowel incontinence Gu:  Denies bladder incontinence, burning urine Ext:   Denies Joint pain, stiffness or swelling Skin: Denies  skin rash, easy bruising or bleeding or hives Endoc:  Denies polyuria, polydipsia , polyphagia or weight change Psych:   Denies depression, insomnia or hallucinations  Other:  All other systems negative  VITAL SIGNS: BP 112/70   Pulse (!) 58   Temp 98.3 F (36.8 C)   Ht 5' 4 (1.626 m)   Wt 196 lb 3.2 oz (89 kg)   SpO2 96%   BMI 33.68 kg/m    Physical Examination:   General Appearance: No distress  EYES PERRLA, EOM intact.   NECK Supple, No JVD Pulmonary: normal breath sounds, No wheezing.  CardiovascularNormal S1,S2.  No m/r/g.   Abdomen: Benign, Soft, non-tender. Skin:   warm, no rashes, no ecchymosis  Extremities: normal, no cyanosis, clubbing. Neuro:without  focal findings,  speech normal  PSYCHIATRIC: Mood, affect within normal limits.   ASSESSMENT AND PLAN  OSA Patient is using and benefiting from CPAP therapy. I suspect that elevated AHI is secondary to air leaks. Will try patient on the Airfit F40 FFM. Discussed the consequences of untreated sleep apnea. Advised not to drive drowsy for safety of patient and others. Will follow up in 3 months.    HTN Stable, on current management. Following with PCP.    Patient  satisfied with Plan of action and management. All questions answered  I spent a total of 31 minutes reviewing chart data, face-to-face evaluation with the patient, counseling and coordination of care as detailed  above.    Kilani Joffe, [name]  Sleep Medicine [facility]

## 2024-06-15 ENCOUNTER — Telehealth: Payer: Self-pay | Admitting: Internal Medicine

## 2024-06-15 ENCOUNTER — Ambulatory Visit: Attending: Cardiology

## 2024-06-15 DIAGNOSIS — I4901 Ventricular fibrillation: Secondary | ICD-10-CM | POA: Diagnosis not present

## 2024-06-15 DIAGNOSIS — I469 Cardiac arrest, cause unspecified: Secondary | ICD-10-CM | POA: Insufficient documentation

## 2024-06-15 LAB — ECHOCARDIOGRAM LIMITED
AV Mean grad: 3 mmHg
AV Peak grad: 6.2 mmHg
Ao pk vel: 1.24 m/s
Area-P 1/2: 4.96 cm2
S' Lateral: 4.92 cm

## 2024-06-15 NOTE — Telephone Encounter (Signed)
 Sent mychart message advising no further arm restrictions.

## 2024-06-15 NOTE — Telephone Encounter (Signed)
 Patient is here & wants to know his limitations after device place, please assist.

## 2024-06-16 ENCOUNTER — Ambulatory Visit: Admitting: Pulmonary Disease

## 2024-07-11 ENCOUNTER — Ambulatory Visit: Admitting: Cardiovascular Disease

## 2024-07-25 ENCOUNTER — Other Ambulatory Visit: Payer: Self-pay | Admitting: Cardiovascular Disease

## 2024-07-25 DIAGNOSIS — I10 Essential (primary) hypertension: Secondary | ICD-10-CM

## 2024-07-25 DIAGNOSIS — I5022 Chronic systolic (congestive) heart failure: Secondary | ICD-10-CM

## 2024-07-25 DIAGNOSIS — I469 Cardiac arrest, cause unspecified: Secondary | ICD-10-CM

## 2024-07-25 DIAGNOSIS — I480 Paroxysmal atrial fibrillation: Secondary | ICD-10-CM

## 2024-07-25 DIAGNOSIS — E782 Mixed hyperlipidemia: Secondary | ICD-10-CM

## 2024-07-25 NOTE — Progress Notes (Unsigned)
 Cardiology Office Note  Date:  07/26/2024   ID:  Lavontae, Cornia 04/22/1956, MRN [id number]  PCP:  Orlando Dwayne NOVAK, FNP   Chief Complaint  Patient presents with   12 month follow up     Patient c/o weight gain since 03/2024.    HPI:  68 year old gentleman with a history of  three vessel CAD,  CABG 01/2002, LIMA to the LAD, vein graft to the diagonal, vein graft to the OM and vein graft to the distal RCA.  cath 2014 showing patent grafts severe three-vessel disease. hyperlipidemia  hypertension,  smoking history for 23 years who stopped in 2003,   Chronic leukocytosis  followed by oncology  At [facility]  who presents for routine follow up of his coronary artery disease.   LOV 12/24  Was working in the yard with chain saw Out-of-hospital cardiac arrest 04/07/24 Wife complex tachycardia EMS arrived, wife did CPR before rescue arrived EMS strip with wide-complex tachycardia Cardiac cath :No change in coronary anatomy from 2014, no intervention was needed Echo EF 40-45%, ICD place Repeat echo October 2025 EF remained 40 to 45%,   Cardiac MRI EF 45% LGE pattern in the basal inferoseptal, basal to mid inferior, and apical lateral walls suggests prior myocardial infarction in those areas.   Has not started his full activities, weight has been trending upwards No regular exercise program,   Retired past 5 years ago No recent near-syncope or syncope but does have a history  hobbies involve buying and reselling books, lots of outside chores  Lab work reviewed Total cholesterol 144 LDL 76 Normal CMP elevated alkaline phosphatase  EKG personally reviewed by myself on todays visit EKG Interpretation Date/Time:  Tuesday July 26 2024 11:24:33 EST Ventricular Rate:  57 PR Interval:  162 QRS Duration:  94 QT Interval:  434 QTC Calculation: 422 R Axis:   2  Text Interpretation: Sinus bradycardia Inferior infarct (cited on or before 03-May-2024) Anteroseptal infarct  (cited on or before 03-May-2024) When compared with ECG of 03-May-2024 13:45, Sinus rhythm has replaced Electronic atrial pacemaker Confirmed by Perla Lye [phone]) on 07/26/2024 11:34:09 AM    Imaging studies reviewed with him on today's visit Echo 10/23 Low normal ejection fraction 50 to 55%, improved from prior study in 2017 Diastolic dysfunction noted Right ventricular function No significant valvular heart disease  Zio monitor Patient had a min HR of 43 bpm 03:48am, 10/18 , max HR of 184 bpm, and avg HR of 63  bpm. Predominant underlying rhythm was Sinus Rhythm. 49  Supraventricular Tachycardia runs occurred, the run with the fastest  interval lasting 5 beats with a max rate of 184 bpm, the longest lasting  17.7 secs with an avg rate of 99 bpm. Isolated SVEs were rare (<1.0%),  SVE Couplets were rare (<1.0%), and SVE Triplets were rare (<1.0%).  Isolated VEs were occasional (2.5%, 25300), VE Couplets were rare  (<1.0%, 333), and VE Triplets were rare (<1.0%, 19). Ventricular Bigeminy  and Trigeminy were present.   Number of Triggered Events and/or Diary Events: 4 triggered, 1 diary may faint, Sweating, lightheaded, dizziness  Findings within  45 sec of Triggers and/or associated with Diary Events:  sinus rhythm 57-81 bpm with occasional ectopy   Conclusions:  - Patient triggered/diary events corresponded to  sinus rhythm 57-81 bpm with occasional ectopy. This includes an episode 06/03/22 11:45am may faint, Sweating, lightheaded, dizziness during which sinus rhythm at 69bpm was recorded  -  49 SVT episodes, the longest of  which lasted 17.7 seconds. No triggered/diary events were associated with SVT  -  Occasional PVC's, 2.5% overall burden   Seen in the ER at [facility] June 01, 2022 for near syncope Was sitting in a chair at home, felt warm, diaphoretic, dizzy Per wife he looked pale Blood pressure was low at home, EMS called Vitals normal per EMS Symptoms lasted 1 hour,  felt normal by the time he reached the emergency room Similar episode 2 weeks earlier, at the time heart rate was 130 bpm Work-up in the ER negative, was discharged with Zio patch Troponin negative, EKG with normal sinus rhythm CTA head with no acute findings  other past medical history works 10 hours per day, 4 days per week  In heavy machinery erectile dysfunction symptoms He is not on testosterone  supplement as he was previously.  Reports that oncology does not want his testosterone  high    previously reported some cramping in his legs , unclear if this is from pravastatin  In the past he had frequent episodes of sweating, malaise/weakness initially felt to be secondary to bradycardia from metoprolol    previous 30 day monitor in October 2015 given his continued symptoms of sweating, malaise, weakness. Monitor did not show any significant arrhythmia. No significant bradycardia noted    episode 03/28/2014 where he was working with his tools, sitting, started sweating all over. This happened at 9 AM. His work colleagues sat him in a chair and gave him something to drink. He felt weak for 3 days   catheterization at the end of 2014 for anginal symptoms showing patent grafts severe three-vessel disease. Medical management was recommended.   In recovery following a cardiac catheterization, he had episodes of sweating/flushing, malaise associated with bradycardia. Initially it was felt that this was a vagal response to pressure being applied to his groin. Later in recovery, he continued to have bradycardia. He was told to hold his metoprolol  at the time of discharge. lushing, dizziness, malaise that he was having at home prior to the procedure resolved.  PMH:   has a past medical history of CAD (coronary artery disease), CHF (congestive heart failure) (HCC), Chronic fatigue, Complication of anesthesia, COPD (chronic obstructive pulmonary disease) (HCC), Dyspnea, HLD (hyperlipidemia), HTN  (hypertension), Leukocytosis, Myocardial infarction (HCC), Pneumonia, Vertigo, and Wears dentures.  PSH:    Past Surgical History:  Procedure Laterality Date   APPENDECTOMY     APPENDECTOMY     CATARACT EXTRACTION W/PHACO Right 03/20/2020   Procedure: CATARACT EXTRACTION PHACO AND INTRAOCULAR LENS PLACEMENT (IOC) RIGHT;  Surgeon: Jaye Fallow, MD;  Location: [facility];  Service: Ophthalmology;  Laterality: Right;  4.86 0:30.2   CATARACT EXTRACTION W/PHACO Left 04/10/2020   Procedure: CATARACT EXTRACTION PHACO AND INTRAOCULAR LENS PLACEMENT (IOC) LEFT 4.99 00:27.6;  Surgeon: Jaye Fallow, MD;  Location: [facility];  Service: Ophthalmology;  Laterality: Left;   COLONOSCOPY     CORONARY ARTERY BYPASS GRAFT  02/2002   4 vessel   HERNIA REPAIR     x 2   ICD IMPLANT N/A 04/11/2024   Procedure: ICD IMPLANT;  Surgeon: Kennyth Chew, MD;  Location: [facility] INVASIVE CV LAB;  Service: Cardiovascular;  Laterality: N/A;   LEFT HEART CATH AND CORS/GRAFTS ANGIOGRAPHY N/A 04/07/2024   Procedure: LEFT HEART CATH AND CORS/GRAFTS ANGIOGRAPHY;  Surgeon: Ladona Heinz, MD;  Location: [facility] INVASIVE CV LAB;  Service: Cardiovascular;  Laterality: N/A;   NECK SURGERY     cervical fusion    Current Outpatient Medications  Medication Sig  Dispense Refill   albuterol  (VENTOLIN  HFA) 108 (90 Base) MCG/ACT inhaler Inhale 2 puffs into the lungs.     ALPRAZolam (XANAX) 0.25 MG tablet Take 0.25 mg by mouth at bedtime as needed for anxiety or sleep.     aspirin  81 MG EC tablet Take 1 tablet (81 mg total) by mouth every morning. 90 tablet 3   budesonide -glycopyrrolate -formoterol  (BREZTRI  AEROSPHERE) 160-9-4.8 MCG/ACT AERO inhaler Inhale 2 puffs into the lungs in the morning and at bedtime. 2 each    irbesartan  (AVAPRO ) 150 MG tablet TAKE 1 TABLET BY MOUTH EVERY DAY 90 tablet 1   metoprolol  succinate (TOPROL -XL) 25 MG 24 hr tablet Take 25 mg by mouth daily.     nitroGLYCERIN  (NITROSTAT ) 0.4 MG SL tablet  Place 1 tablet (0.4 mg total) under the tongue every 5 (five) minutes as needed for chest pain. 25 tablet 1   spironolactone (ALDACTONE) 25 MG tablet Take 1 tablet (25 mg total) by mouth daily. 90 tablet 3   tadalafil  (CIALIS ) 20 MG tablet Take 20 mg by mouth daily as needed.     budesonide -glycopyrrolate -formoterol  (BREZTRI  AEROSPHERE) 160-9-4.8 MCG/ACT AERO inhaler Inhale 2 puffs into the lungs in the morning and at bedtime. (Patient not taking: Reported on 07/26/2024) 2 each 0   pravastatin  (PRAVACHOL ) 40 MG tablet Take 1 tablet (40 mg total) by mouth daily. 90 tablet 3   No current facility-administered medications for this visit.   Allergies:   Heparin (bovine), Codeine, Heparin, Penicillin g, Penicillins, Tape, and Wound dressing adhesive   Social History:  The patient  reports that he quit smoking about 22 years ago. His smoking use included cigarettes. He started smoking about 43 years ago. He has a 21 pack-year smoking history. He has never used smokeless tobacco. He reports that he does not currently use alcohol after a past usage of about 2.0 standard drinks of alcohol per week. He reports that he does not use drugs.   Family History:   family history includes Heart attack (age of onset: 48) in his father; Heart attack (age of onset: 18) in his mother.    Review of Systems: Review of Systems  Constitutional: Negative.   HENT: Negative.    Respiratory: Negative.    Cardiovascular: Negative.   Gastrointestinal: Negative.   Musculoskeletal: Negative.   Neurological: Negative.   Psychiatric/Behavioral: Negative.    All other systems reviewed and are negative.  PHYSICAL EXAM: VS:  BP (!) 144/72 (BP Location: Left Arm, Patient Position: Sitting, Cuff Size: Normal)   Pulse (!) 57   Ht 5' 4 (1.626 m)   Wt 198 lb 8 oz (90 kg)   SpO2 95%   BMI 34.07 kg/m  , BMI Body mass index is 34.07 kg/m. Constitutional:  oriented to person, place, and time. No distress.  HENT:  Head:  Normocephalic and atraumatic.  Eyes:  no discharge. No scleral icterus.  Neck: Normal range of motion. Neck supple. No JVD present.  Cardiovascular: Normal rate, regular rhythm, normal heart sounds and intact distal pulses. Exam reveals no gallop and no friction rub. No edema No murmur heard. Pulmonary/Chest: Effort normal and breath sounds normal. No stridor. No respiratory distress.  no wheezes.  no rales.  no tenderness.  Abdominal: Soft.  no distension.  no tenderness.  Musculoskeletal: Normal range of motion.  no  tenderness or deformity.  Neurological:  normal muscle tone. Coordination normal. No atrophy Skin: Skin is warm and dry. No rash noted. not diaphoretic.  Psychiatric:  normal  mood and affect. behavior is normal. Thought content normal.   Recent Labs: 04/09/2024: Magnesium  2.1 05/03/2024: BUN 10; Creatinine, Ser 0.76; Hemoglobin 14.1; Platelets 231; Potassium 4.4; Sodium 142 05/25/2024: ALT 20    Lipid Panel Lab Results  Component Value Date   CHOL 144 05/25/2024   HDL 51 05/25/2024   LDLCALC 76 05/25/2024   TRIG 91 05/25/2024    Wt Readings from Last 3 Encounters:  07/26/24 198 lb 8 oz (90 kg)  06/13/24 196 lb 3.2 oz (89 kg)  05/31/24 193 lb 12.8 oz (87.9 kg)     ASSESSMENT AND PLAN:  Cardiac arrest, wide-complex tachycardia Out-of-hospital cardiac arrest wife gave CPR, resuscitated by EMS, cardiac catheterization performed, cardiac MRI, ICD placed Followed by  EP  Cardiomyopathy Fraction 40 to 45% following cardiac arrest Recommend he continue metoprolol  succinate 25 daily, irbesartan  150 daily Suggest he start spironolactone 25 daily  HYPERTENSION, BENIGN -  Blood pressure mildly elevated, suggest starting spironolactone 25 daily as above  Atherosclerosis of coronary artery bypass graft of native heart without angina pectoris History of CABG  Cardiac catheterization on recent hospital admission for out-of-hospital cardiac arrest, unchanged from prior  catheterization 2014  Mixed hyperlipidemia Recommend he restart pravastatin  daily not 3 days a week, he was concern for elevated LFTs  Chronic shortness of breath/COPD Prior smoking history Prior COVID History of flu September 2024, slow recovery Recommend exercise program, weight loss Recommend he consider cardiac rehab after out-of-hospital cardiac arrest   Orders Placed This Encounter  Procedures   EKG 12-Lead      Signed, Velinda Lunger, [name], Ph.D. 07/26/2024  [facility] [place], [place] [phone]

## 2024-07-26 ENCOUNTER — Ambulatory Visit: Attending: Cardiovascular Disease | Admitting: Cardiovascular Disease

## 2024-07-26 ENCOUNTER — Ambulatory Visit: Admitting: Cardiovascular Disease

## 2024-07-26 ENCOUNTER — Encounter: Payer: Self-pay | Admitting: Cardiovascular Disease

## 2024-07-26 ENCOUNTER — Encounter: Admitting: Cardiology

## 2024-07-26 VITALS — BP 144/72 | HR 57 | Ht 64.0 in | Wt 198.5 lb

## 2024-07-26 DIAGNOSIS — Z95810 Presence of automatic (implantable) cardiac defibrillator: Secondary | ICD-10-CM | POA: Insufficient documentation

## 2024-07-26 DIAGNOSIS — J449 Chronic obstructive pulmonary disease, unspecified: Secondary | ICD-10-CM | POA: Diagnosis present

## 2024-07-26 DIAGNOSIS — I469 Cardiac arrest, cause unspecified: Secondary | ICD-10-CM | POA: Diagnosis not present

## 2024-07-26 DIAGNOSIS — I25708 Atherosclerosis of coronary artery bypass graft(s), unspecified, with other forms of angina pectoris: Secondary | ICD-10-CM | POA: Diagnosis present

## 2024-07-26 DIAGNOSIS — I472 Ventricular tachycardia, unspecified: Secondary | ICD-10-CM | POA: Diagnosis present

## 2024-07-26 DIAGNOSIS — E782 Mixed hyperlipidemia: Secondary | ICD-10-CM | POA: Insufficient documentation

## 2024-07-26 DIAGNOSIS — I4901 Ventricular fibrillation: Secondary | ICD-10-CM | POA: Insufficient documentation

## 2024-07-26 DIAGNOSIS — I5022 Chronic systolic (congestive) heart failure: Secondary | ICD-10-CM | POA: Insufficient documentation

## 2024-07-26 DIAGNOSIS — I480 Paroxysmal atrial fibrillation: Secondary | ICD-10-CM | POA: Insufficient documentation

## 2024-07-26 DIAGNOSIS — I25810 Atherosclerosis of coronary artery bypass graft(s) without angina pectoris: Secondary | ICD-10-CM | POA: Insufficient documentation

## 2024-07-26 DIAGNOSIS — I10 Essential (primary) hypertension: Secondary | ICD-10-CM | POA: Insufficient documentation

## 2024-07-26 MED ORDER — PRAVASTATIN SODIUM 40 MG PO TABS: 40.0000 mg | ORAL_TABLET | Freq: Every day | ORAL | 3 refills | Status: DC

## 2024-07-26 MED ORDER — SPIRONOLACTONE 25 MG PO TABS: 25.0000 mg | ORAL_TABLET | Freq: Every day | ORAL | 3 refills | Status: AC

## 2024-07-26 NOTE — Patient Instructions (Addendum)
 Medication Instructions:   Please restart pravastatin  daily Please start spironolactone 25 mg daily  If you need a refill on your cardiac medications before your next appointment, please call your pharmacy.   Lab work: No new labs needed  Testing/Procedures: No new testing needed  Follow-Up: At [facility], you and your health needs are our priority.  As part of our continuing mission to provide you with exceptional heart care, we have created designated Provider Care Teams.  These Care Teams include your primary Cardiologist (physician) and Advanced Practice Providers (APPs -  Physician Assistants and Nurse Practitioners) who all work together to provide you with the care you need, when you need it.  You will need a follow up appointment in 6 months  Providers on your designated Care Team:   Lonni Meager, NP Bernardino Bring, PA-C Cadence Franchester, [place]  COVID-19 Vaccine Information can be found at: [email or web address] For questions related to vaccine distribution or appointments, please email [email or web address] or call [phone].

## 2024-07-27 ENCOUNTER — Encounter: Admitting: Cardiology

## 2024-08-01 NOTE — Progress Notes (Unsigned)
  Electrophysiology Office Follow up Visit Note:    Date:  08/01/2024   ID:  [name], DOB Aug 20, 1956, MRN [id number]  PCP:  Orlando Dwayne NOVAK, FNP  [facility] Cardiologist:  Evalene Lunger, MD  [facility] Electrophysiologist:  OLE ONEIDA HOLTS, MD    Interval History:     [name] is a 68 y.o. male who presents for a follow up visit.   The patient had an ICD implanted April 11, 2024 for secondary prevention. Remote checks following the implant shows stable device function.       Past medical, surgical, social and family history were reviewed.  ROS:   Please see the history of present illness.    All other systems reviewed and are negative.  EKGs/Labs/Other Studies Reviewed:    The following studies were reviewed today:  August 03, 2024 in-clinic device interrogation personally reviewed ***        Physical Exam:    VS:  There were no vitals taken for this visit.    Wt Readings from Last 3 Encounters:  07/26/24 198 lb 8 oz (90 kg)  06/13/24 196 lb 3.2 oz (89 kg)  05/31/24 193 lb 12.8 oz (87.9 kg)     GEN: no distress CARD: RRR, No MRG.  Generator pocket well-healed RESP: No IWOB. CTAB.      ASSESSMENT:    No diagnosis found. PLAN:    In order of problems listed above:  #Chronic systolic heart failure #History of cardiac arrest #ICD in situ Patient is doing well after leaving the hospital.  No treated arrhythmias.  His ICD is functioning appropriately.  Continue remote monitoring.  NYHA class II today.  Continue spironolactone , metoprolol , irbesartan   I discussed my upcoming departure from [name] during today's clinic appointment.  The patient will continue to follow-up with one of my EP partners moving forward.  Follow-up 1 year with EP APP.   Signed, Ole Holts, MD, [facility], [facility] 08/01/2024 8:40 AM    Electrophysiology [facility]

## 2024-08-02 LAB — LAB REPORT - SCANNED: EGFR: 96

## 2024-08-03 ENCOUNTER — Encounter: Payer: Self-pay | Admitting: Cardiology

## 2024-08-03 ENCOUNTER — Ambulatory Visit: Attending: Cardiology | Admitting: Cardiology

## 2024-08-03 VITALS — BP 118/60 | HR 58 | Ht 64.0 in | Wt 199.0 lb

## 2024-08-03 DIAGNOSIS — I5022 Chronic systolic (congestive) heart failure: Secondary | ICD-10-CM

## 2024-08-03 DIAGNOSIS — I4901 Ventricular fibrillation: Secondary | ICD-10-CM | POA: Diagnosis not present

## 2024-08-03 DIAGNOSIS — I469 Cardiac arrest, cause unspecified: Secondary | ICD-10-CM | POA: Diagnosis not present

## 2024-08-03 DIAGNOSIS — Z95810 Presence of automatic (implantable) cardiac defibrillator: Secondary | ICD-10-CM

## 2024-08-03 LAB — CUP PACEART INCLINIC DEVICE CHECK
Date Time Interrogation Session: 20251210122607
HighPow Impedance: 84 Ohm
Implantable Lead Connection Status: 753985
Implantable Lead Connection Status: 753985
Implantable Lead Implant Date: 20250818
Implantable Lead Implant Date: 20250818
Implantable Lead Location: 753859
Implantable Lead Location: 753860
Implantable Lead Model: 672
Implantable Lead Model: 7841
Implantable Lead Serial Number: 1644910
Implantable Lead Serial Number: 290861
Implantable Pulse Generator Implant Date: 20250818
Lead Channel Impedance Value: 386 Ohm
Lead Channel Impedance Value: 696 Ohm
Lead Channel Pacing Threshold Amplitude: 0.6 V
Lead Channel Pacing Threshold Amplitude: 0.6 V
Lead Channel Pacing Threshold Amplitude: 0.7 V
Lead Channel Pacing Threshold Amplitude: 0.7 V
Lead Channel Pacing Threshold Pulse Width: 0.4 ms
Lead Channel Pacing Threshold Pulse Width: 0.4 ms
Lead Channel Pacing Threshold Pulse Width: 0.4 ms
Lead Channel Pacing Threshold Pulse Width: 0.4 ms
Lead Channel Sensing Intrinsic Amplitude: 1.4 mV
Lead Channel Sensing Intrinsic Amplitude: 25 mV
Lead Channel Setting Pacing Amplitude: 2 V
Lead Channel Setting Pacing Amplitude: 2.5 V
Lead Channel Setting Pacing Pulse Width: 0.4 ms
Lead Channel Setting Sensing Sensitivity: 0.5 mV
Pulse Gen Serial Number: 708605

## 2024-08-03 NOTE — Patient Instructions (Signed)
 Medication Instructions:  Your physician recommends that you continue on your current medications as directed. Please refer to the Current Medication list given to you today.  *If you need a refill on your cardiac medications before your next appointment, please call your pharmacy*  Follow-Up: At [facility], you and your health needs are our priority.  As part of our continuing mission to provide you with exceptional heart care, our providers are all part of one team.  This team includes your primary Cardiologist (physician) and Advanced Practice Providers or APPs (Physician Assistants and Nurse Practitioners) who all work together to provide you with the care you need, when you need it.  Your next appointment:   1 year  Provider:   Donnice Primus, MD

## 2024-08-04 ENCOUNTER — Ambulatory Visit: Payer: Self-pay | Admitting: Student in an Organized Health Care Education/Training Program

## 2024-08-04 ENCOUNTER — Other Ambulatory Visit: Payer: Self-pay | Admitting: Emergency Medicine

## 2024-08-11 ENCOUNTER — Telehealth: Payer: Self-pay | Admitting: Cardiovascular Disease

## 2024-08-11 NOTE — Telephone Encounter (Signed)
 Called and spoke with [name], wife of the patient, per DPR.  She states that the patient's PCP wants the patient to start taking Vitamin D with K2 for very low vitamin D levels.  And they were unsure of how K2 would interact with the patient's heart medications and were informed by the PCP to contact the heart MD.  Patient advised that their question would be forwarded to Dr. Gollan, the doctor they saw at their last office visit and the pharmacy team and we will reach back out to them with any advice/recommendations.  [name] verbalized understanding and expressed gratitude for the call.  All other questions and concerns addressed at this time.

## 2024-08-11 NOTE — Telephone Encounter (Signed)
 PCP told pt to start taking vitamin D with K2 but the pharmacist told pt to contact Heart Dr. Annemarie. Please advise.

## 2024-08-12 NOTE — Telephone Encounter (Signed)
 Called and spoke with patient. Notified him of the following from Dr. Gollan.  I do not think it will be a major issue to take vitamin D with K If they are concerned perhaps take the supplement at a different time than his prescription medication Thx TGollan  Patient verbalizes understanding.

## 2024-08-16 ENCOUNTER — Telehealth: Payer: Self-pay | Admitting: Cardiovascular Disease

## 2024-08-16 DIAGNOSIS — G7000 Myasthenia gravis without (acute) exacerbation: Secondary | ICD-10-CM

## 2024-08-16 MED ORDER — EZETIMIBE 10 MG PO TABS: 10.0000 mg | ORAL_TABLET | Freq: Every day | ORAL | 3 refills | Status: AC

## 2024-08-16 NOTE — Telephone Encounter (Signed)
 Called and spoke with [name], wife, per DPR.  She says that the patient was sent to [facility] emergency room from their doctor's office yesterday when they noticed a droop in one of his eye lids.  They went to emergency room, had a CT scan and blood work done and all of that came back ok, but the ER doctor has diagnosed the patient with Myasthenia Gravis and attributing the cause to Pravastatin  and Metoprolol  Succinate.  According to [name], the ER doctor wants [name] to stop taking both of those medications and the patient has been referred to [facility] neurology for a thorough workup.  [name] would like to have a confirmation from Dr. Gollan or another cardiologist in our office prior to stopping these medications.  She would also like to stay within [facility] network for a neurologist work up, so wants to know if we can refer her to one of our neurologist internally.  And if this can be done urgently, that would be great.  Advised the patient that Dr. Gollan is not in clinic this week, but this message would be forwarded to Dr. Agbor-Etang (DOD) for his advice and input.  [name] verbalized understanding and expressed gratitude for the call.

## 2024-08-16 NOTE — Telephone Encounter (Signed)
 Pt spouse called in. She states pt was in [facility] last night and he is being referred to neurologist at [facility]. She states she would like to speak to a nurse about it.

## 2024-08-16 NOTE — Telephone Encounter (Signed)
 I spoke with Ms. Idell Helling Oetken with an update from the Doctor of the Day, Dr. Darliss. As per Dr. Budd:  Genna to hold/stop metoprolol  and pravastatin  for now. There are some reports of metoprolol  worsening myasthenia gravis. Okay to refer to neurology. Start Zetia  10 mg daily with stopping pravastatin . Schedule follow-up with Dr. Gollan.    Patient and wife agreed to stop metoprolol  and pravastatin . Will begin Zetia  10 mg daily. Patient verbalized understanding and all questions answered at this time.  Metoprolol  and pravastatin  discontinued. Zetia  sent to [facility] in [place], [place]. Urgent referral sent to Neurology for myasthenia gravis. Message sent to scheduling to set up a follow up appointment with Dr. Gollan.

## 2024-08-17 ENCOUNTER — Encounter: Payer: Self-pay | Admitting: Neurology

## 2024-08-22 ENCOUNTER — Encounter: Payer: Self-pay | Admitting: Cardiovascular Disease

## 2024-08-22 ENCOUNTER — Ambulatory Visit: Admitting: Cardiovascular Disease

## 2024-08-22 VITALS — BP 140/75 | HR 62 | Ht 64.0 in | Wt 200.4 lb

## 2024-08-22 DIAGNOSIS — R0602 Shortness of breath: Secondary | ICD-10-CM | POA: Insufficient documentation

## 2024-08-22 DIAGNOSIS — I4901 Ventricular fibrillation: Secondary | ICD-10-CM | POA: Diagnosis not present

## 2024-08-22 DIAGNOSIS — I5043 Acute on chronic combined systolic (congestive) and diastolic (congestive) heart failure: Secondary | ICD-10-CM | POA: Insufficient documentation

## 2024-08-22 DIAGNOSIS — I480 Paroxysmal atrial fibrillation: Secondary | ICD-10-CM | POA: Insufficient documentation

## 2024-08-22 DIAGNOSIS — Z95810 Presence of automatic (implantable) cardiac defibrillator: Secondary | ICD-10-CM | POA: Diagnosis not present

## 2024-08-22 DIAGNOSIS — I25708 Atherosclerosis of coronary artery bypass graft(s), unspecified, with other forms of angina pectoris: Secondary | ICD-10-CM | POA: Diagnosis not present

## 2024-08-22 DIAGNOSIS — I255 Ischemic cardiomyopathy: Secondary | ICD-10-CM | POA: Diagnosis not present

## 2024-08-22 DIAGNOSIS — I5022 Chronic systolic (congestive) heart failure: Secondary | ICD-10-CM | POA: Insufficient documentation

## 2024-08-22 DIAGNOSIS — I469 Cardiac arrest, cause unspecified: Secondary | ICD-10-CM | POA: Insufficient documentation

## 2024-08-22 MED ORDER — REPATHA SURECLICK 140 MG/ML ~~LOC~~ SOAJ: 140.0000 mg | SUBCUTANEOUS | 3 refills | Status: DC

## 2024-08-22 NOTE — Patient Instructions (Addendum)
 Medication Instructions:  Repatha  140 mg sq every 2 weeks, 3 months at a time   If you need a refill on your cardiac medications before your next appointment, please call your pharmacy.   Lab work: No new labs needed  Testing/Procedures: No new testing needed  Follow-Up: At [facility], you and your health needs are our priority.  As part of our continuing mission to provide you with exceptional heart care, we have created designated Provider Care Teams.  These Care Teams include your primary Cardiologist (physician) and Advanced Practice Providers (APPs -  Physician Assistants and Nurse Practitioners) who all work together to provide you with the care you need, when you need it.  You will need a follow up appointment in 6 months  Providers on your designated Care Team:   Lonni Meager, NP Bernardino Bring, PA-C Cadence Franchester, [place]  COVID-19 Vaccine Information can be found at: [email or web address] For questions related to vaccine distribution or appointments, please email [email or web address] or call [phone].

## 2024-08-22 NOTE — Progress Notes (Signed)
 Cardiology Office Note  Date:  08/22/2024   ID:  [name], [name] 03/30/1956, MRN [id number]  PCP:  Orlando Dwayne NOVAK, FNP   Chief Complaint  Patient presents with   Follow-up    Pt doing good.    HPI:  68 year old gentleman with a history of  three vessel CAD,  CABG 01/2002, LIMA to the LAD, vein graft to the diagonal, vein graft to the OM and vein graft to the distal RCA.  cath 2014 showing patent grafts severe three-vessel disease. hyperlipidemia  hypertension,  smoking history for 23 years who stopped in 2003,   Chronic leukocytosis  followed by oncology  at [facility]  who presents for routine follow up of his coronary artery disease.   LOV 12/24 August 15, 2024 seen by primary care, noted a drooping right eyelid and was sent to [facility] emergency room to rule out stroke CT scan head, blood work completed, diagnosed with possible myasthenia gravis ER concern this was secondary to pravastatin , metoprolol  succinate, they recommended he stop the medications  In follow-up today, he reports that he has stopped metoprolol  and pravastatin , has started Zetia  10 mg daily for cholesterol instead  Feels eyelid drooping on right is worse Scheduled to see neurology beginning of January  Having single vision, affecting his balance Watching TV at night has to lean his head back  Appears euvolemic on current medication regiment No regular exercise program  Other past medical history reviewed Was working in the yard with chain saw Out-of-hospital cardiac arrest 04/07/24 Wife complex tachycardia EMS arrived, wife did CPR before rescue arrived EMS strip with wide-complex tachycardia Cardiac cath :No change in coronary anatomy from 2014, no intervention was needed Echo EF 40-45%, ICD place Repeat echo October 2025 EF remained 40 to 45%,   Cardiac MRI EF 45% LGE pattern in the basal inferoseptal, basal to mid inferior, and apical lateral walls suggests prior myocardial infarction in  those areas.   hobbies involve buying and reselling books,  Lab work reviewed Total cholesterol 144 LDL 76 Normal CMP elevated alkaline phosphatase  EKG personally reviewed by myself on todays visit EKG Interpretation Date/Time:  Monday August 22 2024 13:40:37 EST Ventricular Rate:  62 PR Interval:  148 QRS Duration:  102 QT Interval:  446 QTC Calculation: 452 R Axis:   -3  Text Interpretation: Normal sinus rhythm Inferior infarct (cited on or before 03-May-2024) Anteroseptal infarct (cited on or before 03-May-2024) When compared with ECG of 26-Jul-2024 11:24, No significant change was found Confirmed by Perla Lye [phone]) on 08/22/2024 1:53:14 PM   Imaging studies reviewed with him on today's visit Echo 10/23 Low normal ejection fraction 50 to 55%, improved from prior study in 2017 Diastolic dysfunction noted Right ventricular function No significant valvular heart disease  Zio monitor Patient had a min HR of 43 bpm 03:48am, 10/18 , max HR of 184 bpm, and avg HR of 63  bpm. Predominant underlying rhythm was Sinus Rhythm. 49  Supraventricular Tachycardia runs occurred, the run with the fastest  interval lasting 5 beats with a max rate of 184 bpm, the longest lasting  17.7 secs with an avg rate of 99 bpm. Isolated SVEs were rare (<1.0%),  SVE Couplets were rare (<1.0%), and SVE Triplets were rare (<1.0%).  Isolated VEs were occasional (2.5%, 25300), VE Couplets were rare  (<1.0%, 333), and VE Triplets were rare (<1.0%, 19). Ventricular Bigeminy  and Trigeminy were present.   Number of Triggered Events and/or Diary Events: 4 triggered, 1  diary may faint, Sweating, lightheaded, dizziness  Findings within  45 sec of Triggers and/or associated with Diary Events:  sinus rhythm 57-81 bpm with occasional ectopy   Conclusions:  - Patient triggered/diary events corresponded to  sinus rhythm 57-81 bpm with occasional ectopy. This includes an episode 06/03/22 11:45am may  faint, Sweating, lightheaded, dizziness during which sinus rhythm at 69bpm was recorded  -  49 SVT episodes, the longest of which lasted 17.7 seconds. No triggered/diary events were associated with SVT  -  Occasional PVC's, 2.5% overall burden   Seen in the ER at [facility] June 01, 2022 for near syncope Was sitting in a chair at home, felt warm, diaphoretic, dizzy Per wife he looked pale Blood pressure was low at home, EMS called Vitals normal per EMS Symptoms lasted 1 hour, felt normal by the time he reached the emergency room Similar episode 2 weeks earlier, at the time heart rate was 130 bpm Work-up in the ER negative, was discharged with Zio patch Troponin negative, EKG with normal sinus rhythm CTA head with no acute findings  other past medical history works 10 hours per day, 4 days per week  In heavy machinery erectile dysfunction symptoms He is not on testosterone  supplement as he was previously.  Reports that oncology does not want his testosterone  high    previously reported some cramping in his legs , unclear if this is from pravastatin  In the past he had frequent episodes of sweating, malaise/weakness initially felt to be secondary to bradycardia from metoprolol    previous 30 day monitor in October 2015 given his continued symptoms of sweating, malaise, weakness. Monitor did not show any significant arrhythmia. No significant bradycardia noted    episode 03/28/2014 where he was working with his tools, sitting, started sweating all over. This happened at 9 AM. His work colleagues sat him in a chair and gave him something to drink. He felt weak for 3 days   catheterization at the end of 2014 for anginal symptoms showing patent grafts severe three-vessel disease. Medical management was recommended.   In recovery following a cardiac catheterization, he had episodes of sweating/flushing, malaise associated with bradycardia. Initially it was felt that this was a vagal response to  pressure being applied to his groin. Later in recovery, he continued to have bradycardia. He was told to hold his metoprolol  at the time of discharge. lushing, dizziness, malaise that he was having at home prior to the procedure resolved.  PMH:   has a past medical history of CAD (coronary artery disease), CHF (congestive heart failure) (HCC), Chronic fatigue, Complication of anesthesia, COPD (chronic obstructive pulmonary disease) (HCC), Dyspnea, HLD (hyperlipidemia), HTN (hypertension), Leukocytosis, Myocardial infarction (HCC), Pneumonia, Vertigo, and Wears dentures.  PSH:    Past Surgical History:  Procedure Laterality Date   APPENDECTOMY     APPENDECTOMY     CATARACT EXTRACTION W/PHACO Right 03/20/2020   Procedure: CATARACT EXTRACTION PHACO AND INTRAOCULAR LENS PLACEMENT (IOC) RIGHT;  Surgeon: Jaye Fallow, MD;  Location: [facility];  Service: Ophthalmology;  Laterality: Right;  4.86 0:30.2   CATARACT EXTRACTION W/PHACO Left 04/10/2020   Procedure: CATARACT EXTRACTION PHACO AND INTRAOCULAR LENS PLACEMENT (IOC) LEFT 4.99 00:27.6;  Surgeon: Jaye Fallow, MD;  Location: [facility];  Service: Ophthalmology;  Laterality: Left;   COLONOSCOPY     CORONARY ARTERY BYPASS GRAFT  02/2002   4 vessel   HERNIA REPAIR     x 2   ICD IMPLANT N/A 04/11/2024   Procedure: ICD  IMPLANT;  Surgeon: Kennyth Chew, MD;  Location: [facility] INVASIVE CV LAB;  Service: Cardiovascular;  Laterality: N/A;   LEFT HEART CATH AND CORS/GRAFTS ANGIOGRAPHY N/A 04/07/2024   Procedure: LEFT HEART CATH AND CORS/GRAFTS ANGIOGRAPHY;  Surgeon: Ladona Heinz, MD;  Location: [facility] INVASIVE CV LAB;  Service: Cardiovascular;  Laterality: N/A;   NECK SURGERY     cervical fusion    Current Outpatient Medications  Medication Sig Dispense Refill   albuterol  (VENTOLIN  HFA) 108 (90 Base) MCG/ACT inhaler Inhale 2 puffs into the lungs.     ALPRAZolam (XANAX) 0.25 MG tablet Take 0.25 mg by mouth at bedtime as needed for  anxiety or sleep.     aspirin  81 MG EC tablet Take 1 tablet (81 mg total) by mouth every morning. 90 tablet 3   budesonide -glycopyrrolate -formoterol  (BREZTRI  AEROSPHERE) 160-9-4.8 MCG/ACT AERO inhaler Inhale 2 puffs into the lungs in the morning and at bedtime. 2 each    ezetimibe  (ZETIA ) 10 MG tablet Take 1 tablet (10 mg total) by mouth daily. 90 tablet 3   irbesartan  (AVAPRO ) 150 MG tablet TAKE 1 TABLET BY MOUTH EVERY DAY 90 tablet 1   nitroGLYCERIN  (NITROSTAT ) 0.4 MG SL tablet Place 1 tablet (0.4 mg total) under the tongue every 5 (five) minutes as needed for chest pain. 25 tablet 1   spironolactone  (ALDACTONE ) 25 MG tablet Take 1 tablet (25 mg total) by mouth daily. 90 tablet 3   tadalafil  (CIALIS ) 20 MG tablet Take 20 mg by mouth daily as needed.     No current facility-administered medications for this visit.   Allergies:   Heparin (bovine), Codeine, Heparin, Penicillin g, Penicillins, Tape, and Wound dressing adhesive   Social History:  The patient  reports that he quit smoking about 22 years ago. His smoking use included cigarettes. He started smoking about 43 years ago. He has a 21 pack-year smoking history. He has never used smokeless tobacco. He reports that he does not currently use alcohol after a past usage of about 2.0 standard drinks of alcohol per week. He reports that he does not use drugs.   Family History:   family history includes Heart attack (age of onset: 81) in his father; Heart attack (age of onset: 44) in his mother.    Review of Systems: Review of Systems  Constitutional: Negative.   HENT: Negative.    Respiratory: Negative.    Cardiovascular: Negative.   Gastrointestinal: Negative.   Musculoskeletal: Negative.   Neurological: Negative.   Psychiatric/Behavioral: Negative.    All other systems reviewed and are negative.  PHYSICAL EXAM: VS:  BP (!) 140/75 (BP Location: Left Arm, Patient Position: Sitting, Cuff Size: Normal)   Pulse 62 Comment: 65 oximeter   Ht 5' 4 (1.626 m)   Wt 200 lb 6.4 oz (90.9 kg)   SpO2 98%   BMI 34.40 kg/m  , BMI Body mass index is 34.4 kg/m. Constitutional:  oriented to person, place, and time. No distress.  HENT:  Head: Grossly normal Eyes:  no discharge. No scleral icterus.  Drooping of right eyelid Neck: No JVD, no carotid bruits  Cardiovascular: Regular rate and rhythm, no murmurs appreciated Pulmonary/Chest: Clear to auscultation bilaterally, no wheezes or rails Abdominal: Soft.  no distension.  no tenderness.  Musculoskeletal: Normal range of motion Neurological:  normal muscle tone. Coordination normal. No atrophy Skin: Skin warm and dry Psychiatric: normal affect, pleasant  Recent Labs: 04/09/2024: Magnesium  2.1 05/03/2024: BUN 10; Creatinine, Ser 0.76; Hemoglobin 14.1; Platelets 231; Potassium 4.4; Sodium  142 05/25/2024: ALT 20    Lipid Panel Lab Results  Component Value Date   CHOL 144 05/25/2024   HDL 51 05/25/2024   LDLCALC 76 05/25/2024   TRIG 91 05/25/2024    Wt Readings from Last 3 Encounters:  08/22/24 200 lb 6.4 oz (90.9 kg)  08/03/24 199 lb (90.3 kg)  07/26/24 198 lb 8 oz (90 kg)     ASSESSMENT AND PLAN:  Cardiac arrest, wide-complex tachycardia Out-of-hospital cardiac arrest wife gave CPR, resuscitated by EMS, cardiac catheterization performed, cardiac MRI, ICD placed Followed by  EP - Chronic stable angina No medication changes made at this time Currently off metoprolol , pravastatin  out of concern these are contributing to possible myasthenia gravis, right eyelid drooping  Ptosis Scheduled to see neurology in 2 weeks ER team at [facility] recommended he stop metoprolol  and pravastatin  out of concern these were possibly contributing to myasthenia gravis - We will continue to hold until he has been cleared by neurology  Cardiomyopathy, mixed ischemic and nonischemic Ejection fraction 40 to 45% following cardiac arrest Recommend he continue  irbesartan  150 daily Metoprolol   succinate on hold Continue spironolactone  25 daily - Would benefit from cardiac rehab given the above, order placed with appropriate diagnosis codes  HYPERTENSION, BENIGN -  Continue irbesartan  and spironolactone   Atherosclerosis of coronary artery bypass graft of native heart with stable angina pectoris History of CABG  Cardiac catheterization on recent hospital admission for out-of-hospital cardiac arrest, unchanged from prior catheterization 2014 - Chronic stable angina  Mixed hyperlipidemia Pravastatin  on hold for now - After cleared by neurology concerning etiology of his eyelid drooping, will likely start Repatha , Zetia   Chronic shortness of breath/COPD Prior smoking history, COPD Prior COVID out-of-hospital cardiac arrest with depressed ejection fraction, Chronic diastolic and systolic CHF, appears euvolemic   Orders Placed This Encounter  Procedures   EKG 12-Lead      Signed, Velinda Lunger, [name], Ph.D. 08/22/2024  [facility] [place], [place] [phone]

## 2024-08-24 ENCOUNTER — Ambulatory Visit (INDEPENDENT_AMBULATORY_CARE_PROVIDER_SITE_OTHER)

## 2024-08-24 DIAGNOSIS — I480 Paroxysmal atrial fibrillation: Secondary | ICD-10-CM | POA: Diagnosis not present

## 2024-08-29 LAB — CUP PACEART REMOTE DEVICE CHECK
Battery Remaining Longevity: 156 mo
Battery Remaining Percentage: 100 %
Brady Statistic RA Percent Paced: 26 %
Brady Statistic RV Percent Paced: 0 %
Date Time Interrogation Session: 20251231023100
HighPow Impedance: 82 Ohm
Implantable Lead Connection Status: 753985
Implantable Lead Connection Status: 753985
Implantable Lead Implant Date: 20250818
Implantable Lead Implant Date: 20250818
Implantable Lead Location: 753859
Implantable Lead Location: 753860
Implantable Lead Model: 672
Implantable Lead Model: 7841
Implantable Lead Serial Number: 1644910
Implantable Lead Serial Number: 290861
Implantable Pulse Generator Implant Date: 20250818
Lead Channel Impedance Value: 373 Ohm
Lead Channel Impedance Value: 747 Ohm
Lead Channel Pacing Threshold Amplitude: 0.4 V
Lead Channel Pacing Threshold Amplitude: 0.7 V
Lead Channel Pacing Threshold Pulse Width: 0.4 ms
Lead Channel Pacing Threshold Pulse Width: 0.4 ms
Lead Channel Setting Pacing Amplitude: 2 V
Lead Channel Setting Pacing Amplitude: 2.5 V
Lead Channel Setting Pacing Pulse Width: 0.4 ms
Lead Channel Setting Sensing Sensitivity: 0.5 mV
Pulse Gen Serial Number: 708605

## 2024-08-30 ENCOUNTER — Ambulatory Visit: Payer: Self-pay | Admitting: Student in an Organized Health Care Education/Training Program

## 2024-08-30 NOTE — Progress Notes (Signed)
 Remote ICD Transmission

## 2024-08-31 ENCOUNTER — Other Ambulatory Visit: Payer: Self-pay | Admitting: *Deleted

## 2024-09-06 ENCOUNTER — Other Ambulatory Visit

## 2024-09-06 ENCOUNTER — Ambulatory Visit: Payer: Self-pay | Admitting: Neurology

## 2024-09-06 ENCOUNTER — Encounter: Payer: Self-pay | Admitting: Neurology

## 2024-09-06 ENCOUNTER — Telehealth: Payer: Self-pay | Admitting: Cardiovascular Disease

## 2024-09-06 VITALS — BP 148/69 | HR 61 | Ht 64.0 in | Wt 200.0 lb

## 2024-09-06 DIAGNOSIS — H02402 Unspecified ptosis of left eyelid: Secondary | ICD-10-CM | POA: Diagnosis not present

## 2024-09-06 DIAGNOSIS — R001 Bradycardia, unspecified: Secondary | ICD-10-CM | POA: Diagnosis not present

## 2024-09-06 NOTE — Telephone Encounter (Signed)
 Pt c/o medication issue:  1. Name of Medication:     metoprolol  tartrate (LOPRESSOR ) tablet 50 mg  pravastatin  (PRAVACHOL ) 40 MG  ezetimibe  (ZETIA ) 10 MG tablet   2. How are you currently taking this medication (dosage and times per day)?   3. Are you having a reaction (difficulty breathing--STAT)?   4. What is your medication issue?   Wife ([name]) stated patient has stopped these medication prior to seeing the Neurologist.  Wife stated neurologist said patient can re-start the metoprolol  but wife wants to know which medication should patient re-start - Pravastatin  or Zetia .  Wife noted neurologist has done several tests and has not ruled out Myasthenia Gravis and patient may need to go to [facility] for further study.  Wife noted patient HR 61 and BP 148/74 today at neurologist's office and patient's BP had been trending high  Wife stated patient's insurance will not cover Evolocumab  (REPATHA  SURECLICK) 140 MG/ML SOAJ and it is too expensive.

## 2024-09-06 NOTE — Progress Notes (Signed)
 [facility] Clinic Note - Initial Visit   Date: 09/06/2024   [name] MRN: [id number] DOB: 1955-09-25   Dear Dr. Darliss:  Thank you for your kind referral of [name] for consultation of right ptosis. Although his history is well known to you, please allow us  to reiterate it for the purpose of our medical record. The patient was accompanied to the clinic by wife who also provides collateral information.     [name] is a 69 y.o. right-handed male with chronic leukocytosis, cardiac arrest s/p ICD, CAD, COPD, hypertension, hyperlipidemia, and OSA presenting for evaluation of right ptosis.   IMPRESSION/PLAN: Assessment & Plan Suspected myasthenia gravis manifesting with right ptosis and diplopia.  He does not have bulbar or limb weakness.  Mestinon trial not feasible due to bradycardia. - Check AChR antibody and MuSK, TSH - OK to restarted metoprolol  and pravastatin  - If antibody test is positive, initiate prednisone . - If antibody test is negative, refer for single fiber EMG testing.  Further recommendations pending results.  ------------------------------------------------------------- History of present illness: Discussed the use of AI scribe software for clinical note transcription with the patient, who gave verbal consent to proceed.  History of Present Illness [name] is a 69 year old male with coronary artery disease who presents with right eyelid drooping and double vision. He is accompanied by his wife.  His right eyelid began drooping a couple of days before visiting the emergency department on 08/15/2024. CT head did not reveal stroke. There was clinical suspicion for myasthenia gravis, so referred to neurology.  He reports that his right eyelid remains mostly closed, with slight improvement in the morning lasting 30 to 40 minutes before closing again. No factors have been identified that affect the droopiness.  He has  recently started experiencing double vision, particularly when his eyelid is open, with images on top of each other.  No droopiness of the left eyelid, speech or swallowing difficulties, or arm or leg weakness. He denies any autoimmune diseases, thyroid issues, or diabetes.  His medication history includes spironolactone , started after a cardiac arrest in August. Metoprolol  and pravastatin  were paused by his cardiologist until further evaluation.   His past medical history is significant for a cardiac arrest in August and has had ICD placed.  He has had three cardiac arrests since 1991 and underwent bypass surgery in 2003.   Socially, he does not smoke and has reduced his alcohol consumption since his last cardiac arrest, now drinking one beer every two to three days. He used to drink two beers a day.   Out-side paper records, electronic medical record, and images have been reviewed where available and summarized as:  CT head wo contrast 08/15/2024: No acute intracranial abnormality.  Sinusitis maxillary and ethmoid sinuses.    Lab Results  Component Value Date   HGBA1C 5.4 04/07/2024   No results found for: [id number] Lab Results  Component Value Date   TSH 2.270 04/07/2014   No results found for: ESRSEDRATE, POCTSEDRATE  Past Medical History:  Diagnosis Date   CAD (coronary artery disease)    CHF (congestive heart failure) (HCC)    Chronic fatigue    Complication of anesthesia    BP drops.  Slow to wake.   COPD (chronic obstructive pulmonary disease) (HCC)    Dyspnea    with exertion   HLD (hyperlipidemia)    HTN (hypertension)    Leukocytosis    Myocardial infarction (HCC)    (  x2)  last time 02/2002   Pneumonia    Hx of   Vertigo    1 episode, 5-6 yrs ago   Wears dentures    full upper    Past Surgical History:  Procedure Laterality Date   APPENDECTOMY     APPENDECTOMY     CATARACT EXTRACTION W/PHACO Right 03/20/2020   Procedure: CATARACT EXTRACTION PHACO  AND INTRAOCULAR LENS PLACEMENT (IOC) RIGHT;  Surgeon: Jaye Fallow, MD;  Location: [facility];  Service: Ophthalmology;  Laterality: Right;  4.86 0:30.2   CATARACT EXTRACTION W/PHACO Left 04/10/2020   Procedure: CATARACT EXTRACTION PHACO AND INTRAOCULAR LENS PLACEMENT (IOC) LEFT 4.99 00:27.6;  Surgeon: Jaye Fallow, MD;  Location: [facility];  Service: Ophthalmology;  Laterality: Left;   COLONOSCOPY     CORONARY ARTERY BYPASS GRAFT  02/2002   4 vessel   HERNIA REPAIR     x 2   ICD IMPLANT N/A 04/11/2024   Procedure: ICD IMPLANT;  Surgeon: Kennyth Chew, MD;  Location: [facility] IN[facility]AS[facility] CV LAB;  Service: Cardiovascular;  Laterality: N/A;   LEFT HEART CATH AND CORS/GRAFTS ANGIOGRAPHY N/A 04/07/2024   Procedure: LEFT HEART CATH AND CORS/GRAFTS ANGIOGRAPHY;  Surgeon: Ladona Heinz, MD;  Location: [facility] INVASIVE CV LAB;  Service: Cardiovascular;  Laterality: N/A;   NECK SURGERY     cervical fusion     Medications:  Outpatient Encounter Medications as of 09/06/2024  Medication Sig   albuterol  (VENTOLIN  HFA) 108 (90 Base) MCG/ACT inhaler Inhale 2 puffs into the lungs.   ALPRAZolam (XANAX) 0.25 MG tablet Take 0.25 mg by mouth at bedtime as needed for anxiety or sleep.   aspirin  81 MG EC tablet Take 1 tablet (81 mg total) by mouth every morning.   budesonide -glycopyrrolate -formoterol  (BREZTRI  AEROSPHERE) 160-9-4.8 MCG/ACT AERO inhaler Inhale 2 puffs into the lungs in the morning and at bedtime.   irbesartan  (AVAPRO ) 150 MG tablet TAKE 1 TABLET BY MOUTH EVERY DAY   nitroGLYCERIN  (NITROSTAT ) 0.4 MG SL tablet Place 1 tablet (0.4 mg total) under the tongue every 5 (five) minutes as needed for chest pain.   spironolactone  (ALDACTONE ) 25 MG tablet Take 1 tablet (25 mg total) by mouth daily.   tadalafil  (CIALIS ) 20 MG tablet Take 20 mg by mouth daily as needed.   Evolocumab  (REPATHA  SURECLICK) 140 MG/ML SOAJ Inject 140 mg into the skin every 14 (fourteen) days. (Patient not taking: Reported  on 09/06/2024)   ezetimibe  (ZETIA ) 10 MG tablet Take 1 tablet (10 mg total) by mouth daily. (Patient not taking: Reported on 09/06/2024)   No facility-administered encounter medications on file as of 09/06/2024.    Allergies: Allergies[1]  Family History: Family History  Problem Relation Age of Onset   Heart attack Mother 62   Heart attack Father 14    Social History: Social History[2] Social History   Social History Narrative   Regularly exercises- works in the yard. Full time.       Are you right handed or left handed? Right handed    Are you currently employed ? No    What is your current occupation? Retired   Do you live at home alone? No with wife   What type of home do you live in: 1 story or 2 story? Lives in a two story home        Vital Signs:  BP (!) 148/69   Pulse 61   Ht 5' 4 (1.626 m)   Wt 200 lb (90.7 kg)   SpO2 98%  BMI 34.33 kg/m   Neurological Exam: MENTAL STATUS including orientation to time, place, person, recent and remote memory, attention span and concentration, language, and fund of knowledge is normal.  Speech is not dysarthric.  CRANIAL NERVES: II:  No visual field defects.     III-IV-VI: Pupils equal round and reactive to light.  Normal conjugate, extra-ocular eye movements in all directions of gaze.  No nystagmus.  Severe left ptosis with near complete eyelid closure. No ptosis on the left. V:  Normal facial sensation.    VII:  Normal facial symmetry and movements.   VIII:  Normal hearing and vestibular function.   IX-X:  Normal palatal movement.   XI:  Normal shoulder shrug and head rotation.   XII:  Normal tongue strength and range of motion, no deviation or fasciculation.  MOTOR:  Neck flexion is 5/5.  No atrophy, fasciculations or abnormal movements.  No pronator drift.   Upper Extremity:  Right  Left  Deltoid  5/5   5/5   Biceps  5/5   5/5   Triceps  5/5   5/5   Wrist extensors  5/5   5/5   Wrist flexors  5/5   5/5   Finger  extensors  5/5   5/5   Finger flexors  5/5   5/5   Dorsal interossei  5/5   5/5   Abductor pollicis  5/5   5/5   Tone (Ashworth scale)  0  0   Lower Extremity:  Right  Left  Hip flexors  5/5   5/5   Knee flexors  5/5   5/5   Knee extensors  5/5   5/5   Dorsiflexors  5/5   5/5   Plantarflexors  5/5   5/5   Toe extensors  5/5   5/5   Toe flexors  5/5   5/5   Tone (Ashworth scale)  0  0   MSRs:                                           Right        Left brachioradialis 2+  2+  biceps 2+  2+  triceps 2+  2+  patellar 2+  2+  ankle jerk 2+  2+  Hoffman no  no  plantar response down  down   SENSORY:  Normal and symmetric perception of light touch, pinprick, vibration, and temperature.  COORDINATION/GAIT: Normal finger-to- nose-finger.  Intact rapid alternating movements bilaterally.  Able to rise from a chair without using arms.  Gait narrow based and stable. Tandem and stressed gait intact.     Thank you for allowing me to participate in patient's care.  If I can answer any additional questions, I would be pleased to do so.    Sincerely,    Loretha Ure K. Jaela Yepez, DO     [1]  Allergies Allergen Reactions   Heparin (Bovine) Anaphylaxis    Other reaction(s): Shock (ALLERGY)  ?Coma  Other reaction(s): Shock (ALLERGY) ?Coma ?Coma Other reaction(s): Shock (ALLERGY) ?Coma    ?Coma   Codeine Other (See Comments)    Breaks out into a sweat & rash  Reaction: Sweating (intolerance); Breaks out into a sweat & rash   Heparin Other (See Comments)    Respiratory failure  Other Reaction(s): respiratory failure   Penicillin G Other (See Comments)   Penicillins Other (  See Comments) and Rash    Other reaction(s): Diaphoresis / Sweating (intolerance)  Has patient had a PCN reaction causing immediate rash, facial/tongue/throat swelling, SOB or lightheadedness with hypotension: Yes  Has patient had a PCN reaction causing severe rash involving mucus membranes or skin necrosis:  No  Has patient had a PCN reaction that required hospitalization No  Has patient had a PCN reaction occurring within the last 10 years: No  If all of the above answers are NO, then may proceed with Cephalosporin use.  Other Reaction(s): sweating  Reaction: Diaphoresis / Sweating (intolerance)   Other reaction(s): Diaphoresis / Sweating (intolerance)  Has patient had a PCN reaction causing immediate rash, facial/tongue/throat swelling, SOB or lightheadedness with hypotension: Yes  Has patient had a PCN reaction causing severe rash involving mucus membranes or skin necrosis: No  Has patient had a PCN reaction that required hospitalization No  Has patient had a PCN reaction occurring within the last 10 years: No  If all of the above answers are NO, then may proceed with Cephalosporin use.    Reaction: Diaphoresis / Sweating (intolerance)   Tape Itching and Rash   Wound Dressing Adhesive Itching and Rash  [2]  Social History Tobacco Use   Smoking status: Former    Current packs/day: 0.00    Average packs/day: 1 pack/day for 21.0 years (21.0 ttl pk-yrs)    Types: Cigarettes    Start date: 03/20/1981    Quit date: 03/20/2002    Years since quitting: 22.4   Smokeless tobacco: Never   Tobacco comments:    Tobacco use - no  Vaping Use   Vaping status: Never Used  Substance Use Topics   Alcohol use: Yes    Alcohol/week: 2.0 standard drinks of alcohol    Types: 2 Cans of beer per week    Comment: Occasional Beer   Drug use: No   "

## 2024-09-09 NOTE — Telephone Encounter (Signed)
 Called patient and notified him of the following from Dr. Gollan.  Continue on the metoprolol  Would hold the pravastatin  and Zetia  until myasthenia gravis workup completed Perhaps would hold off on starting any new lipid-lowering medications until neurologic workup completed Once it is completed, we can place referral to pharmacy to discuss other options Perhaps he might qualify for Leqvio injection   TG  Patient verbalizes understanding.

## 2024-09-12 ENCOUNTER — Encounter: Payer: Self-pay | Admitting: Sleep Medicine

## 2024-09-12 ENCOUNTER — Ambulatory Visit: Admitting: Sleep Medicine

## 2024-09-12 VITALS — BP 112/60 | HR 62 | Temp 98.0°F | Ht 64.0 in | Wt 203.2 lb

## 2024-09-12 DIAGNOSIS — G4739 Other sleep apnea: Secondary | ICD-10-CM | POA: Diagnosis not present

## 2024-09-12 DIAGNOSIS — I10 Essential (primary) hypertension: Secondary | ICD-10-CM | POA: Diagnosis not present

## 2024-09-12 DIAGNOSIS — Z87891 Personal history of nicotine dependence: Secondary | ICD-10-CM

## 2024-09-12 NOTE — Progress Notes (Signed)
 "      Name:[name] MRN: [id number] DOB: [birth year]   CHIEF COMPLAINT:  CPAP F/U   HISTORY OF PRESENT ILLNESS: [name] is a 69 y.o. w/ a h/o OSA, CAD s/p AICD, HTN, hyperlipidemia and obesity who presents for CPAP follow up visit. Reports using CPAP therapy every night, which is confirmed by compliance data. He is currently using the Airfit N30i nasal mask. Reports feeling more refreshed upon awakening with CPAP therapy. Reports some issues with eyelid drooping and is currently being evaluated by neurology for myasthenia gravis.    EPWORTH SLEEP SCORE 6    06/13/2024   10:00 AM  Results of the Epworth flowsheet  Sitting and reading 1  Watching TV 1  Sitting, inactive in a public place (e.g. a theatre or a meeting) 0  As a passenger in a car for an hour without a break 0  Lying down to rest in the afternoon when circumstances permit 3  Sitting and talking to someone 0  Sitting quietly after a lunch without alcohol 1  In a car, while stopped for a few minutes in traffic 0  Total score 6    PAST MEDICAL HISTORY :   has a past medical history of CAD (coronary artery disease), CHF (congestive heart failure) (HCC), Chronic fatigue, Complication of anesthesia, COPD (chronic obstructive pulmonary disease) (HCC), Dyspnea, HLD (hyperlipidemia), HTN (hypertension), Leukocytosis, Myocardial infarction (HCC), Pneumonia, Vertigo, and Wears dentures.  has a past surgical history that includes Neck surgery; Appendectomy; Coronary artery bypass graft (02/2002); Cataract extraction w/PHACO (Right, 03/20/2020); Appendectomy; Hernia repair; Colonoscopy; Cataract extraction w/PHACO (Left, 04/10/2020); LEFT HEART CATH AND CORS/GRAFTS ANGIOGRAPHY (N/A, 04/07/2024); and ICD IMPLANT (N/A, 04/11/2024). Prior to Admission medications   Medication Sig Start Date End Date Taking? Authorizing Provider  albuterol  (VENTOLIN  HFA) 108 (90 Base) MCG/ACT inhaler Inhale 2 puffs into the lungs.   Yes [provider]  ALPRAZolam (XANAX) 0.25 MG tablet Take 0.25 mg by mouth at bedtime as needed for anxiety or sleep. 06/02/24  Yes [provider]  aspirin  81 MG EC tablet Take 1 tablet (81 mg total) by mouth every morning. 12/02/21  Yes Gollan, Timothy J, MD  budesonide -glycopyrrolate -formoterol  (BREZTRI  AEROSPHERE) 160-9-4.8 MCG/ACT AERO inhaler Inhale 2 puffs into the lungs in the morning and at bedtime. 02/25/24  Yes Tamea Dedra CROME, MD  budesonide -glycopyrrolate -formoterol  (BREZTRI  AEROSPHERE) 160-9-4.8 MCG/ACT AERO inhaler Inhale 2 puffs into the lungs in the morning and at bedtime. 05/31/24  Yes Tamea Dedra CROME, MD  irbesartan  (AVAPRO ) 150 MG tablet TAKE 1 TABLET BY MOUTH EVERY DAY 01/12/24  Yes Gollan, Timothy J, MD  metoprolol  succinate (TOPROL -XL) 25 MG 24 hr tablet Take 1.5 tablets (37.25 mg total) by mouth daily. Take with or immediately following a meal. 05/03/24 05/03/25 Yes Hammock, Sheri, NP  nitroGLYCERIN  (NITROSTAT ) 0.4 MG SL tablet Place 1 tablet (0.4 mg total) under the tongue every 5 (five) minutes as needed for chest pain. 08/04/23  Yes Gollan, Timothy J, MD  pravastatin  (PRAVACHOL ) 40 MG tablet TAKE 1 TABLET BY MOUTH EVERYDAY AT BEDTIME 03/31/24  Yes Gollan, Timothy J, MD  tadalafil  (CIALIS ) 20 MG tablet Take 20 mg by mouth daily as needed. 09/26/21  Yes [provider]   Allergies  Allergen Reactions   Heparin (Bovine) Anaphylaxis    Other reaction(s): Shock (ALLERGY)  ?Coma  Other reaction(s): Shock (ALLERGY) ?Coma ?Coma Other reaction(s): Shock (ALLERGY) ?Coma    ?Coma   Codeine Other (See Comments)    Breaks  out into a sweat & rash  Reaction: Sweating (intolerance); Breaks out into a sweat & rash   Heparin Other (See Comments)    Respiratory failure  Other Reaction(s): respiratory failure   Penicillin G Other (See Comments)   Penicillins Other (See Comments) and Rash    Other reaction(s): Diaphoresis / Sweating (intolerance)  Has patient had a PCN  reaction causing immediate rash, facial/tongue/throat swelling, SOB or lightheadedness with hypotension: Yes  Has patient had a PCN reaction causing severe rash involving mucus membranes or skin necrosis: No  Has patient had a PCN reaction that required hospitalization No  Has patient had a PCN reaction occurring within the last 10 years: No  If all of the above answers are NO, then may proceed with Cephalosporin use.  Other Reaction(s): sweating  Reaction: Diaphoresis / Sweating (intolerance)   Other reaction(s): Diaphoresis / Sweating (intolerance)  Has patient had a PCN reaction causing immediate rash, facial/tongue/throat swelling, SOB or lightheadedness with hypotension: Yes  Has patient had a PCN reaction causing severe rash involving mucus membranes or skin necrosis: No  Has patient had a PCN reaction that required hospitalization No  Has patient had a PCN reaction occurring within the last 10 years: No  If all of the above answers are NO, then may proceed with Cephalosporin use.    Reaction: Diaphoresis / Sweating (intolerance)   Tape Itching and Rash   Wound Dressing Adhesive Itching and Rash    FAMILY HISTORY:  family history includes Heart attack (age of onset: 21) in his father; Heart attack (age of onset: 84) in his mother. SOCIAL HISTORY:  reports that he quit smoking about 22 years ago. His smoking use included cigarettes. He started smoking about 43 years ago. He has a 21 pack-year smoking history. He has never used smokeless tobacco. He reports current alcohol use of about 2.0 standard drinks of alcohol per week. He reports that he does not use drugs.   Review of Systems:  Gen:  Denies  fever, sweats, chills weight loss  HEENT: Denies blurred vision, double vision, ear pain, eye pain, hearing loss, nose bleeds, sore throat Cardiac:  No dizziness, chest pain or heaviness, chest tightness,edema, No JVD Resp:   No cough, -sputum production, -shortness of  breath,-wheezing, -hemoptysis,  Gi: Denies swallowing difficulty, stomach pain, nausea or vomiting, diarrhea, constipation, bowel incontinence Gu:  Denies bladder incontinence, burning urine Ext:   Denies Joint pain, stiffness or swelling Skin: Denies  skin rash, easy bruising or bleeding or hives Endoc:  Denies polyuria, polydipsia , polyphagia or weight change Psych:   Denies depression, insomnia or hallucinations  Other:  All other systems negative  VITAL SIGNS: BP 112/60   Pulse 62   Temp 98 F (36.7 C)   Ht 5' 4 (1.626 m)   Wt 203 lb 3.2 oz (92.2 kg)   SpO2 96%   BMI 34.88 kg/m     Physical Examination:   General Appearance: No distress  EYES PERRLA, EOM intact. Unilateral R eyelid drooping   NECK Supple, No JVD Pulmonary: normal breath sounds, No wheezing.  CardiovascularNormal S1,S2.  No m/r/g.   Abdomen: Benign, Soft, non-tender. Skin:   warm, no rashes, no ecchymosis  Extremities: normal, no cyanosis, clubbing. Neuro:without focal findings,  speech normal  PSYCHIATRIC: Mood, affect within normal limits.   ASSESSMENT AND PLAN  Complex sleep apnea Patient is using and benefiting from CPAP therapy. AHI is elevated and mostly comprised of central events and CS breathing. Due to  MG diagnosis, this could be the underlying cause of central sleep apnea. Will complete further evaluation with in lab CPAP>BIPAP titration study. Due to decreased CHF with reduced EF 40-45%, patient is not a candidate for ASV therapy. Discussed the consequences of untreated sleep apnea. Advised not to drive drowsy for safety of patient and others. Will follow up to review results.    HTN Stable, on current management. Following with PCP.    Patient  satisfied with Plan of action and management. All questions answered  I spent a total of 22 minutes reviewing chart data, face-to-face evaluation with the patient, counseling and coordination of care as detailed above.    Anureet Bruington, [name]   Sleep Medicine [facility]        "

## 2024-09-12 NOTE — Patient Instructions (Addendum)
 Need to be using your CPAP every night, minimum of 4-6 hours a night.  Change equipment as directed. Wash your tubing with warm soap and water daily, hang to dry. Wash humidifier portion weekly. Use bottled, distilled water and change daily Be aware of reduced alertness and do not drive or operate heavy machinery if experiencing this or drowsiness.  Exercise encouraged, as tolerated. Healthy weight management discussed.  Avoid or decrease alcohol consumption and medications that make you more sleepy, if possible. Notify if persistent daytime sleepiness occurs even with consistent use of PAP therapy.   Change CPAP supplies... Every month Mask cushions and/or nasal pillows CPAP machine filters Every 3 months Mask frame (not including the headgear) CPAP tubing Every 6 months Mask headgear Chin strap (if applicable) Humidifier water tub

## 2024-09-14 ENCOUNTER — Telehealth: Payer: Self-pay | Admitting: Neurology

## 2024-09-14 LAB — MUSK ANTIBODIES

## 2024-09-14 LAB — MYASTHENIA GRAVIS PANEL 2
A CHR BINDING ABS: 7.1 nmol/L — ABNORMAL HIGH
ACHR Blocking Abs: <15 %{inhibition}
Acetylchol Modul Ab: 50 %{inhibition} — ABNORMAL HIGH

## 2024-09-14 LAB — TSH: TSH: 4.24 m[IU]/L (ref 0.40–4.50)

## 2024-09-14 NOTE — Telephone Encounter (Signed)
 [name] called in wanting to speak with the provider only. She stated that the lab results are in Mychart and would like for the results to be explained.   PH: [phone]

## 2024-09-15 NOTE — Telephone Encounter (Signed)
Patient scheduled for follow-up visit tomorrow.

## 2024-09-15 NOTE — Addendum Note (Signed)
 Addended by: Ailene Royal on: 09/15/2024 12:57 PM   Modules accepted: Level of Service

## 2024-09-16 ENCOUNTER — Encounter: Payer: Self-pay | Admitting: Neurology

## 2024-09-16 ENCOUNTER — Ambulatory Visit (INDEPENDENT_AMBULATORY_CARE_PROVIDER_SITE_OTHER): Payer: Self-pay | Admitting: Neurology

## 2024-09-16 VITALS — BP 149/65 | HR 59 | Ht 64.0 in | Wt 203.0 lb

## 2024-09-16 DIAGNOSIS — G7000 Myasthenia gravis without (acute) exacerbation: Secondary | ICD-10-CM | POA: Diagnosis not present

## 2024-09-16 DIAGNOSIS — R059 Cough, unspecified: Secondary | ICD-10-CM

## 2024-09-16 MED ORDER — PREDNISONE 10 MG PO TABS: 10.0000 mg | ORAL_TABLET | Freq: Every day | ORAL | 3 refills | Status: AC

## 2024-09-16 NOTE — Patient Instructions (Addendum)
 Start prednisone  10mg  daily  Start calcium  supplement  CT chest   Please call with update in 2 weeks

## 2024-09-16 NOTE — Progress Notes (Signed)
 "   Follow-up Visit   Date: 09/16/2024    [name] MRN: [id number] DOB: [birth year]    [name] is a 69 y.o. right-handed Caucasian male with chronic leukocytosis, cardiac arrest s/p ICD, CAD, COPD, hypertension, hyperlipidemia, and OSA returning to the clinic for follow-up of seropositive myasthenia gravis.  The patient was accompanied to the clinic by wife who also provides collateral information.    IMPRESSION/PLAN: Seropositive myasthenia gravis manifesting with right ptosis and diplopia.  MG-ADL 7. Diagnosis, management, and prognosis was discussed. Prednisone  initiated with potential side effects. Long-term use may be necessary. Alternative treatments include IVIG, Vyvgart Hytrulo, steroid-sparing agents.  Of note, he has baseline leukocytosis which is followed at [facility].   - CT chest with contrast to evaluate for thymoma  - Start prednisone  10mg  daily.  Update in 2 weeks to determine further titration  - Start calcium  supplement and PPI  - Unable to use mestinon due to bradycardia - Provided list of medications to use with caution - He may resume statins and beta blockers.  Return to clinic in 6 weeks  --------------------------------------------- History of present illness: His right eyelid began drooping a couple of days before visiting the emergency department on 08/15/2024. CT head did not reveal stroke. There was clinical suspicion for myasthenia gravis, so referred to neurology.  He reports that his right eyelid remains mostly closed, with slight improvement in the morning lasting 30 to 40 minutes before closing again. No factors have been identified that affect the droopiness.   He has recently started experiencing double vision, particularly when his eyelid is open, with images on top of each other.  No droopiness of the left eyelid, speech or swallowing difficulties, or arm or leg weakness. He denies any autoimmune diseases, thyroid issues, or  diabetes.    UPDATE 09/16/2024: Discussed the use of AI scribe software for clinical note transcription with the patient, who gave verbal consent to proceed.  History of Present Illness He is here to discuss results of antibody testing which is positive for myasthenia gravis. He has been experiencing persistent eyelid weakness and drooping, which he describes as 'miserable'.  He also reports double vision.  Symptoms are constant and do not fluctuate.  No problems with speech, swallow, or limb weakness.  He has shortness of breath at night time.   He has a history of leukocytosis, with the last lab results in December showing elevated white blood cell counts, though they were the best in a long time. He is monitored annually for unexplained elevated white count.  He is currently not taking any statins, as his cardiologist was concerned they might be related to his symptoms. He also has a vitamin D deficiency that is being addressed.    Medications:  Medications Ordered Prior to Encounter[1]  Allergies: Allergies[2]  Vital Signs:  BP (!) 149/65   Pulse (!) 59   Ht 5' 4 (1.626 m)   Wt 203 lb (92.1 kg)   SpO2 96%   BMI 34.84 kg/m   Neurological Exam: MENTAL STATUS including orientation to time, place, person, recent and remote memory, attention span and concentration, language, and fund of knowledge is normal.  Speech is not dysarthric.  CRANIAL NERVES:  No visual field defects.  Pupils equal round and reactive to light.  Normal conjugate, extra-ocular eye movements in all directions of gaze.  No nystagmus.  Severe right ptosis with near complete eyelid closure. No ptosis on the left. Face is symmetric. Palate elevates  symmetrically.  Tongue is midline.  MOTOR:  Motor strength is 5/5 in all extremities.  No atrophy, fasciculations or abnormal movements.  No pronator drift.  Tone is normal.    COORDINATION/GAIT:    Gait narrow based and stable.   Data: Labs 09/06/2024:  AChR binding  7.10*, AChR modulating 50*, blocking <15, MuSK neg Lab Results  Component Value Date   TSH 4.24 09/06/2024    Thank you for allowing me to participate in patient's care.  If I can answer any additional questions, I would be pleased to do so.    Sincerely,    Torra Pala K. Tobie, DO      [1]  Current Outpatient Medications on File Prior to Visit  Medication Sig Dispense Refill   albuterol  (VENTOLIN  HFA) 108 (90 Base) MCG/ACT inhaler Inhale 2 puffs into the lungs.     ALPRAZolam (XANAX) 0.25 MG tablet Take 0.25 mg by mouth at bedtime as needed for anxiety or sleep.     aspirin  81 MG EC tablet Take 1 tablet (81 mg total) by mouth every morning. 90 tablet 3   budesonide -glycopyrrolate -formoterol  (BREZTRI  AEROSPHERE) 160-9-4.8 MCG/ACT AERO inhaler Inhale 2 puffs into the lungs in the morning and at bedtime. 2 each    Evolocumab  (REPATHA  SURECLICK) 140 MG/ML SOAJ Inject 140 mg into the skin every 14 (fourteen) days. 6 mL 3   ezetimibe  (ZETIA ) 10 MG tablet Take 1 tablet (10 mg total) by mouth daily. 90 tablet 3   irbesartan  (AVAPRO ) 150 MG tablet TAKE 1 TABLET BY MOUTH EVERY DAY 90 tablet 1   metoprolol  tartrate (LOPRESSOR ) 25 MG tablet Take 25 mg by mouth 2 (two) times daily.     nitroGLYCERIN  (NITROSTAT ) 0.4 MG SL tablet Place 1 tablet (0.4 mg total) under the tongue every 5 (five) minutes as needed for chest pain. 25 tablet 1   spironolactone  (ALDACTONE ) 25 MG tablet Take 1 tablet (25 mg total) by mouth daily. 90 tablet 3   tadalafil  (CIALIS ) 20 MG tablet Take 20 mg by mouth daily as needed.     Tiotropium Bromide (SPIRIVA HANDIHALER) 18 MCG CAPS 1 capsule by inhaling the contents of the capsule using the HandiHaler device Inhalation Once a day     pravastatin  (PRAVACHOL ) 40 MG tablet Take 40 mg by mouth daily. (Patient not taking: Reported on 09/16/2024)     No current facility-administered medications on file prior to visit.  [2]  Allergies Allergen Reactions   Heparin (Bovine)  Anaphylaxis    Other reaction(s): Shock (ALLERGY)  ?Coma  Other reaction(s): Shock (ALLERGY) ?Coma ?Coma Other reaction(s): Shock (ALLERGY) ?Coma    ?Coma   Codeine Other (See Comments)    Breaks out into a sweat & rash  Reaction: Sweating (intolerance); Breaks out into a sweat & rash   Heparin Other (See Comments)    Respiratory failure  Other Reaction(s): respiratory failure   Penicillin G Other (See Comments)   Penicillins Other (See Comments) and Rash    Other reaction(s): Diaphoresis / Sweating (intolerance)  Has patient had a PCN reaction causing immediate rash, facial/tongue/throat swelling, SOB or lightheadedness with hypotension: Yes  Has patient had a PCN reaction causing severe rash involving mucus membranes or skin necrosis: No  Has patient had a PCN reaction that required hospitalization No  Has patient had a PCN reaction occurring within the last 10 years: No  If all of the above answers are NO, then may proceed with Cephalosporin use.  Other Reaction(s): sweating  Reaction: Diaphoresis /  Sweating (intolerance)   Other reaction(s): Diaphoresis / Sweating (intolerance)  Has patient had a PCN reaction causing immediate rash, facial/tongue/throat swelling, SOB or lightheadedness with hypotension: Yes  Has patient had a PCN reaction causing severe rash involving mucus membranes or skin necrosis: No  Has patient had a PCN reaction that required hospitalization No  Has patient had a PCN reaction occurring within the last 10 years: No  If all of the above answers are NO, then may proceed with Cephalosporin use.    Reaction: Diaphoresis / Sweating (intolerance)   Tape Itching and Rash   Wound Dressing Adhesive Itching and Rash   "

## 2024-09-22 ENCOUNTER — Telehealth: Payer: Self-pay | Admitting: Cardiovascular Disease

## 2024-09-22 ENCOUNTER — Telehealth: Payer: Self-pay

## 2024-09-22 ENCOUNTER — Ambulatory Visit
Admission: RE | Admit: 2024-09-22 | Discharge: 2024-09-22 | Disposition: A | Discharge status: Home / Self Care | Source: Ambulatory Visit | Attending: Neurology | Admitting: Neurology

## 2024-09-22 DIAGNOSIS — R059 Cough, unspecified: Secondary | ICD-10-CM

## 2024-09-22 DIAGNOSIS — G7000 Myasthenia gravis without (acute) exacerbation: Secondary | ICD-10-CM

## 2024-09-22 MED ORDER — IOPAMIDOL (ISOVUE-300) INJECTION 61%
75.0000 mL | Freq: Once | INTRAVENOUS | Status: AC | PRN
  Administered 2024-09-22: 75 mL via INTRAVENOUS

## 2024-09-22 NOTE — Telephone Encounter (Signed)
 [facility] called and wanted to ask if Dr. Tobie wanted CT with contrast.  They want Dr. Tobie to be aware there is a Low risk of symptoms getting worse after receiving contrast by 6%. Radiologist wanted to reach out to confirm if Dr. Tobie wanted with contrast or if she would lie to change to w/o contrast due to 6% risk.   Per Dr. Tobie she would like CT WITH CONTRAST. [facility] has been informed of this.

## 2024-09-22 NOTE — Telephone Encounter (Signed)
 Spoke with patient wife via telephone call. Wife states patient was held on pravastatin  and metoprolol  related to possible myasthenia gravis. Since last visit patient has visited neurology and got an actual diagnosis for myasthenia gravis and wanted Dr Gollan to be aware. Wife states her and patient want him to get back on something for cholesterol if he needs it, states he is only taking the zetia  for cholesterol at the moment. Neurologist also added back metoprolol  (lopressor ) to patient regimen r/t high blood pressure, patient also bradycardia. She wants to know if cholesterol medication should be changed and if metoprolol  should be changed to something else for the blood pressure..  Wife also wanted to know who lifts the ban on driving for the patient since he was banned for 6 months post cardiac arrest and ICD placement. Told patient to reach out to Dr Idelle and also [employer] for more information on driving restriction and any forms that may need to be filled out by electrophysiology.   Wife verbalized understanding, no further questions at this time.

## 2024-09-22 NOTE — Telephone Encounter (Signed)
 Pt c/o medication issue:  1. Name of Medication: pravastatin  (PRAVACHOL ) 40 MG   2. How are you currently taking this medication (dosage and times per day)? N/A  3. Are you having a reaction (difficulty breathing--STAT)? No  4. What is your medication issue? Pts wife would like to know if the pt should start this medication again or the pt would like a recommendations for a different statin. The pts has also started taking metoprolol  tartrate (LOPRESSOR ) 25 MG tablet again after being told to stop.

## 2024-09-23 ENCOUNTER — Ambulatory Visit: Payer: Self-pay | Admitting: Neurology

## 2024-09-23 NOTE — Telephone Encounter (Signed)
 Called and spoke with patient. Informed patient of the following recommendations from Dr. Gollan.  Would stay away from statins given potential to exacerbate his myasthenia gravis symptoms   Would suggest he start Repatha  140 subcu every 2 weeks stay on Zetia  Thx TG  Patient states that he was prescribed Repatha  in the past but was unable to afford it. Will forward medication assistance team for recommendations.

## 2024-09-26 ENCOUNTER — Other Ambulatory Visit: Payer: Self-pay | Admitting: Emergency Medicine

## 2024-09-26 ENCOUNTER — Telehealth: Payer: Self-pay | Admitting: Pharmacy Technician

## 2024-09-26 ENCOUNTER — Other Ambulatory Visit (HOSPITAL_COMMUNITY): Payer: Self-pay

## 2024-09-26 MED ORDER — PRALUENT 75 MG/ML ~~LOC~~ SOAJ: 75.0000 mg | SUBCUTANEOUS | 3 refills | Status: AC

## 2024-09-26 NOTE — Telephone Encounter (Signed)
 Spoke with wife per DPR. Wife states that she spoke with the pharmacy and Praluent  will be $370. Per wife the pharmacy is unaware of a grant. Will forward to RX assistance.

## 2024-09-26 NOTE — Telephone Encounter (Signed)
 Called and notified patient. Prescription sent to preferred pharmacy.

## 2024-09-26 NOTE — Telephone Encounter (Signed)
 Pharmacy Patient Advocate Encounter   Received notification from Pt Calls Messages that prior authorization for repatha /PRALUENT  is required/requested.   Insurance verification completed.   The patient is insured through [facility].   Per test claim: PA required; PA submitted to above mentioned insurance via Latent Key/confirmation #/EOC [id number] Status is pending   Insurance wants praluent  tried and failed first  273.88 on transition fill on insurance due to deductible  Patient Advocate Encounter   The patient was approved for a [employer] that will help cover the cost of repatha /praluent  Total amount awarded, 2500.  Effective: 08/27/24 - 08/26/25   APW:[id number] ERW:[id number] Hmnle:[id number] PI:[id number] [employer] ID: [id number]

## 2024-09-27 ENCOUNTER — Other Ambulatory Visit

## 2024-09-28 ENCOUNTER — Telehealth: Payer: Self-pay | Admitting: Student in an Organized Health Care Education/Training Program

## 2024-09-28 NOTE — Telephone Encounter (Signed)
 Wife calling to get a letter stating its okay for the patient to drive again. Please advise

## 2024-09-30 ENCOUNTER — Ambulatory Visit: Admitting: Cardiovascular Disease

## 2024-09-30 NOTE — Telephone Encounter (Signed)
 Wife is calling to check status of message.Please advise

## 2024-10-03 ENCOUNTER — Encounter

## 2024-10-11 ENCOUNTER — Ambulatory Visit: Admitting: Pulmonary Disease

## 2024-11-02 ENCOUNTER — Ambulatory Visit: Payer: Self-pay | Admitting: Neurology

## 2024-11-23 ENCOUNTER — Encounter

## 2025-02-22 ENCOUNTER — Encounter

## 2025-05-24 ENCOUNTER — Encounter
